# Patient Record
Sex: Female | Born: 1975 | Race: Black or African American | Hispanic: No | Marital: Single | Smoking: Current every day smoker
Health system: Southern US, Community
[De-identification: ages and names within clinical notes are randomized; demographics above are authoritative.]

## PROBLEM LIST (undated history)

## (undated) DIAGNOSIS — E119 Type 2 diabetes mellitus without complications: Secondary | ICD-10-CM

## (undated) DIAGNOSIS — F32A Depression, unspecified: Secondary | ICD-10-CM

## (undated) DIAGNOSIS — R7303 Prediabetes: Secondary | ICD-10-CM

## (undated) DIAGNOSIS — F329 Major depressive disorder, single episode, unspecified: Secondary | ICD-10-CM

## (undated) DIAGNOSIS — N63 Unspecified lump in unspecified breast: Secondary | ICD-10-CM

## (undated) DIAGNOSIS — M51369 Other intervertebral disc degeneration, lumbar region without mention of lumbar back pain or lower extremity pain: Secondary | ICD-10-CM

## (undated) DIAGNOSIS — J42 Unspecified chronic bronchitis: Secondary | ICD-10-CM

## (undated) DIAGNOSIS — M75110 Incomplete rotator cuff tear or rupture of unspecified shoulder, not specified as traumatic: Secondary | ICD-10-CM

## (undated) DIAGNOSIS — M5136 Other intervertebral disc degeneration, lumbar region: Secondary | ICD-10-CM

## (undated) DIAGNOSIS — F419 Anxiety disorder, unspecified: Secondary | ICD-10-CM

## (undated) DIAGNOSIS — M5126 Other intervertebral disc displacement, lumbar region: Secondary | ICD-10-CM

## (undated) DIAGNOSIS — G43909 Migraine, unspecified, not intractable, without status migrainosus: Secondary | ICD-10-CM

## (undated) DIAGNOSIS — I10 Essential (primary) hypertension: Secondary | ICD-10-CM

## (undated) HISTORY — DX: Prediabetes: R73.03

## (undated) HISTORY — DX: Depression, unspecified: F32.A

## (undated) HISTORY — PX: TUBAL LIGATION: SHX77

## (undated) HISTORY — DX: Other intervertebral disc degeneration, lumbar region without mention of lumbar back pain or lower extremity pain: M51.369

## (undated) HISTORY — DX: Major depressive disorder, single episode, unspecified: F32.9

## (undated) HISTORY — DX: Other intervertebral disc degeneration, lumbar region: M51.36

## (undated) HISTORY — PX: SALPINGECTOMY: SHX328

## (undated) HISTORY — DX: Incomplete rotator cuff tear or rupture of unspecified shoulder, not specified as traumatic: M75.110

## (undated) HISTORY — PX: ANKLE SURGERY: SHX546

## (undated) HISTORY — DX: Other intervertebral disc displacement, lumbar region: M51.26

## (undated) HISTORY — PX: BREAST CYST ASPIRATION: SHX578

## (undated) HISTORY — PX: OTHER SURGICAL HISTORY: SHX169

## (undated) HISTORY — DX: Migraine, unspecified, not intractable, without status migrainosus: G43.909

---

## 2004-09-28 ENCOUNTER — Emergency Department: Payer: Self-pay | Admitting: General Practice

## 2004-10-29 ENCOUNTER — Emergency Department: Payer: Self-pay | Admitting: Unknown Physician Specialty

## 2004-11-02 ENCOUNTER — Ambulatory Visit: Payer: Self-pay | Admitting: Obstetrics and Gynecology

## 2004-11-16 ENCOUNTER — Ambulatory Visit: Payer: Self-pay | Admitting: Obstetrics and Gynecology

## 2005-02-25 ENCOUNTER — Emergency Department: Payer: Self-pay | Admitting: Emergency Medicine

## 2005-02-28 ENCOUNTER — Emergency Department: Payer: Self-pay | Admitting: Emergency Medicine

## 2005-03-13 ENCOUNTER — Inpatient Hospital Stay: Payer: Self-pay | Admitting: General Practice

## 2005-04-09 ENCOUNTER — Emergency Department: Payer: Self-pay | Admitting: General Practice

## 2005-07-16 ENCOUNTER — Emergency Department: Payer: Self-pay | Admitting: Emergency Medicine

## 2006-01-06 ENCOUNTER — Emergency Department: Payer: Self-pay | Admitting: Emergency Medicine

## 2006-02-12 ENCOUNTER — Emergency Department: Payer: Self-pay | Admitting: Emergency Medicine

## 2006-03-28 ENCOUNTER — Emergency Department: Payer: Self-pay | Admitting: General Practice

## 2006-06-23 ENCOUNTER — Emergency Department: Payer: Self-pay | Admitting: Emergency Medicine

## 2006-06-27 ENCOUNTER — Emergency Department: Payer: Self-pay | Admitting: Emergency Medicine
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
## 2007-01-31 ENCOUNTER — Emergency Department: Payer: Self-pay | Admitting: Emergency Medicine

## 2007-04-27 ENCOUNTER — Emergency Department: Payer: Self-pay

## 2007-05-01 ENCOUNTER — Emergency Department: Payer: Self-pay

## 2007-05-03 ENCOUNTER — Inpatient Hospital Stay: Payer: Self-pay

## 2007-05-03 ENCOUNTER — Ambulatory Visit: Payer: Self-pay | Admitting: Unknown Physician Specialty

## 2007-07-13 ENCOUNTER — Emergency Department: Payer: Self-pay | Admitting: Emergency Medicine

## 2007-08-28 ENCOUNTER — Emergency Department: Payer: Self-pay | Admitting: Emergency Medicine

## 2007-08-29 ENCOUNTER — Emergency Department: Payer: Self-pay | Admitting: Emergency Medicine

## 2007-11-29 ENCOUNTER — Emergency Department: Payer: Self-pay | Admitting: Emergency Medicine

## 2007-12-18 ENCOUNTER — Emergency Department: Payer: Self-pay | Admitting: Emergency Medicine

## 2007-12-26 ENCOUNTER — Emergency Department: Payer: Self-pay | Admitting: Unknown Physician Specialty

## 2008-02-11 ENCOUNTER — Emergency Department: Payer: Self-pay | Admitting: Emergency Medicine

## 2008-07-07 ENCOUNTER — Emergency Department: Payer: Self-pay | Admitting: Emergency Medicine

## 2008-08-06 ENCOUNTER — Emergency Department: Payer: Self-pay | Admitting: Emergency Medicine

## 2008-09-08 ENCOUNTER — Emergency Department: Payer: Self-pay | Admitting: Emergency Medicine

## 2008-09-22 ENCOUNTER — Emergency Department: Payer: Self-pay | Admitting: Emergency Medicine

## 2008-10-04 ENCOUNTER — Emergency Department: Payer: Self-pay | Admitting: Emergency Medicine

## 2008-10-29 ENCOUNTER — Emergency Department: Payer: Self-pay | Admitting: Emergency Medicine

## 2008-10-30 ENCOUNTER — Emergency Department: Payer: Self-pay | Admitting: Emergency Medicine

## 2008-12-31 ENCOUNTER — Emergency Department: Payer: Self-pay | Admitting: Emergency Medicine

## 2009-02-17 ENCOUNTER — Emergency Department: Payer: Self-pay | Admitting: Emergency Medicine

## 2009-04-28 ENCOUNTER — Emergency Department: Payer: Self-pay | Admitting: Emergency Medicine

## 2009-05-01 ENCOUNTER — Emergency Department: Payer: Self-pay | Admitting: Emergency Medicine

## 2009-06-08 ENCOUNTER — Emergency Department: Payer: Self-pay | Admitting: Emergency Medicine

## 2009-06-24 ENCOUNTER — Emergency Department: Payer: Self-pay | Admitting: Emergency Medicine

## 2009-08-05 ENCOUNTER — Emergency Department: Payer: Self-pay | Admitting: Emergency Medicine

## 2009-09-06 ENCOUNTER — Emergency Department: Payer: Self-pay | Admitting: Emergency Medicine

## 2010-01-22 ENCOUNTER — Emergency Department: Payer: Self-pay | Admitting: Emergency Medicine

## 2010-02-02 ENCOUNTER — Emergency Department: Payer: Self-pay | Admitting: Emergency Medicine

## 2010-02-14 ENCOUNTER — Emergency Department: Payer: Self-pay | Admitting: Internal Medicine

## 2010-02-23 ENCOUNTER — Emergency Department: Payer: Self-pay | Admitting: Emergency Medicine

## 2010-02-26 ENCOUNTER — Emergency Department: Payer: Self-pay | Admitting: Emergency Medicine

## 2010-03-10 ENCOUNTER — Emergency Department: Payer: Self-pay | Admitting: Internal Medicine

## 2010-04-21 ENCOUNTER — Emergency Department: Payer: Self-pay | Admitting: Emergency Medicine

## 2011-01-01 ENCOUNTER — Emergency Department: Payer: Self-pay | Admitting: Emergency Medicine

## 2011-03-25 ENCOUNTER — Emergency Department: Payer: Self-pay | Admitting: Unknown Physician Specialty

## 2011-06-22 ENCOUNTER — Emergency Department: Payer: Self-pay | Admitting: Emergency Medicine
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
## 2012-05-03 ENCOUNTER — Emergency Department: Payer: Self-pay | Admitting: Emergency Medicine

## 2012-07-19 ENCOUNTER — Emergency Department: Payer: Self-pay | Admitting: Emergency Medicine

## 2012-09-25 ENCOUNTER — Emergency Department: Payer: Self-pay | Admitting: Emergency Medicine

## 2012-10-27 ENCOUNTER — Ambulatory Visit: Payer: Self-pay | Admitting: Nurse Practitioner

## 2013-01-18 ENCOUNTER — Emergency Department: Payer: Self-pay | Admitting: Emergency Medicine

## 2013-02-12 ENCOUNTER — Emergency Department: Payer: Self-pay | Admitting: Emergency Medicine

## 2013-07-19 ENCOUNTER — Emergency Department: Payer: Self-pay | Admitting: Emergency Medicine

## 2013-08-07 ENCOUNTER — Emergency Department: Payer: Self-pay | Admitting: Emergency Medicine

## 2013-09-04 ENCOUNTER — Emergency Department: Payer: Self-pay | Admitting: Emergency Medicine

## 2013-09-04 LAB — URINALYSIS, COMPLETE
Bacteria: NONE SEEN
Bilirubin,UR: NEGATIVE
Glucose,UR: NEGATIVE mg/dL (ref 0–75)
Ketone: NEGATIVE
Nitrite: NEGATIVE
Specific Gravity: 1.027 (ref 1.003–1.030)
Squamous Epithelial: 4
WBC UR: 4 /HPF (ref 0–5)

## 2013-09-07 ENCOUNTER — Emergency Department: Payer: Self-pay | Admitting: Emergency Medicine

## 2013-09-13 ENCOUNTER — Emergency Department: Payer: Self-pay | Admitting: Emergency Medicine

## 2013-12-10 ENCOUNTER — Emergency Department: Payer: Self-pay | Admitting: Emergency Medicine

## 2014-04-10 ENCOUNTER — Emergency Department: Payer: Self-pay | Admitting: Internal Medicine

## 2014-04-10 LAB — CBC
HCT: 41.2 % (ref 35.0–47.0)
HGB: 13.3 g/dL (ref 12.0–16.0)
MCH: 29.6 pg (ref 26.0–34.0)
MCHC: 32.3 g/dL (ref 32.0–36.0)
MCV: 92 fL (ref 80–100)
PLATELETS: 443 10*3/uL — AB (ref 150–440)
RBC: 4.51 10*6/uL (ref 3.80–5.20)
RDW: 12.8 % (ref 11.5–14.5)
WBC: 10.6 10*3/uL (ref 3.6–11.0)

## 2014-04-10 LAB — BASIC METABOLIC PANEL
Anion Gap: 3 — ABNORMAL LOW (ref 7–16)
BUN: 8 mg/dL (ref 7–18)
CALCIUM: 8.4 mg/dL — AB (ref 8.5–10.1)
Chloride: 107 mmol/L (ref 98–107)
Co2: 27 mmol/L (ref 21–32)
Creatinine: 0.85 mg/dL (ref 0.60–1.30)
EGFR (African American): >60
EGFR (Non-African Amer.): >60
Glucose: 102 mg/dL — ABNORMAL HIGH (ref 65–99)
Osmolality: 272 (ref 275–301)
POTASSIUM: 4.2 mmol/L (ref 3.5–5.1)
Sodium: 137 mmol/L (ref 136–145)

## 2014-04-10 LAB — TROPONIN I: Troponin-I: <0.02 ng/mL

## 2014-04-25 DIAGNOSIS — M75101 Unspecified rotator cuff tear or rupture of right shoulder, not specified as traumatic: Secondary | ICD-10-CM | POA: Insufficient documentation

## 2014-04-25 DIAGNOSIS — M67919 Unspecified disorder of synovium and tendon, unspecified shoulder: Secondary | ICD-10-CM | POA: Insufficient documentation

## 2014-05-15 ENCOUNTER — Emergency Department: Payer: Self-pay | Admitting: Emergency Medicine

## 2014-05-22 IMAGING — CR SACRUM AND COCCYX - 2+ VIEW
1 series · 5 of 5 positions shown · non-contrast
Comparison: none

REASON FOR EXAM: PAIN POST FALL
COMMENTS:

PROCEDURE:     DXR - DXR SACRUM AND COCCYX  - September 25, 2012  [DATE]
RESULT:     Comparison: None.

[Series 1: ap · 0.17mm/px · 5 of 5 slices shown]
[im 1/5]
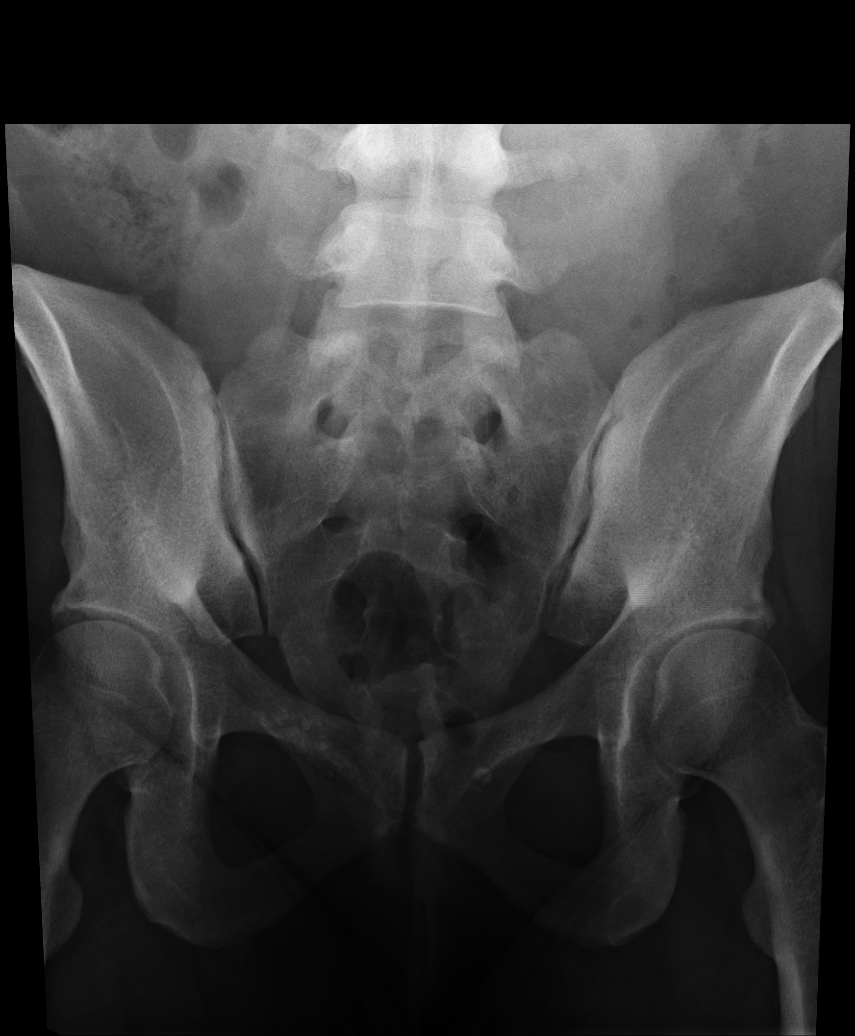
[im 2/5]
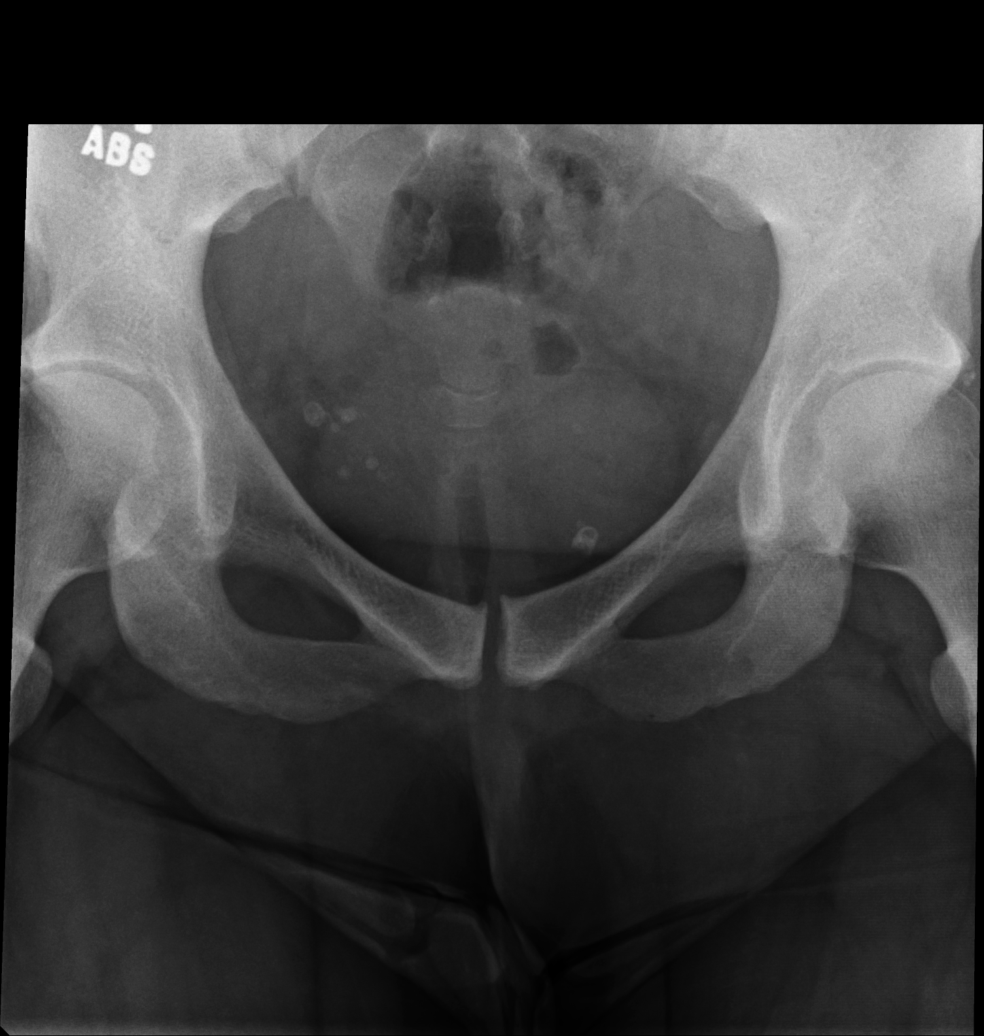
[im 3/5]
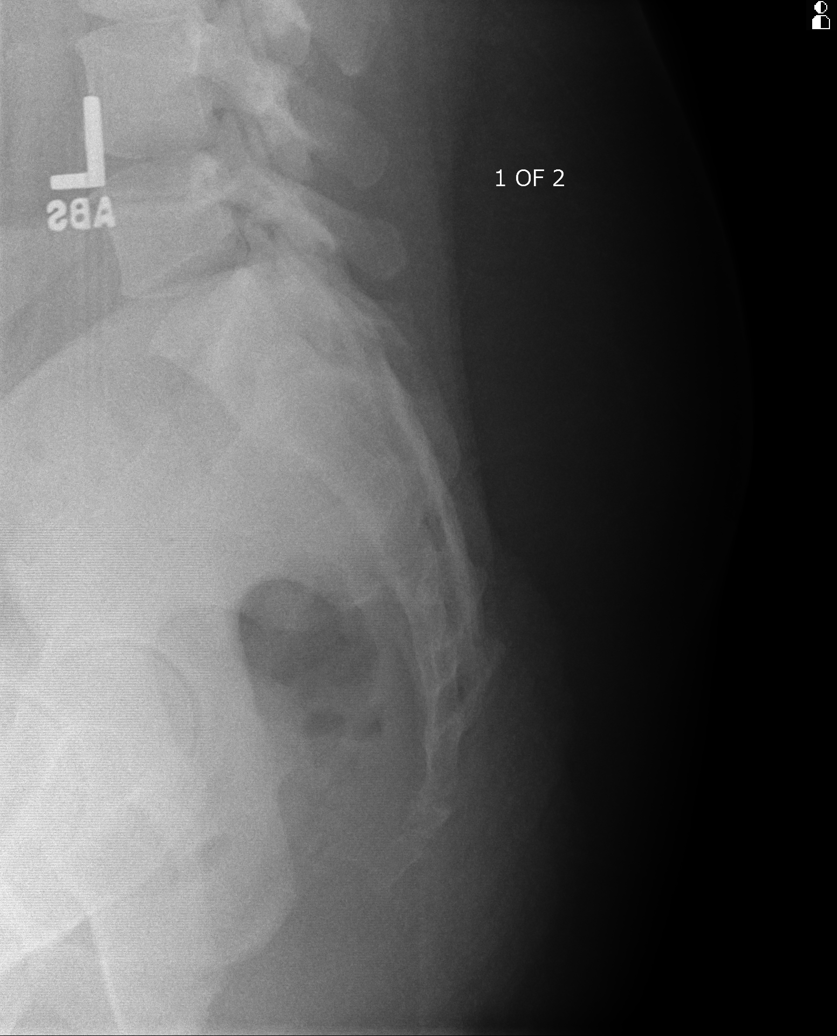
[im 4/5]
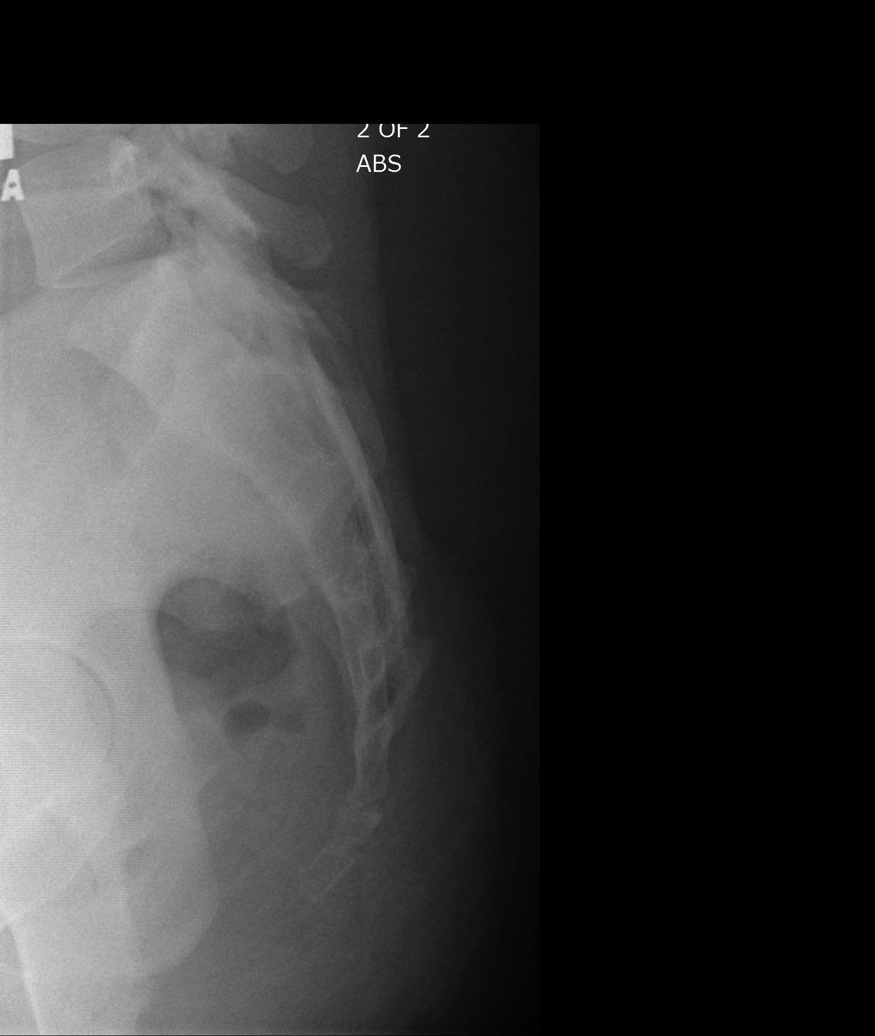
[im 5/5]
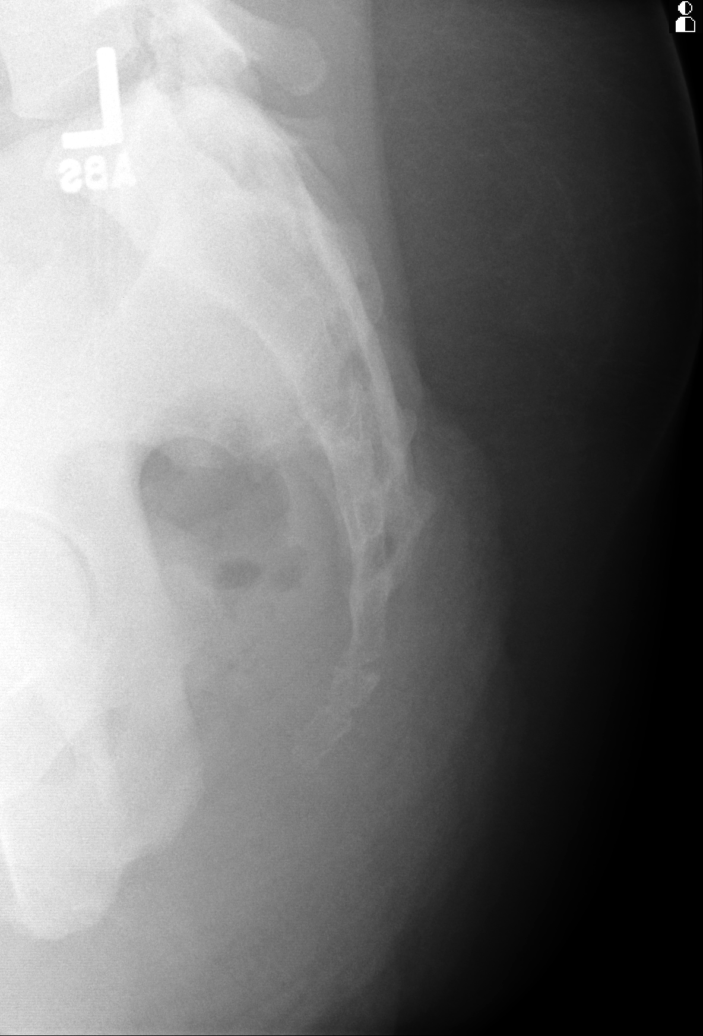

[5 of 5 positions shown; findings below may reference images not displayed]

FINDINGS: The sacroiliac joints are patent. No displaced fracture seen.
IMPRESSION: No displaced fracture seen.

[REDACTED]

## 2014-05-29 ENCOUNTER — Emergency Department: Payer: Self-pay | Admitting: Emergency Medicine

## 2014-07-29 ENCOUNTER — Emergency Department: Payer: Self-pay | Admitting: Emergency Medicine

## 2014-09-03 ENCOUNTER — Emergency Department: Payer: Self-pay | Admitting: Emergency Medicine

## 2014-09-03 LAB — URINALYSIS, COMPLETE
BILIRUBIN, UR: NEGATIVE
BLOOD: NEGATIVE
GLUCOSE, UR: NEGATIVE mg/dL (ref 0–75)
KETONE: NEGATIVE
NITRITE: NEGATIVE
Ph: 6 (ref 4.5–8.0)
Protein: NEGATIVE
Specific Gravity: 1.018 (ref 1.003–1.030)
Squamous Epithelial: 1

## 2014-09-03 LAB — COMPREHENSIVE METABOLIC PANEL
Albumin: 3.2 g/dL — ABNORMAL LOW (ref 3.4–5.0)
Alkaline Phosphatase: 116 U/L
Anion Gap: 6 — ABNORMAL LOW (ref 7–16)
BILIRUBIN TOTAL: 0.2 mg/dL (ref 0.2–1.0)
BUN: 8 mg/dL (ref 7–18)
Calcium, Total: 8.7 mg/dL (ref 8.5–10.1)
Chloride: 107 mmol/L (ref 98–107)
Co2: 27 mmol/L (ref 21–32)
Creatinine: 1.07 mg/dL (ref 0.60–1.30)
EGFR (Non-African Amer.): >60
GLUCOSE: 118 mg/dL — AB (ref 65–99)
Osmolality: 279 (ref 275–301)
POTASSIUM: 4.1 mmol/L (ref 3.5–5.1)
SGOT(AST): 26 U/L (ref 15–37)
SGPT (ALT): 27 U/L
Sodium: 140 mmol/L (ref 136–145)
Total Protein: 6.9 g/dL (ref 6.4–8.2)

## 2014-09-03 LAB — CBC WITH DIFFERENTIAL/PLATELET
Basophil #: 0.2 10*3/uL — ABNORMAL HIGH (ref 0.0–0.1)
Basophil %: 1.2 %
EOS PCT: 2 %
Eosinophil #: 0.2 10*3/uL (ref 0.0–0.7)
HCT: 43.1 % (ref 35.0–47.0)
HGB: 13.7 g/dL (ref 12.0–16.0)
LYMPHS ABS: 2.9 10*3/uL (ref 1.0–3.6)
Lymphocyte %: 23.1 %
MCH: 29.5 pg (ref 26.0–34.0)
MCHC: 31.7 g/dL — ABNORMAL LOW (ref 32.0–36.0)
MCV: 93 fL (ref 80–100)
MONO ABS: 0.6 x10 3/mm (ref 0.2–0.9)
Monocyte %: 5.2 %
Neutrophil #: 8.6 10*3/uL — ABNORMAL HIGH (ref 1.4–6.5)
Neutrophil %: 68.5 %
PLATELETS: 554 10*3/uL — AB (ref 150–440)
RBC: 4.65 10*6/uL (ref 3.80–5.20)
RDW: 13.4 % (ref 11.5–14.5)
WBC: 12.5 10*3/uL — AB (ref 3.6–11.0)

## 2014-09-17 ENCOUNTER — Emergency Department: Payer: Self-pay | Admitting: Emergency Medicine

## 2014-09-17 LAB — COMPREHENSIVE METABOLIC PANEL
ALBUMIN: 3.1 g/dL — AB (ref 3.4–5.0)
ALT: 19 U/L
Alkaline Phosphatase: 100 U/L
Anion Gap: 6 — ABNORMAL LOW (ref 7–16)
BILIRUBIN TOTAL: 0.3 mg/dL (ref 0.2–1.0)
BUN: 6 mg/dL — AB (ref 7–18)
CO2: 24 mmol/L (ref 21–32)
Calcium, Total: 8.3 mg/dL — ABNORMAL LOW (ref 8.5–10.1)
Chloride: 110 mmol/L — ABNORMAL HIGH (ref 98–107)
Creatinine: 0.83 mg/dL (ref 0.60–1.30)
EGFR (African American): >60
EGFR (Non-African Amer.): >60
GLUCOSE: 120 mg/dL — AB (ref 65–99)
OSMOLALITY: 278 (ref 275–301)
Potassium: 4 mmol/L (ref 3.5–5.1)
SGOT(AST): 24 U/L (ref 15–37)
Sodium: 140 mmol/L (ref 136–145)
TOTAL PROTEIN: 6.8 g/dL (ref 6.4–8.2)

## 2014-09-17 LAB — URINALYSIS, COMPLETE
BILIRUBIN, UR: NEGATIVE
Glucose,UR: NEGATIVE mg/dL (ref 0–75)
KETONE: NEGATIVE
Nitrite: NEGATIVE
Ph: 5 (ref 4.5–8.0)
Protein: 30
SPECIFIC GRAVITY: 1.02 (ref 1.003–1.030)
Squamous Epithelial: 1
WBC UR: 6 /HPF (ref 0–5)

## 2014-09-17 LAB — CBC
HCT: 41.4 % (ref 35.0–47.0)
HGB: 13.4 g/dL (ref 12.0–16.0)
MCH: 29.9 pg (ref 26.0–34.0)
MCHC: 32.4 g/dL (ref 32.0–36.0)
MCV: 92 fL (ref 80–100)
Platelet: 437 10*3/uL (ref 150–440)
RBC: 4.49 10*6/uL (ref 3.80–5.20)
RDW: 13.4 % (ref 11.5–14.5)
WBC: 10.4 10*3/uL (ref 3.6–11.0)

## 2014-09-17 LAB — LIPASE, BLOOD: Lipase: 103 U/L (ref 73–393)

## 2014-10-23 ENCOUNTER — Emergency Department: Payer: Self-pay | Admitting: Emergency Medicine

## 2014-11-05 ENCOUNTER — Emergency Department: Payer: Self-pay | Admitting: Student

## 2014-12-17 ENCOUNTER — Emergency Department: Payer: Self-pay | Admitting: Emergency Medicine

## 2015-01-26 ENCOUNTER — Emergency Department
Admission: EM | Admit: 2015-01-26 | Discharge: 2015-01-26 | Disposition: A | Discharge status: Home / Self Care | Payer: Medicaid Other | Attending: Emergency Medicine | Admitting: Emergency Medicine

## 2015-01-26 ENCOUNTER — Encounter: Payer: Self-pay | Admitting: Emergency Medicine

## 2015-01-26 DIAGNOSIS — Z720 Tobacco use: Secondary | ICD-10-CM | POA: Diagnosis not present

## 2015-01-26 DIAGNOSIS — E119 Type 2 diabetes mellitus without complications: Secondary | ICD-10-CM | POA: Diagnosis not present

## 2015-01-26 DIAGNOSIS — K029 Dental caries, unspecified: Secondary | ICD-10-CM | POA: Insufficient documentation

## 2015-01-26 DIAGNOSIS — I10 Essential (primary) hypertension: Secondary | ICD-10-CM | POA: Insufficient documentation

## 2015-01-26 DIAGNOSIS — K088 Other specified disorders of teeth and supporting structures: Secondary | ICD-10-CM | POA: Diagnosis present

## 2015-01-26 HISTORY — DX: Type 2 diabetes mellitus without complications: E11.9

## 2015-01-26 HISTORY — DX: Essential (primary) hypertension: I10

## 2015-01-26 MED ORDER — LIDOCAINE VISCOUS 2 % MT SOLN: 20.0000 mL | OROMUCOSAL | Status: DC | PRN

## 2015-01-26 MED ORDER — IBUPROFEN 800 MG PO TABS: 800.0000 mg | ORAL_TABLET | Freq: Three times a day (TID) | ORAL | Status: DC | PRN

## 2015-01-26 MED ORDER — HYDROCODONE-ACETAMINOPHEN 5-325 MG PO TABS: 1.0000 | ORAL_TABLET | Freq: Four times a day (QID) | ORAL | Status: DC | PRN

## 2015-01-26 MED ORDER — AMOXICILLIN 500 MG PO TABS
500.0000 mg | ORAL_TABLET | Freq: Three times a day (TID) | ORAL | Status: DC
Start: 2015-01-26 — End: 2015-07-14

## 2015-01-26 NOTE — Discharge Instructions (Signed)
Dental Caries °Dental caries (also called tooth decay) is the most common oral disease. It can occur at any age but is more common in children and young adults.  °HOW DENTAL CARIES DEVELOPS  °The process of decay begins when bacteria and foods (particularly sugars and starches) combine in your mouth to produce plaque. Plaque is a substance that sticks to the hard, outer surface of a tooth (enamel). The bacteria in plaque produce acids that attack enamel. These acids may also attack the root surface of a tooth (cementum) if it is exposed. Repeated attacks dissolve these surfaces and create holes in the tooth (cavities). If left untreated, the acids destroy the other layers of the tooth.  °RISK FACTORS °· Frequent sipping of sugary beverages.   °· Frequent snacking on sugary and starchy foods, especially those that easily get stuck in the teeth.   °· Poor oral hygiene.   °· Dry mouth.   °· Substance abuse such as methamphetamine abuse.   °· Broken or poor-fitting dental restorations.   °· Eating disorders.   °· Gastroesophageal reflux disease (GERD).   °· Certain radiation treatments to the head and neck. °SYMPTOMS °In the early stages of dental caries, symptoms are seldom present. Sometimes white, chalky areas may be seen on the enamel or other tooth layers. In later stages, symptoms may include: °· Pits and holes on the enamel. °· Toothache after sweet, hot, or cold foods or drinks are consumed. °· Pain around the tooth. °· Swelling around the tooth. °DIAGNOSIS  °Most of the time, dental caries is detected during a regular dental checkup. A diagnosis is made after a thorough medical and dental history is taken and the surfaces of your teeth are checked for signs of dental caries. Sometimes special instruments, such as lasers, are used to check for dental caries. Dental X-ray exams may be taken so that areas not visible to the eye (such as between the contact areas of the teeth) can be checked for cavities.    °TREATMENT  °If dental caries is in its early stages, it may be reversed with a fluoride treatment or an application of a remineralizing agent at the dental office. Thorough brushing and flossing at home is needed to aid these treatments. If it is in its later stages, treatment depends on the location and extent of tooth destruction:  °· If a small area of the tooth has been destroyed, the destroyed area will be removed and cavities will be filled with a material such as gold, silver amalgam, or composite resin.   °· If a large area of the tooth has been destroyed, the destroyed area will be removed and a cap (crown) will be fitted over the remaining tooth structure.   °· If the center part of the tooth (pulp) is affected, a procedure called a root canal will be needed before a filling or crown can be placed.   °· If most of the tooth has been destroyed, the tooth may need to be pulled (extracted). °HOME CARE INSTRUCTIONS °You can prevent, stop, or reverse dental caries at home by practicing good oral hygiene. Good oral hygiene includes: °· Thoroughly cleaning your teeth at least twice a day with a toothbrush and dental floss.   °· Using a fluoride toothpaste. A fluoride mouth rinse may also be used if recommended by your dentist or health care provider.   °· Restricting the amount of sugary and starchy foods and sugary liquids you consume.   °· Avoiding frequent snacking on these foods and sipping of these liquids.   °· Keeping regular visits with   a dentist for checkups and cleanings. PREVENTION   Practice good oral hygiene.  Consider a dental sealant. A dental sealant is a coating material that is applied by your dentist to the pits and grooves of teeth. The sealant prevents food from being trapped in them. It may protect the teeth for several years.  Ask about fluoride supplements if you live in a community without fluorinated water or with water that has a low fluoride content. Use fluoride supplements  as directed by your dentist or health care provider.  Allow fluoride varnish applications to teeth if directed by your dentist or health care provider. Document Released: 06/05/2002 Document Revised: 01/28/2014 Document Reviewed: 09/15/2012 [facility] Patient Information 2015 [place], [place]. This information is not intended to replace advice given to you by your health care provider. Make sure you discuss any questions you have with your health care provider.  OPTIONS FOR DENTAL FOLLOW UP CARE  [facility] and [facility] [email or web address] [phone])  [facility] [phone])  [facility] [phone])  [facility] [phone])  [facility] [phone]) This clinic caters to the indigent population and is on a lottery system. Location: [facility], [place], [place], [place] [facility] Hours: Wednesdays from 6pm - 9pm, patients seen by a lottery system. For dates, call or go to [email or web address] Services: Cleanings, fillings and simple extractions. Payment Options: DENTAL WORK IS FREE OF CHARGE. Bring proof of income or support. Best way to get seen: Arrive at 5:15 pm - this is a lottery, NOT first come/first serve, so arriving earlier will not increase your chances of being seen.     [facility] [phone] Select option 1 for emergencies   Location: [facility], [place], [address] [place] [facility] Hours: No walk-ins accepted - call the day before to schedule an appointment. Check in times are 9:30 am and 1:30 pm. Services: Simple extractions, temporary fillings, pulpectomy/pulp debridement, uncomplicated abscess drainage. Payment Options: PAYMENT IS DUE AT THE TIME OF SERVICE.  Fee  is usually $100-200, additional surgical procedures (e.g. abscess drainage) may be extra. Cash, checks, Visa/MasterCard accepted.  Can file Medicaid if patient is covered for dental - patient should call case worker to check. No discount for [facility] patients. Best way to get seen: MUST call the day before and get onto the schedule. Can usually be seen the next 1-2 days. No walk-ins accepted.     [facility] [phone]   Location: [place], [place] [facility] Hours: M, W, Th, F 8am or 1:30pm, Tues 9a or 1:30 - first come/first served. Services: Simple extractions, temporary fillings, uncomplicated abscess drainage.  You do not need to be an [facility] resident. Payment Options: PAYMENT IS DUE AT THE TIME OF SERVICE. Dental insurance, otherwise sliding scale - bring proof of income or support. Depending on income and treatment needed, cost is usually $50-200. Best way to get seen: Arrive early as it is first come/first served.     [facility] [phone]   Location: [place] [facility] Hours: Mon-Thu 8a-5p Services: Most basic dental services including extractions and fillings. Payment Options: PAYMENT IS DUE AT THE TIME OF SERVICE. Sliding scale, up to 50% off - bring proof if income or support. Medicaid with dental option accepted. Best way to  get seen: Call to schedule an appointment, can usually be seen within 2 weeks OR they will try to see walk-ins - show up at [place] or 2p (you may have to wait).     [facility] [place] RESIDENTS ONLY   Location: [facility], [place] [address], [place], [place] [address] Clinic Hours: By appointment only. Monday - Thursday 8am-5pm, Friday 8am-12pm Services: Cleanings, fillings, extractions. Payment Options: PAYMENT IS DUE AT THE TIME OF SERVICE. Cash, Visa or MasterCard.  Sliding scale - $30 minimum per service. Best way to get seen: Come in to office, complete packet and make an appointment - need proof of income or support monies for each household member and proof of [facility] residence. Usually takes about a month to get in.     [facility] [phone]   Location: [address] [place] [facility] Hours: Walk-in Urgent Care Dental Services are offered Monday-Friday mornings only. The numbers of emergencies accepted daily is limited to the number of providers available. Maximum 15 - Mondays, Wednesdays & Thursdays Maximum 10 - Tuesdays & Fridays Services: You do not need to be a [facility] resident to be seen for a dental emergency. Emergencies are defined as pain, swelling, abnormal bleeding, or dental trauma. Walkins will receive x-rays if needed. NOTE: Dental cleaning is not an emergency. Payment Options: PAYMENT IS DUE AT THE TIME OF SERVICE. Minimum co-pay is $40.00 for uninsured patients. Minimum co-pay is $3.00 for Medicaid with dental coverage. Dental Insurance is accepted and must be presented at time of visit. Medicare does not cover dental. Forms of payment: Cash, credit card, checks. Best way to get seen: If not previously registered with the clinic, walk-in dental registration begins at 7:15 am and is on a first come/first serve basis. If previously registered with the clinic, call to make an appointment.     [facility] [place] ONLY   Location: [address], [place], [place] Clinic Hours: Mon-Thu 10a-2p Services: Extractions only! Payment Options: FREE (donations accepted) - bring proof of income or support Best way to get seen: Call and schedule an appointment OR come at 8am on the 1st Monday of every month (except for holidays) when it is first come/first served.     [facility] [phone]   Location: [place]  Hours: Friday mornings Services, Payment Options, Best way to get seen: Call for info

## 2015-01-26 NOTE — ED Provider Notes (Signed)
[facility] Emergency Department Provider Note    ____________________________________________  Time seen: 1240   I have reviewed the triage vital signs and the nursing notes.   HISTORY  Chief Complaint Dental Pain    HPI Brenda Simmons is a 39 y.o. female who complains of having pains to her right lower tooth which she fractured about 6 months ago. Complains that her jaw and tooth are hurting more onset about 1 day ago suddenly. Denies fever chills sore throat and runny nose congestion. Positive toothache facial pain. No swollen jaw or face noted. Worsened by heat cold and food. Relieved by nothing. Has not seen a dentist recently.    Past Medical History  Diagnosis Date  . Hypertension   . Diabetes mellitus without complication     There are no active problems to display for this patient.   Past Surgical History  Procedure Laterality Date  . Other surgical history Left ankle surgery    Current Outpatient Rx  Name  Route  Sig  Dispense  Refill  . amoxicillin (AMOXIL) 500 MG tablet   Oral   Take 1 tablet (500 mg total) by mouth 3 (three) times daily.   30 tablet   0   . HYDROcodone-acetaminophen (NORCO/VICODIN) 5-325 MG per tablet   Oral   Take 1 tablet by mouth every 6 (six) hours as needed for moderate pain.   12 tablet   0   . ibuprofen (ADVIL,MOTRIN) 800 MG tablet   Oral   Take 1 tablet (800 mg total) by mouth every 8 (eight) hours as needed.   30 tablet   0   . lidocaine (XYLOCAINE) 2 % solution   Mouth/Throat   Use as directed 20 mLs in the mouth or throat as needed for mouth pain.   100 mL   0     Allergies Review of patient's allergies indicates no known allergies.  No family history on file.  Social History History  Substance Use Topics  . Smoking status: Current Every Day Smoker -- 1.00 packs/day for 15 years    Types: Cigarettes  . Smokeless tobacco: Not on file  . Alcohol Use: Not on file    Review of  Systems    Denies any recent illness I problems denies any headache. No GI issues. denies any respiratory,  cardiovascular issues at this time as well. ____________________________________________   PHYSICAL EXAM:  VITAL SIGNS: ED Triage Vitals  Enc Vitals Group     BP 01/26/15 1157 139/84 mmHg     Pulse Rate 01/26/15 1157 70     Resp --      Temp 01/26/15 1157 99.3 F (37.4 C)     Temp Source 01/26/15 1157 Oral     SpO2 01/26/15 1157 98 %     Weight 01/26/15 1157 217 lb (98.431 kg)     Height 01/26/15 1157 5\' 4"  (1.626 m)     Head Cir --      Peak Flow --      Pain Score 01/26/15 1158 9     Pain Loc --      Pain Edu? --      Excl. in [place]? --      Physical exam stress alert. Head and neck negative pain on percussion over sinuses. No cervical lymphadenopathy. Negative neck mass. Mouth throat positive dental tenderness right lower tooth #819 with widespread dental decay. Voice normal. No drooling. No airway problems. No increased secretion of saliva. Cardiovascular normal  heart sounds normal. Respiratory no respiratory distress normal breath sounds. ____________________________________________   EKG  none  ____________________________________________    RADIOLOGY   none ____________________________________________   PROCEDURES  Procedure(s) performed: None  ____________________________________________   INITIAL IMPRESSION / ASSESSMENT AND PLAN / ED COURSE  Pertinent labs & imaging results that were available during my care of the patient were reviewed by me and considered in my medical decision making (see chart for details).  Dental caries  ____________________________________________   FINAL CLINICAL IMPRESSION(S) / ED DIAGNOSES  Final diagnoses:  Initial dental caries      Arlyss Repress, PA-C 01/29/15 1706  Lavonia Drafts, MD 01/30/15 [phone]

## 2015-02-17 ENCOUNTER — Encounter: Payer: Self-pay | Admitting: Urgent Care

## 2015-02-17 DIAGNOSIS — O99331 Smoking (tobacco) complicating pregnancy, first trimester: Secondary | ICD-10-CM | POA: Insufficient documentation

## 2015-02-17 DIAGNOSIS — O24111 Pre-existing diabetes mellitus, type 2, in pregnancy, first trimester: Secondary | ICD-10-CM | POA: Insufficient documentation

## 2015-02-17 DIAGNOSIS — O209 Hemorrhage in early pregnancy, unspecified: Secondary | ICD-10-CM | POA: Diagnosis not present

## 2015-02-17 DIAGNOSIS — F17210 Nicotine dependence, cigarettes, uncomplicated: Secondary | ICD-10-CM | POA: Insufficient documentation

## 2015-02-17 DIAGNOSIS — O10011 Pre-existing essential hypertension complicating pregnancy, first trimester: Secondary | ICD-10-CM | POA: Diagnosis not present

## 2015-02-17 DIAGNOSIS — Z3A01 Less than 8 weeks gestation of pregnancy: Secondary | ICD-10-CM | POA: Insufficient documentation

## 2015-02-17 LAB — URINALYSIS COMPLETE WITH MICROSCOPIC (ARMC ONLY)
Bacteria, UA: NONE SEEN
Bilirubin Urine: NEGATIVE
Glucose, UA: NEGATIVE mg/dL
Ketones, ur: NEGATIVE mg/dL
NITRITE: NEGATIVE
PH: 5 (ref 5.0–8.0)
Protein, ur: NEGATIVE mg/dL
Specific Gravity, Urine: 1.009 (ref 1.005–1.030)

## 2015-02-17 LAB — BASIC METABOLIC PANEL
Anion gap: 5 (ref 5–15)
BUN: 8 mg/dL (ref 6–20)
CALCIUM: 8.2 mg/dL — AB (ref 8.9–10.3)
CO2: 23 mmol/L (ref 22–32)
CREATININE: 0.87 mg/dL (ref 0.44–1.00)
Chloride: 106 mmol/L (ref 101–111)
GFR calc non Af Amer: >60 mL/min (ref >60)
Glucose, Bld: 100 mg/dL — ABNORMAL HIGH (ref 65–99)
POTASSIUM: 3.7 mmol/L (ref 3.5–5.1)
SODIUM: 134 mmol/L — AB (ref 135–145)

## 2015-02-17 LAB — CBC
HCT: 38.6 % (ref 35.0–47.0)
Hemoglobin: 12.2 g/dL (ref 12.0–16.0)
MCH: 28.3 pg (ref 26.0–34.0)
MCHC: 31.7 g/dL — ABNORMAL LOW (ref 32.0–36.0)
MCV: 89.5 fL (ref 80.0–100.0)
Platelets: 415 10*3/uL (ref 150–440)
RBC: 4.31 MIL/uL (ref 3.80–5.20)
RDW: 13.6 % (ref 11.5–14.5)
WBC: 13 10*3/uL — ABNORMAL HIGH (ref 3.6–11.0)

## 2015-02-17 LAB — HCG, QUANTITATIVE, PREGNANCY: hCG, Beta Chain, Quant, S: 4911 m[IU]/mL — ABNORMAL HIGH (ref <5)

## 2015-02-17 LAB — ABO/RH
ABO/RH(D): B POS
ABO/RH(D): B POS

## 2015-02-17 LAB — POCT PREGNANCY, URINE: Preg Test, Ur: POSITIVE — AB

## 2015-02-17 NOTE — ED Notes (Addendum)
Patient presents reporting that she is spotting. Just found out today that she was "[redacted] weeks pregnant" - no prenatal care as of yet. Patient reporting that she is a [id number]. Denies pain.

## 2015-02-18 ENCOUNTER — Emergency Department
Admission: EM | Admit: 2015-02-18 | Discharge: 2015-02-18 | Discharge status: Against Medical Advice | Payer: Medicaid Other | Attending: Emergency Medicine | Admitting: Emergency Medicine

## 2015-02-18 ENCOUNTER — Telehealth: Payer: Self-pay | Admitting: Emergency Medicine

## 2015-02-18 HISTORY — DX: Anxiety disorder, unspecified: F41.9

## 2015-02-18 NOTE — ED Notes (Signed)
0200 NA when called x3

## 2015-02-18 NOTE — ED Notes (Signed)
Left message with my phone number.

## 2015-02-28 ENCOUNTER — Encounter: Payer: Self-pay | Admitting: Emergency Medicine

## 2015-02-28 ENCOUNTER — Emergency Department
Admission: EM | Admit: 2015-02-28 | Discharge: 2015-02-28 | Disposition: A | Discharge status: Home / Self Care | Payer: Medicaid Other | Attending: Emergency Medicine | Admitting: Emergency Medicine

## 2015-02-28 DIAGNOSIS — I10 Essential (primary) hypertension: Secondary | ICD-10-CM | POA: Insufficient documentation

## 2015-02-28 DIAGNOSIS — Z720 Tobacco use: Secondary | ICD-10-CM | POA: Diagnosis not present

## 2015-02-28 DIAGNOSIS — Z792 Long term (current) use of antibiotics: Secondary | ICD-10-CM | POA: Diagnosis not present

## 2015-02-28 DIAGNOSIS — R1032 Left lower quadrant pain: Secondary | ICD-10-CM | POA: Diagnosis present

## 2015-02-28 DIAGNOSIS — N3090 Cystitis, unspecified without hematuria: Secondary | ICD-10-CM | POA: Diagnosis not present

## 2015-02-28 DIAGNOSIS — R112 Nausea with vomiting, unspecified: Secondary | ICD-10-CM | POA: Diagnosis not present

## 2015-02-28 DIAGNOSIS — E119 Type 2 diabetes mellitus without complications: Secondary | ICD-10-CM | POA: Diagnosis not present

## 2015-02-28 LAB — COMPREHENSIVE METABOLIC PANEL
ALBUMIN: 3.8 g/dL (ref 3.5–5.0)
ALK PHOS: 85 U/L (ref 38–126)
ALT: 14 U/L (ref 14–54)
AST: 18 U/L (ref 15–41)
Anion gap: 4 — ABNORMAL LOW (ref 5–15)
BILIRUBIN TOTAL: 0.3 mg/dL (ref 0.3–1.2)
BUN: 10 mg/dL (ref 6–20)
CO2: 26 mmol/L (ref 22–32)
Calcium: 8.6 mg/dL — ABNORMAL LOW (ref 8.9–10.3)
Chloride: 106 mmol/L (ref 101–111)
Creatinine, Ser: 0.96 mg/dL (ref 0.44–1.00)
GFR calc Af Amer: >60 mL/min (ref >60)
GFR calc non Af Amer: >60 mL/min (ref >60)
Glucose, Bld: 116 mg/dL — ABNORMAL HIGH (ref 65–99)
Potassium: 4.2 mmol/L (ref 3.5–5.1)
Sodium: 136 mmol/L (ref 135–145)
Total Protein: 7.3 g/dL (ref 6.5–8.1)

## 2015-02-28 LAB — URINALYSIS COMPLETE WITH MICROSCOPIC (ARMC ONLY)
BILIRUBIN URINE: NEGATIVE
GLUCOSE, UA: NEGATIVE mg/dL
KETONES UR: NEGATIVE mg/dL
NITRITE: NEGATIVE
Protein, ur: 30 mg/dL — AB
Specific Gravity, Urine: 1.019 (ref 1.005–1.030)
pH: 5 (ref 5.0–8.0)

## 2015-02-28 LAB — CBC WITH DIFFERENTIAL/PLATELET
Basophils Absolute: 0.1 10*3/uL (ref 0–0.1)
Basophils Relative: 1 %
EOS PCT: 2 %
Eosinophils Absolute: 0.2 10*3/uL (ref 0–0.7)
HCT: 41.3 % (ref 35.0–47.0)
Hemoglobin: 13.3 g/dL (ref 12.0–16.0)
LYMPHS PCT: 19 %
Lymphs Abs: 2.2 10*3/uL (ref 1.0–3.6)
MCH: 29.1 pg (ref 26.0–34.0)
MCHC: 32.3 g/dL (ref 32.0–36.0)
MCV: 90.1 fL (ref 80.0–100.0)
MONO ABS: 0.6 10*3/uL (ref 0.2–0.9)
Monocytes Relative: 5 %
Neutro Abs: 8.8 10*3/uL — ABNORMAL HIGH (ref 1.4–6.5)
Neutrophils Relative %: 73 %
Platelets: 494 10*3/uL — ABNORMAL HIGH (ref 150–440)
RBC: 4.58 MIL/uL (ref 3.80–5.20)
RDW: 13.4 % (ref 11.5–14.5)
WBC: 12 10*3/uL — AB (ref 3.6–11.0)

## 2015-02-28 MED ORDER — PHENAZOPYRIDINE HCL 200 MG PO TABS: 200.0000 mg | ORAL_TABLET | Freq: Three times a day (TID) | ORAL | Status: DC | PRN

## 2015-02-28 NOTE — ED Notes (Signed)
Pt discharged home after verbalizing understanding of discharge instructions; nad noted. 

## 2015-02-28 NOTE — ED Notes (Signed)
Pt states she had an etopic pregnancy on 5/24 and had to have surgery, states about 2-3 days ago she started having LLQ abd. Pain and left flank pain, also having some nausea, denies any urinary symptoms

## 2015-02-28 NOTE — Discharge Instructions (Signed)
Urinary Tract Infection A urinary tract infection (UTI) can occur any place along the urinary tract. The tract includes the kidneys, ureters, bladder, and urethra. A type of germ called bacteria often causes a UTI. UTIs are often helped with antibiotic medicine.  HOME CARE   If given, take antibiotics as told by your doctor. Finish them even if you start to feel better.  Drink enough fluids to keep your pee (urine) clear or pale yellow.  Avoid tea, drinks with caffeine, and bubbly (carbonated) drinks.  Pee often. Avoid holding your pee in for a long time.  Pee before and after having sex (intercourse).  Wipe from front to back after you poop (bowel movement) if you are a woman. Use each tissue only once. GET HELP RIGHT AWAY IF:   You have back pain.  You have lower belly (abdominal) pain.  You have chills.  You feel sick to your stomach (nauseous).  You throw up (vomit).  Your burning or discomfort with peeing does not go away.  You have a fever.  Your symptoms are not better in 3 days. MAKE SURE YOU:   Understand these instructions.  Will watch your condition.  Will get help right away if you are not doing well or get worse. Document Released: 03/01/2008 Document Revised: 06/07/2012 Document Reviewed: 04/13/2012 [facility] Patient Information 2015 [place], [place]. This information is not intended to replace advice given to you by your health care provider. Make sure you discuss any questions you have with your health care provider.   INCREASE FLUIDS, CONTINUE TAKING AMOXIL UNTIL FINISHED FOLLOW UP WITH [facility] IF ANY PROBLEMS

## 2015-02-28 NOTE — ED Provider Notes (Signed)
[facility] Emergency Department Provider Note  ____________________________________________  Time seen: 1331  I have reviewed the triage vital signs and the nursing notes.   HISTORY  Chief Complaint Abdominal Pain   HPI Brenda Simmons is a 39 y.o. female complains of left lower quadrant pain, flank pain and some nausea. She denies any fever or chills. She had surgery at [facility] for an ectopic pregnancy on 5/24. She states that she is still quite sore and the ectopic was on the left. This was done laparoscopically. She does state that she has had some urinary frequency but no burning.Patient is currently taking amoxicillin for a sore throat for 10 days.   Past Medical History  Diagnosis Date  . Hypertension   . Diabetes mellitus without complication   . Anxiety     There are no active problems to display for this patient.   Past Surgical History  Procedure Laterality Date  . Other surgical history Left ankle surgery  . Salpingectomy Right   . Ankle surgery Left     Current Outpatient Rx  Name  Route  Sig  Dispense  Refill  . amoxicillin (AMOXIL) 500 MG tablet   Oral   Take 1 tablet (500 mg total) by mouth 3 (three) times daily.   30 tablet   0   . HYDROcodone-acetaminophen (NORCO/VICODIN) 5-325 MG per tablet   Oral   Take 1 tablet by mouth every 6 (six) hours as needed for moderate pain.   12 tablet   0   . ibuprofen (ADVIL,MOTRIN) 800 MG tablet   Oral   Take 1 tablet (800 mg total) by mouth every 8 (eight) hours as needed.   30 tablet   0   . lidocaine (XYLOCAINE) 2 % solution   Mouth/Throat   Use as directed 20 mLs in the mouth or throat as needed for mouth pain.   100 mL   0   . phenazopyridine (PYRIDIUM) 200 MG tablet   Oral   Take 1 tablet (200 mg total) by mouth 3 (three) times daily as needed for pain.   20 tablet   0     Allergies Review of patient's allergies indicates no known allergies.  No family history on  file.  Social History History  Substance Use Topics  . Smoking status: Current Every Day Smoker -- 1.00 packs/day for 15 years    Types: Cigarettes  . Smokeless tobacco: Not on file  . Alcohol Use: Yes    Review of Systems Constitutional: No fever/chills Eyes: No visual changes. ENT: No sore throat. Cardiovascular: Denies chest pain. Respiratory: Denies shortness of breath. Gastrointestinal: Positive abdominal pain.  Positive nausea, no vomiting.  No diarrhea.  . Genitourinary: Negative for dysuria. Musculoskeletal: Negative for back pain. Skin: Negative for rash. Neurological: Negative for headaches, focal weakness or numbness.  10-point ROS otherwise negative.  ____________________________________________   PHYSICAL EXAM:  VITAL SIGNS: ED Triage Vitals  Enc Vitals Group     BP 02/28/15 1101 120/84 mmHg     Pulse Rate 02/28/15 1101 68     Resp 02/28/15 1101 18     Temp 02/28/15 1101 99.1 F (37.3 C)     Temp Source 02/28/15 1101 Oral     SpO2 02/28/15 1101 97 %     Weight 02/28/15 1101 217 lb (98.431 kg)     Height 02/28/15 1101 5\' 4"  (1.626 m)     Head Cir --      Peak Flow --  Pain Score 02/28/15 1102 9     Pain Loc --      Pain Edu? --      Excl. in [place]? --     Constitutional: Alert and oriented. Well appearing and in no acute distress. Eyes: Conjunctivae are normal. PERRL. EOMI. Head: Atraumatic. Nose: No congestion/rhinnorhea. Neck: No stridor.   Cardiovascular: Normal rate, regular rhythm. Grossly normal heart sounds.  Good peripheral circulation. Respiratory: Normal respiratory effort.  No retractions. Lungs CTAB. Gastrointestinal: Soft and minimally tender left lower quadrant. Surgical sites are all healing without any signs of infection.. No distention. No abdominal bruits. No CVA tenderness. Musculoskeletal: No lower extremity tenderness nor edema.  No joint effusions. Neurologic:  Normal speech and language. No gross focal neurologic deficits  are appreciated. Speech is normal. No gait instability. Skin:  Skin is warm, dry and intact. No rash noted. Psychiatric: Mood and affect are normal. Speech and behavior are normal.  ____________________________________________   LABS (all labs ordered are listed, but only abnormal results are displayed)  Labs Reviewed  CBC WITH DIFFERENTIAL/PLATELET - Abnormal; Notable for the following:    WBC 12.0 (*)    Platelets 494 (*)    Neutro Abs 8.8 (*)    All other components within normal limits  COMPREHENSIVE METABOLIC PANEL - Abnormal; Notable for the following:    Glucose, Bld 116 (*)    Calcium 8.6 (*)    Anion gap 4 (*)    All other components within normal limits  URINALYSIS COMPLETEWITH MICROSCOPIC ([facility] ONLY) - Abnormal; Notable for the following:    Color, Urine YELLOW (*)    APPearance HAZY (*)    Hgb urine dipstick 3+ (*)    Protein, ur 30 (*)    Leukocytes, UA 1+ (*)    Bacteria, UA RARE (*)    Squamous Epithelial / LPF 6-30 (*)    All other components within normal limits    PROCEDURES  Procedure(s) performed: None  Critical Care performed: No  ____________________________________________   INITIAL IMPRESSION / ASSESSMENT AND PLAN / ED COURSE  Pertinent labs & imaging results that were available during my care of the patient were reviewed by me and considered in my medical decision making (see chart for details).  Patient is continue taking amoxicillin. She is also given a prescription for Pyridium 200 mg one 3 times a day as needed for urinary frequency. She is to follow-up on her scheduled appointment with her OB/GYN. ____________________________________________   FINAL CLINICAL IMPRESSION(S) / ED DIAGNOSES  Final diagnoses:  Cystitis      Johnn Hai, PA-C 02/28/15 1628  Nance Pear, MD 03/01/15 1544

## 2015-06-10 ENCOUNTER — Encounter: Payer: Self-pay | Admitting: Emergency Medicine

## 2015-06-10 ENCOUNTER — Emergency Department
Admission: EM | Admit: 2015-06-10 | Discharge: 2015-06-10 | Disposition: A | Discharge status: Home / Self Care | Payer: Medicaid Other | Attending: Emergency Medicine | Admitting: Emergency Medicine

## 2015-06-10 DIAGNOSIS — M5432 Sciatica, left side: Secondary | ICD-10-CM | POA: Diagnosis not present

## 2015-06-10 DIAGNOSIS — M25552 Pain in left hip: Secondary | ICD-10-CM | POA: Insufficient documentation

## 2015-06-10 DIAGNOSIS — G8929 Other chronic pain: Secondary | ICD-10-CM | POA: Insufficient documentation

## 2015-06-10 DIAGNOSIS — M545 Low back pain: Secondary | ICD-10-CM | POA: Diagnosis present

## 2015-06-10 DIAGNOSIS — E119 Type 2 diabetes mellitus without complications: Secondary | ICD-10-CM | POA: Diagnosis not present

## 2015-06-10 DIAGNOSIS — Z720 Tobacco use: Secondary | ICD-10-CM | POA: Diagnosis not present

## 2015-06-10 DIAGNOSIS — I10 Essential (primary) hypertension: Secondary | ICD-10-CM | POA: Insufficient documentation

## 2015-06-10 MED ORDER — OXYCODONE-ACETAMINOPHEN 5-325 MG PO TABS: 1.0000 | ORAL_TABLET | Freq: Four times a day (QID) | ORAL | Status: DC | PRN

## 2015-06-10 NOTE — ED Provider Notes (Signed)
[facility] Emergency Department Provider Note     Time seen: ----------------------------------------- 2:28 PM on 06/10/2015 -----------------------------------------    I have reviewed the triage vital signs and the nursing notes.   HISTORY  Chief Complaint Knee Pain and Back Pain    HPI Brenda Simmons is a 39 y.o. female who presents ER with low back pain radiates to her left hip and leg. Patient states her knees feel like he can give away at times. Sometimes she has burning tingling sensation in the left leg. She states his pain is chronic, she's been referred to a pain clinic for same. She typically just takes over-the-counter pain medication for same   Past Medical History  Diagnosis Date  . Hypertension   . Diabetes mellitus without complication   . Anxiety     There are no active problems to display for this patient.   Past Surgical History  Procedure Laterality Date  . Other surgical history Left ankle surgery  . Salpingectomy Right   . Ankle surgery Left     Allergies Review of patient's allergies indicates no known allergies.  Social History Social History  Substance Use Topics  . Smoking status: Current Every Day Smoker -- 1.00 packs/day for 15 years    Types: Cigarettes  . Smokeless tobacco: None  . Alcohol Use: Yes    Review of Systems Constitutional: Negative for fever. Eyes: Negative for visual changes. ENT: Negative for sore throat. Cardiovascular: Negative for chest pain. Respiratory: Negative for shortness of breath. Positive for cough Gastrointestinal: Negative for abdominal pain, vomiting and diarrhea. Genitourinary: Negative for dysuria. Musculoskeletal: Positive for left hip pain that radiates to the left leg Skin: Negative for rash. Neurological: Negative for headaches, focal weakness or numbness.  10-point ROS otherwise negative.  ____________________________________________   PHYSICAL  EXAM:  VITAL SIGNS: ED Triage Vitals  Enc Vitals Group     BP 06/10/15 1253 153/95 mmHg     Pulse Rate 06/10/15 1253 88     Resp 06/10/15 1253 20     Temp 06/10/15 1253 98.6 F (37 C)     Temp Source 06/10/15 1253 Oral     SpO2 06/10/15 1253 97 %     Weight 06/10/15 1253 217 lb (98.431 kg)     Height 06/10/15 1253 5\' 4"  (1.626 m)     Head Cir --      Peak Flow --      Pain Score 06/10/15 1254 9     Pain Loc --      Pain Edu? --      Excl. in [place]? --     Constitutional: Alert and oriented. Well appearing and in no distress. Eyes: Conjunctivae are normal. PERRL. Normal extraocular movements. ENT   Head: Normocephalic and atraumatic.   Nose: No congestion/rhinnorhea.   Mouth/Throat: Mucous membranes are moist.   Neck: No stridor. Cardiovascular: Normal rate, regular rhythm. Normal and symmetric distal pulses are present in all extremities. No murmurs, rubs, or gallops. Respiratory: Normal respiratory effort without tachypnea nor retractions. Breath sounds are clear and equal bilaterally. No wheezes/rales/rhonchi. Gastrointestinal: Soft and nontender. No distention. No abdominal bruits.  Musculoskeletal: Mild pain with range of motion left hip, no joint effusions. No specific tenderness, borderline straight leg raise exam on the left Neurologic:  Normal speech and language. No gross focal neurologic deficits are appreciated. Speech is normal. No gait instability. Skin:  Skin is warm, dry and intact. No rash noted.  ____________________________________________  ED COURSE:  Pertinent labs & imaging results that were available during my care of the patient were reviewed by me and considered in my medical decision making (see chart for details). Patient with acute on chronic low back pain and possibly sciatica. She does not need any further imaging at this time  ____________________________________________  FINAL ASSESSMENT AND PLAN  Acute on chronic back  pain  Plan: Patient with labs and imaging as dictated above. Patient likely with acute on chronic sciatica. She states that steroids are not helping the past. We'll prescribe a short course of Percocet to take as needed for pain.   Earleen Newport, MD   Earleen Newport, MD 06/10/15 [phone]

## 2015-06-10 NOTE — ED Notes (Signed)
Pt to ed with c/o lower back pain and left knee pain x 3 weeks, denies injury.  Pt states ambulation makes pain worse.

## 2015-06-10 NOTE — Discharge Instructions (Signed)
Back Exercises Back exercises help treat and prevent back injuries. The goal of back exercises is to increase the strength of your abdominal and back muscles and the flexibility of your back. These exercises should be started when you no longer have back pain. Back exercises include:  Pelvic Tilt. Lie on your back with your knees bent. Tilt your pelvis until the lower part of your back is against the floor. Hold this position 5 to 10 sec and repeat 5 to 10 times.  Knee to Chest. Pull first 1 knee up against your chest and hold for 20 to 30 seconds, repeat this with the other knee, and then both knees. This may be done with the other leg straight or bent, whichever feels better.  Sit-Ups or Curl-Ups. Bend your knees 90 degrees. Start with tilting your pelvis, and do a partial, slow sit-up, lifting your trunk only 30 to 45 degrees off the floor. Take at least 2 to 3 seconds for each sit-up. Do not do sit-ups with your knees out straight. If partial sit-ups are difficult, simply do the above but with only tightening your abdominal muscles and holding it as directed.  Hip-Lift. Lie on your back with your knees flexed 90 degrees. Push down with your feet and shoulders as you raise your hips a couple inches off the floor; hold for 10 seconds, repeat 5 to 10 times.  Back arches. Lie on your stomach, propping yourself up on bent elbows. Slowly press on your hands, causing an arch in your low back. Repeat 3 to 5 times. Any initial stiffness and discomfort should lessen with repetition over time.  Shoulder-Lifts. Lie face down with arms beside your body. Keep hips and torso pressed to floor as you slowly lift your head and shoulders off the floor. Do not overdo your exercises, especially in the beginning. Exercises may cause you some mild back discomfort which lasts for a few minutes; however, if the pain is more severe, or lasts for more than 15 minutes, do not continue exercises until you see your caregiver.  Improvement with exercise therapy for back problems is slow.  See your caregivers for assistance with developing a proper back exercise program. Document Released: 10/21/2004 Document Revised: 12/06/2011 Document Reviewed: 07/15/2011 ExitCare Patient Information 2015 [employer]. This information is not intended to replace advice given to you by your health care provider. Make sure you discuss any questions you have with your health care provider.   Sciatica Sciatica is pain, weakness, numbness, or tingling along the path of the sciatic nerve. The nerve starts in the lower back and runs down the back of each leg. The nerve controls the muscles in the lower leg and in the back of the knee, while also providing sensation to the back of the thigh, lower leg, and the sole of your foot. Sciatica is a symptom of another medical condition. For instance, nerve damage or certain conditions, such as a herniated disk or bone spur on the spine, pinch or put pressure on the sciatic nerve. This causes the pain, weakness, or other sensations normally associated with sciatica. Generally, sciatica only affects one side of the body. CAUSES   Herniated or slipped disc.  Degenerative disk disease.  A pain disorder involving the narrow muscle in the buttocks (piriformis syndrome).  Pelvic injury or fracture.  Pregnancy.  Tumor (rare). SYMPTOMS  Symptoms can vary from mild to very severe. The symptoms usually travel from the low back to the buttocks and down the back   of the leg. Symptoms can include:  Mild tingling or dull aches in the lower back, leg, or hip.  Numbness in the back of the calf or sole of the foot.  Burning sensations in the lower back, leg, or hip.  Sharp pains in the lower back, leg, or hip.  Leg weakness.  Severe back pain inhibiting movement. These symptoms may get worse with coughing, sneezing, laughing, or prolonged sitting or standing. Also, being overweight may worsen  symptoms. DIAGNOSIS  Your caregiver will perform a physical exam to look for common symptoms of sciatica. He or she may ask you to do certain movements or activities that would trigger sciatic nerve pain. Other tests may be performed to find the cause of the sciatica. These may include:  Blood tests.  X-rays.  Imaging tests, such as an MRI or CT scan. TREATMENT  Treatment is directed at the cause of the sciatic pain. Sometimes, treatment is not necessary and the pain and discomfort goes away on its own. If treatment is needed, your caregiver may suggest:  Over-the-counter medicines to relieve pain.  Prescription medicines, such as anti-inflammatory medicine, muscle relaxants, or narcotics.  Applying heat or ice to the painful area.  Steroid injections to lessen pain, irritation, and inflammation around the nerve.  Reducing activity during periods of pain.  Exercising and stretching to strengthen your abdomen and improve flexibility of your spine. Your caregiver may suggest losing weight if the extra weight makes the back pain worse.  Physical therapy.  Surgery to eliminate what is pressing or pinching the nerve, such as a bone spur or part of a herniated disk. HOME CARE INSTRUCTIONS   Only take over-the-counter or prescription medicines for pain or discomfort as directed by your caregiver.  Apply ice to the affected area for 20 minutes, 3-4 times a day for the first 48-72 hours. Then try heat in the same way.  Exercise, stretch, or perform your usual activities if these do not aggravate your pain.  Attend physical therapy sessions as directed by your caregiver.  Keep all follow-up appointments as directed by your caregiver.  Do not wear high heels or shoes that do not provide proper support.  Check your mattress to see if it is too soft. A firm mattress may lessen your pain and discomfort. SEEK IMMEDIATE MEDICAL CARE IF:   You lose control of your bowel or bladder  (incontinence).  You have increasing weakness in the lower back, pelvis, buttocks, or legs.  You have redness or swelling of your back.  You have a burning sensation when you urinate.  You have pain that gets worse when you lie down or awakens you at night.  Your pain is worse than you have experienced in the past.  Your pain is lasting longer than 4 weeks.  You are suddenly losing weight without reason. MAKE SURE YOU:  Understand these instructions.  Will watch your condition.  Will get help right away if you are not doing well or get worse. Document Released: 09/07/2001 Document Revised: 03/14/2012 Document Reviewed: 01/23/2012 ExitCare Patient Information 2015 [employer]. This information is not intended to replace advice given to you by your health care provider. Make sure you discuss any questions you have with your health care provider.  

## 2015-06-10 NOTE — ED Notes (Signed)
Pt presents with low back pain that radiates down left hip and leg. Pt reports she feels like her knees are going to "give way" and reports that her feet have a  burning and tingling sensation in them.

## 2015-07-14 ENCOUNTER — Ambulatory Visit
Admission: RE | Admit: 2015-07-14 | Discharge: 2015-07-14 | Disposition: A | Discharge status: Home / Self Care | Payer: Medicaid Other | Source: Ambulatory Visit | Attending: Family Medicine | Admitting: Family Medicine

## 2015-07-14 ENCOUNTER — Ambulatory Visit (INDEPENDENT_AMBULATORY_CARE_PROVIDER_SITE_OTHER): Payer: Medicaid Other | Admitting: Family Medicine

## 2015-07-14 ENCOUNTER — Encounter: Payer: Self-pay | Admitting: Family Medicine

## 2015-07-14 VITALS — BP 144/75 | HR 69 | Temp 98.5°F | Resp 19 | Ht 64.0 in | Wt 218.4 lb

## 2015-07-14 DIAGNOSIS — R7989 Other specified abnormal findings of blood chemistry: Secondary | ICD-10-CM

## 2015-07-14 DIAGNOSIS — R7303 Prediabetes: Secondary | ICD-10-CM | POA: Insufficient documentation

## 2015-07-14 DIAGNOSIS — R079 Chest pain, unspecified: Secondary | ICD-10-CM | POA: Diagnosis not present

## 2015-07-14 DIAGNOSIS — R05 Cough: Secondary | ICD-10-CM | POA: Insufficient documentation

## 2015-07-14 DIAGNOSIS — R918 Other nonspecific abnormal finding of lung field: Secondary | ICD-10-CM | POA: Diagnosis not present

## 2015-07-14 DIAGNOSIS — D473 Essential (hemorrhagic) thrombocythemia: Secondary | ICD-10-CM

## 2015-07-14 DIAGNOSIS — R053 Chronic cough: Secondary | ICD-10-CM

## 2015-07-14 LAB — POCT GLYCOSYLATED HEMOGLOBIN (HGB A1C): Hemoglobin A1C: 5.7

## 2015-07-14 LAB — GLUCOSE, POCT (MANUAL RESULT ENTRY): POC Glucose: 115 mg/dL — AB (ref 70–99)

## 2015-07-14 MED ORDER — GUAIFENESIN-CODEINE 100-10 MG/5ML PO SYRP
10.0000 mL | ORAL_SOLUTION | Freq: Four times a day (QID) | ORAL | Status: DC | PRN
Start: 2015-07-14 — End: 2015-09-11

## 2015-07-14 NOTE — Progress Notes (Signed)
Name: Brenda Simmons   MRN: [id number]    DOB: [birth year]   Date:07/14/2015       Progress Note  Subjective  Chief Complaint  Chief Complaint  Patient presents with  . Establish Care    NP  . Cough    x3-4 months, patient states worsens at night, coughing spells when she first wakes up in the morning  . Chest Pain    x2 weeks  . Diabetes   Cough This is a new problem. Episode onset: 2 months. The problem has been gradually worsening. The cough is non-productive. Associated symptoms include chest pain, headaches, a sore throat and shortness of breath. Pertinent negatives include no chills, ear congestion, fever, hemoptysis, nasal congestion, postnasal drip or weight loss. She has tried OTC cough suppressant (Tussin and Theraflu) for the symptoms. The treatment provided no relief. Her past medical history is significant for bronchitis. There is no history of asthma or COPD.    Past Medical History  Diagnosis Date  . Hypertension   . Pre-diabetes     On Metformin.  . Bulging lumbar disc     Seen [facility] Clinic at [facility]  . Partial tear of rotator cuff     Injections did not help  . Anxiety   . Depression     Seeing Mental Health Professional  . Migraines     Past Surgical History  Procedure Laterality Date  . Other surgical history Left ankle surgery  . Salpingectomy Bilateral   . Ankle surgery Left     Family History  Problem Relation Age of Onset  . Depression Mother   . Hypertension Mother   . Diabetes Mother   . Hypertension Father   . Hypertension Sister   . Depression Brother     Social History   Social History  . Marital Status: Single    Spouse Name: N/A  . Number of Children: N/A  . Years of Education: N/A   Occupational History  . Not on file.   Social History Main Topics  . Smoking status: Current Every Day Smoker -- 1.00 packs/day for 15 years    Types: Cigarettes  . Smokeless tobacco: Not on file  . Alcohol Use: Yes     Comment:  Occasionally during Holidays  . Drug Use: No  . Sexual Activity: Yes    Birth Control/ Protection: None   Other Topics Concern  . Not on file   Social History Narrative    Current outpatient prescriptions:  .  ibuprofen (ADVIL,MOTRIN) 200 MG tablet, Take 200 mg by mouth every 6 (six) hours as needed., Disp: , Rfl:  .  metFORMIN (GLUCOPHAGE) 500 MG tablet, Take 500 mg by mouth 2 (two) times daily., Disp: , Rfl:  .  propranolol (INDERAL) 40 MG tablet, Take 40 mg by mouth 2 (two) times daily., Disp: , Rfl:   No Known Allergies  Review of Systems  Constitutional: Positive for malaise/fatigue. Negative for fever, chills and weight loss.  HENT: Positive for sore throat. Negative for congestion and postnasal drip.   Respiratory: Positive for cough and shortness of breath. Negative for hemoptysis and sputum production.   Cardiovascular: Positive for chest pain. Negative for leg swelling.  Neurological: Positive for headaches.    Objective  Filed Vitals:   07/14/15 1346  BP: 144/75  Pulse: 69  Temp: 98.5 F (36.9 C)  TempSrc: Oral  Resp: 19  Height: 5\' 4"  (1.626 m)  Weight: 218 lb 6.4 oz (99.066 kg)  SpO2:  96%    Physical Exam  Constitutional: She is well-developed, well-nourished, and in no distress.  HENT:  Mouth/Throat: No posterior oropharyngeal erythema.  Cardiovascular: Normal rate and regular rhythm.   No murmur heard. Pulmonary/Chest: Effort normal and breath sounds normal. No respiratory distress. She has no wheezes. She has no rales.  Abdominal: Soft. Bowel sounds are normal. There is no tenderness.  Nursing note and vitals reviewed.  Assessment & Plan  1. Persistent dry cough Obtain chest x-ray for evaluation of cardiopulmonary abnormalities. Started patient on codeine-guaifenesin for relief. We'll check baseline labs. - DG Chest 2 View; Future - guaiFENesin-codeine (CHERATUSSIN AC) 100-10 MG/5ML syrup; Take 10 mLs by mouth 4 (four) times daily as needed for  cough.  Dispense: 200 mL; Refill: 0 - Comprehensive Metabolic Panel (CMET) - CBC with Differential  2. Chest pain, unspecified chest pain type EKG is normal sinus rhythm with some inferior changes. Suspect chest pain due to cough versus reflux. We will refer to cardiology for evaluation and reassess in one month. - EKG 12-Lead - Ambulatory referral to Cardiology  3. Pre-diabetes Patient has been informed that she has "prediabetes" by previous PCP. A1c obtained today 5.7%. Patient reassured - POCT HgB A1C - POCT Glucose (CBG)    Asad A. [facility] 07/14/2015 2:14 PM

## 2015-07-15 LAB — CBC WITH DIFFERENTIAL/PLATELET
BASOS: 0 %
Basophils Absolute: 0 10*3/uL (ref 0.0–0.2)
EOS (ABSOLUTE): 0.3 10*3/uL (ref 0.0–0.4)
Eos: 3 %
HEMOGLOBIN: 13.6 g/dL (ref 11.1–15.9)
Hematocrit: 41.1 % (ref 34.0–46.6)
IMMATURE GRANS (ABS): 0 10*3/uL (ref 0.0–0.1)
Immature Granulocytes: 0 %
Lymphocytes Absolute: 2.1 10*3/uL (ref 0.7–3.1)
Lymphs: 20 %
MCH: 29 pg (ref 26.6–33.0)
MCHC: 33.1 g/dL (ref 31.5–35.7)
MCV: 88 fL (ref 79–97)
MONOCYTES: 6 %
Monocytes Absolute: 0.7 10*3/uL (ref 0.1–0.9)
NEUTROS ABS: 7.4 10*3/uL — AB (ref 1.4–7.0)
Neutrophils: 71 %
Platelets: 457 10*3/uL — ABNORMAL HIGH (ref 150–379)
RBC: 4.69 x10E6/uL (ref 3.77–5.28)
RDW: 13.5 % (ref 12.3–15.4)
WBC: 10.5 10*3/uL (ref 3.4–10.8)

## 2015-07-15 LAB — COMPREHENSIVE METABOLIC PANEL
A/G RATIO: 1.4 (ref 1.1–2.5)
ALBUMIN: 4 g/dL (ref 3.5–5.5)
ALT: 17 IU/L (ref 0–32)
AST: 17 IU/L (ref 0–40)
Alkaline Phosphatase: 120 IU/L — ABNORMAL HIGH (ref 39–117)
BILIRUBIN TOTAL: 0.4 mg/dL (ref 0.0–1.2)
BUN / CREAT RATIO: 10 (ref 8–20)
BUN: 8 mg/dL (ref 6–20)
CHLORIDE: 99 mmol/L (ref 97–106)
CO2: 22 mmol/L (ref 18–29)
Calcium: 9.4 mg/dL (ref 8.7–10.2)
Creatinine, Ser: 0.82 mg/dL (ref 0.57–1.00)
GFR calc non Af Amer: 90 mL/min/{1.73_m2} (ref >59)
GFR, EST AFRICAN AMERICAN: 104 mL/min/{1.73_m2} (ref >59)
Globulin, Total: 2.9 g/dL (ref 1.5–4.5)
Glucose: 105 mg/dL — ABNORMAL HIGH (ref 65–99)
POTASSIUM: 4.7 mmol/L (ref 3.5–5.2)
Sodium: 136 mmol/L (ref 136–144)
Total Protein: 6.9 g/dL (ref 6.0–8.5)

## 2015-07-16 DIAGNOSIS — R7989 Other specified abnormal findings of blood chemistry: Secondary | ICD-10-CM | POA: Insufficient documentation

## 2015-07-16 NOTE — Addendum Note (Signed)
Addended byManuella Ghazi,  A A on: 07/16/2015 10:02 AM   Modules accepted: Orders

## 2015-07-23 ENCOUNTER — Encounter: Payer: Self-pay | Admitting: Oncology

## 2015-07-23 ENCOUNTER — Inpatient Hospital Stay: Payer: Medicaid Other | Attending: Oncology | Admitting: Oncology

## 2015-07-23 ENCOUNTER — Inpatient Hospital Stay: Payer: Medicaid Other

## 2015-07-23 VITALS — BP 144/93 | HR 69 | Temp 97.6°F | Ht 64.0 in | Wt 217.6 lb

## 2015-07-23 DIAGNOSIS — D473 Essential (hemorrhagic) thrombocythemia: Secondary | ICD-10-CM | POA: Diagnosis not present

## 2015-07-23 DIAGNOSIS — I10 Essential (primary) hypertension: Secondary | ICD-10-CM | POA: Insufficient documentation

## 2015-07-23 DIAGNOSIS — R05 Cough: Secondary | ICD-10-CM | POA: Diagnosis not present

## 2015-07-23 DIAGNOSIS — Z79899 Other long term (current) drug therapy: Secondary | ICD-10-CM

## 2015-07-23 DIAGNOSIS — E119 Type 2 diabetes mellitus without complications: Secondary | ICD-10-CM | POA: Insufficient documentation

## 2015-07-23 DIAGNOSIS — Z7984 Long term (current) use of oral hypoglycemic drugs: Secondary | ICD-10-CM | POA: Insufficient documentation

## 2015-07-23 DIAGNOSIS — F419 Anxiety disorder, unspecified: Secondary | ICD-10-CM | POA: Diagnosis not present

## 2015-07-23 DIAGNOSIS — R7989 Other specified abnormal findings of blood chemistry: Secondary | ICD-10-CM

## 2015-07-23 DIAGNOSIS — F329 Major depressive disorder, single episode, unspecified: Secondary | ICD-10-CM | POA: Insufficient documentation

## 2015-07-23 LAB — CBC WITH DIFFERENTIAL/PLATELET
Basophils Absolute: 0.1 10*3/uL (ref 0–0.1)
Basophils Relative: 1 %
Eosinophils Absolute: 0.3 10*3/uL (ref 0–0.7)
Eosinophils Relative: 3 %
HEMATOCRIT: 41.3 % (ref 35.0–47.0)
HEMOGLOBIN: 13.7 g/dL (ref 12.0–16.0)
LYMPHS ABS: 2.2 10*3/uL (ref 1.0–3.6)
LYMPHS PCT: 22 %
MCH: 28.9 pg (ref 26.0–34.0)
MCHC: 33.2 g/dL (ref 32.0–36.0)
MCV: 86.9 fL (ref 80.0–100.0)
MONOS PCT: 6 %
Monocytes Absolute: 0.6 10*3/uL (ref 0.2–0.9)
NEUTROS PCT: 70 %
Neutro Abs: 7 10*3/uL — ABNORMAL HIGH (ref 1.4–6.5)
Platelets: 476 10*3/uL — ABNORMAL HIGH (ref 150–440)
RBC: 4.75 MIL/uL (ref 3.80–5.20)
RDW: 14 % (ref 11.5–14.5)
WBC: 10.1 10*3/uL (ref 3.6–11.0)

## 2015-07-23 LAB — IRON AND TIBC
Iron: 57 ug/dL (ref 28–170)
Saturation Ratios: 15 % (ref 10.4–31.8)
TIBC: 389 ug/dL (ref 250–450)
UIBC: 332 ug/dL

## 2015-07-23 LAB — FERRITIN: Ferritin: 20 ng/mL (ref 11–307)

## 2015-07-23 NOTE — Progress Notes (Signed)
Pt Alert x3, able to amb w/o assistance, pt express that she is nervous abt her appt.  Mother is in waiting room.

## 2015-07-24 DIAGNOSIS — M79604 Pain in right leg: Secondary | ICD-10-CM | POA: Insufficient documentation

## 2015-07-30 ENCOUNTER — Encounter: Payer: Self-pay | Admitting: Family Medicine

## 2015-07-30 ENCOUNTER — Ambulatory Visit (INDEPENDENT_AMBULATORY_CARE_PROVIDER_SITE_OTHER): Payer: Medicaid Other | Admitting: Family Medicine

## 2015-07-30 ENCOUNTER — Other Ambulatory Visit: Payer: Self-pay | Admitting: Family Medicine

## 2015-07-30 VITALS — BP 140/80 | HR 79 | Temp 98.7°F | Resp 19 | Ht 64.0 in | Wt 219.2 lb

## 2015-07-30 DIAGNOSIS — G8929 Other chronic pain: Secondary | ICD-10-CM

## 2015-07-30 DIAGNOSIS — M542 Cervicalgia: Secondary | ICD-10-CM | POA: Diagnosis not present

## 2015-07-30 DIAGNOSIS — Z0000 Encounter for general adult medical examination without abnormal findings: Secondary | ICD-10-CM | POA: Insufficient documentation

## 2015-07-30 DIAGNOSIS — M25511 Pain in right shoulder: Secondary | ICD-10-CM | POA: Diagnosis not present

## 2015-07-30 DIAGNOSIS — M549 Dorsalgia, unspecified: Secondary | ICD-10-CM

## 2015-07-30 MED ORDER — ACETAMINOPHEN-CODEINE #3 300-30 MG PO TABS: 1.0000 | ORAL_TABLET | Freq: Three times a day (TID) | ORAL | Status: DC | PRN

## 2015-07-30 NOTE — Progress Notes (Signed)
Name: Brenda Simmons   MRN: [id number]    DOB: 03-15-1976   Date:07/30/2015       Progress Note  Subjective  Chief Complaint  Chief Complaint  Patient presents with  . Annual Exam    CPE w/pap    HPI  Pt. Is here for a Complete Physical Exam.   Past Medical History  Diagnosis Date  . Hypertension   . Pre-diabetes     On Metformin.  . Bulging lumbar disc     Seen Pain Clinic at [facility]  . Partial tear of rotator cuff     Injections did not help  . Anxiety   . Depression     Seeing Mental Health Professional  . Migraines   . Blood transfusion without reported diagnosis   . Diabetes mellitus without complication [facility])     Past Surgical History  Procedure Laterality Date  . Other surgical history Left ankle surgery  . Salpingectomy Bilateral   . Ankle surgery Left     Family History  Problem Relation Age of Onset  . Depression Mother   . Hypertension Mother   . Diabetes Mother   . Hypertension Father   . Hypertension Sister   . Depression Brother     Social History   Social History  . Marital Status: Single    Spouse Name: N/A  . Number of Children: N/A  . Years of Education: N/A   Occupational History  . Not on file.   Social History Main Topics  . Smoking status: Current Every Day Smoker -- 1.00 packs/day for 15 years    Types: Cigarettes  . Smokeless tobacco: Not on file  . Alcohol Use: Yes     Comment: Occasionally during Holidays  . Drug Use: No  . Sexual Activity: Yes    Birth Control/ Protection: None   Other Topics Concern  . Not on file   Social History Narrative     Current outpatient prescriptions:  .  guaiFENesin-codeine (CHERATUSSIN AC) 100-10 MG/5ML syrup, Take 10 mLs by mouth 4 (four) times daily as needed for cough., Disp: 200 mL, Rfl: 0 .  ibuprofen (ADVIL,MOTRIN) 200 MG tablet, Take 200 mg by mouth every 6 (six) hours as needed., Disp: , Rfl:  .  metFORMIN (GLUCOPHAGE) 500 MG tablet, Take 500 mg by mouth 2 (two) times  daily., Disp: , Rfl:  .  propranolol (INDERAL) 40 MG tablet, Take 40 mg by mouth 2 (two) times daily., Disp: , Rfl:   No Known Allergies   Review of Systems  Constitutional: Positive for malaise/fatigue. Negative for fever, chills and weight loss.  HENT: Negative for congestion, ear pain and sore throat.   Eyes: Negative for blurred vision, double vision and pain.  Respiratory: Positive for cough (cough is improved but not resolved.). Negative for shortness of breath and wheezing.   Cardiovascular: Negative for chest pain, palpitations and leg swelling.  Gastrointestinal: Positive for heartburn and nausea. Negative for vomiting, abdominal pain, diarrhea and constipation.  Genitourinary: Negative for dysuria, frequency and hematuria.  Musculoskeletal: Positive for back pain, joint pain (right shoulder pain, has a tear in rotator cuff) and neck pain (has history of 'slipped discs' and ' pinched nerve'). Negative for myalgias.  Skin: Negative for itching and rash.  Neurological: Positive for headaches. Negative for dizziness.  Psychiatric/Behavioral: Positive for depression. The patient is nervous/anxious and has insomnia.     Objective  Filed Vitals:   07/30/15 1122  BP: 140/80  Pulse: 79  Temp: 98.7 F (37.1 C)  TempSrc: Oral  Resp: 19  Height: 5\' 4"  (1.626 m)  Weight: 219 lb 3.2 oz (99.428 kg)  SpO2: 97%    Physical Exam  Constitutional: She is oriented to person, place, and time and well-developed, well-nourished, and in no distress.  Eyes: Pupils are equal, round, and reactive to light.  Neck: Neck supple.  Cardiovascular: Normal rate, regular rhythm and normal heart sounds.   No murmur heard. Pulmonary/Chest: Effort normal and breath sounds normal. She has no wheezes. Right breast exhibits mass. Right breast exhibits no inverted nipple, no nipple discharge, no skin change and no tenderness. Left breast exhibits no inverted nipple, no mass, no nipple discharge, no skin  change and no tenderness. Breasts are symmetrical.    Mobile, well-defined, oval shaped mass palpated on the 3 o' clock position in the right breast.  Abdominal: Soft. Bowel sounds are normal. There is no tenderness.  Genitourinary: Vagina normal, uterus normal, cervix normal, right adnexa normal and left adnexa normal. Cervix exhibits no lesion. Vagina exhibits normal mucosa and no exudate.  Musculoskeletal:       Cervical back: She exhibits tenderness, pain and spasm.       Back:  Neurological: She is alert and oriented to person, place, and time.  Psychiatric: Memory, affect and judgment normal.  Nursing note and vitals reviewed.    Assessment & Plan  1. Annual physical exam Obtain diagnostic mammogram for valuation of a mass in the right breast. Standard labs ordered. - Lipid Profile - Vitamin D (25 hydroxy) - TSH - Cytology - PAP - MM Digital Diagnostic Unilat R; Future  2. Chronic neck pain Patient will be referred to orthopedics for chronic neck and shoulder pain. We'll start on Tylenol 3 for pain relief - Ambulatory referral to Orthopedic Surgery - acetaminophen-codeine (TYLENOL #3) 300-30 MG tablet; Take 1 tablet by mouth every 8 (eight) hours as needed for moderate pain.  Dispense: 30 tablet; Refill: 0  3. Chronic right shoulder pain  - Ambulatory referral to Orthopedic Surgery    [facility] 07/30/2015 11:35 AM

## 2015-07-31 LAB — TSH: TSH: 0.827 u[IU]/mL (ref 0.450–4.500)

## 2015-07-31 LAB — VITAMIN D 25 HYDROXY (VIT D DEFICIENCY, FRACTURES): Vit D, 25-Hydroxy: 16 ng/mL — ABNORMAL LOW (ref 30.0–100.0)

## 2015-07-31 LAB — LIPID PANEL
CHOLESTEROL TOTAL: 166 mg/dL (ref 100–199)
Chol/HDL Ratio: 3.9 ratio units (ref 0.0–4.4)
HDL: 43 mg/dL (ref >39)
LDL Calculated: 102 mg/dL — ABNORMAL HIGH (ref 0–99)
TRIGLYCERIDES: 103 mg/dL (ref 0–149)
VLDL Cholesterol Cal: 21 mg/dL (ref 5–40)

## 2015-08-01 NOTE — Progress Notes (Signed)
[place]  [phone] [phone]) [phone]  ID: Mamie Nick OB: 11-12-1975  MR#: [id number]  YIA#:[id number]  Patient Care Team: Roselee Nova, MD as PCP - General (Family Medicine)  CHIEF COMPLAINT:  Chief Complaint  Patient presents with  . New Patient (Initial Visit)    Hematology     INTERVAL HISTORY: Patient is a 39 year old female who was found to have a mildly elevated platelet count on routine blood work. Repeat testing confirmed the results. She currently is anxious, but otherwise feels well. She has no neurologic complaints. She denies any fevers, night sweats, or weight loss. She has no chest pain or shortness of breath. She denies any nausea, vomiting, constipation, or diarrhea. She has no urinary complaints. Patient otherwise feels well and offers no further specific complaints.  REVIEW OF SYSTEMS:   Review of Systems  Constitutional: Negative.  Negative for fever, weight loss and malaise/fatigue.  Respiratory: Negative.   Cardiovascular: Negative.   Gastrointestinal: Negative.   Neurological: Negative.  Negative for weakness.  Endo/Heme/Allergies: Does not bruise/bleed easily.  Psychiatric/Behavioral: The patient is nervous/anxious.     As per HPI. Otherwise, a complete review of systems is negatve.  PAST MEDICAL HISTORY: Past Medical History  Diagnosis Date  . Hypertension   . Pre-diabetes     On Metformin.  . Bulging lumbar disc     Seen Pain Clinic at [facility]  . Partial tear of rotator cuff     Injections did not help  . Anxiety   . Depression     Seeing Mental Health Professional  . Migraines   . Blood transfusion without reported diagnosis   . Diabetes mellitus without complication ([place])     PAST SURGICAL HISTORY: Past Surgical History  Procedure Laterality Date  . Other surgical history Left ankle surgery  . Salpingectomy Bilateral   . Ankle surgery Left     FAMILY HISTORY Family History  Problem  Relation Age of Onset  . Depression Mother   . Hypertension Mother   . Diabetes Mother   . Hypertension Father   . Hypertension Sister   . Depression Brother        ADVANCED DIRECTIVES:    HEALTH MAINTENANCE: Social History  Substance Use Topics  . Smoking status: Current Every Day Smoker -- 1.00 packs/day for 15 years    Types: Cigarettes  . Smokeless tobacco: Not on file  . Alcohol Use: Yes     Comment: Occasionally during Holidays     Colonoscopy:  PAP:  Bone density:  Lipid panel:  No Known Allergies  Current Outpatient Prescriptions  Medication Sig Dispense Refill  . acetaminophen-codeine (TYLENOL #3) 300-30 MG tablet Take 1 tablet by mouth every 8 (eight) hours as needed for moderate pain. 30 tablet 0  . guaiFENesin-codeine (CHERATUSSIN AC) 100-10 MG/5ML syrup Take 10 mLs by mouth 4 (four) times daily as needed for cough. 200 mL 0  . ibuprofen (ADVIL,MOTRIN) 200 MG tablet Take 200 mg by mouth every 6 (six) hours as needed.    . metFORMIN (GLUCOPHAGE) 500 MG tablet Take 500 mg by mouth 2 (two) times daily.    . propranolol (INDERAL) 40 MG tablet Take 40 mg by mouth 2 (two) times daily.     No current facility-administered medications for this visit.    OBJECTIVE: Filed Vitals:   07/23/15 0920  BP: 144/93  Pulse: 69  Temp: 97.6 F (36.4 C)     Body mass index is 37.33 kg/(m^2).  ECOG FS:0 - Asymptomatic  General: Well-developed, well-nourished, no acute distress. Eyes: Pink conjunctiva, anicteric sclera. HEENT: Normocephalic, moist mucous membranes, clear oropharnyx. Lungs: Clear to auscultation bilaterally. Heart: Regular rate and rhythm. No rubs, murmurs, or gallops. Abdomen: Soft, nontender, nondistended. No organomegaly noted, normoactive bowel sounds. Musculoskeletal: No edema, cyanosis, or clubbing. Neuro: Alert, answering all questions appropriately. Cranial nerves grossly intact. Skin: No rashes or petechiae noted. Psych: Normal  affect. Lymphatics: No cervical, calvicular, axillary or inguinal LAD.   LAB RESULTS:  Lab Results  Component Value Date   NA 136 07/14/2015   K 4.7 07/14/2015   CL 99 07/14/2015   CO2 22 07/14/2015   GLUCOSE 105* 07/14/2015   BUN 8 07/14/2015   CREATININE 0.82 07/14/2015   CALCIUM 9.4 07/14/2015   PROT 6.9 07/14/2015   ALBUMIN 4.0 07/14/2015   AST 17 07/14/2015   ALT 17 07/14/2015   ALKPHOS 120* 07/14/2015   BILITOT 0.4 07/14/2015   GFRNONAA 90 07/14/2015   GFRAA 104 07/14/2015    Lab Results  Component Value Date   WBC 10.1 07/23/2015   NEUTROABS 7.0* 07/23/2015   HGB 13.7 07/23/2015   HCT 41.3 07/23/2015   MCV 86.9 07/23/2015   PLT 476* 07/23/2015     STUDIES: Dg Chest 2 View  07/14/2015  CLINICAL DATA:  Intermittent dry cough for 6 weeks. EXAM: CHEST - 2 VIEW COMPARISON:  Two-view chest x-ray 11/05/2014. FINDINGS: The heart size is exaggerated by low lung volumes. Is no edema or effusion. No focal airspace disease is evident. The visualized soft tissues and bony thorax are unremarkable. IMPRESSION: 1. Low lung volumes. 2. No acute cardiopulmonary disease. Electronically Signed   By: San Morelle M.D.   On: 07/14/2015 16:12    ASSESSMENT: Mild thrombocytosis.  PLAN:    1. Thrombocytosis: Patient's platelet count is only mildly elevated. The remainder of her blood work is either negative or within normal limits. JAK-2 mutation is pending to assess for underlying essential thrombocytosis, but this is only positive in approximately 50% of the patients. No intervention is needed at this time. Patient does not require bone marrow biopsy. Return to clinic in 3 months with repeat laboratory work and further evaluation.  Patient expressed understanding and was in agreement with this plan. She also understands that She can call clinic at any time with any questions, concerns, or complaints.   Lloyd Huger, MD   08/01/2015 2:48 PM

## 2015-08-04 LAB — JAK2 EXONS 12-15

## 2015-08-06 LAB — PAP LB, RFX HPV ASCU: PAP SMEAR COMMENT: 0

## 2015-08-12 ENCOUNTER — Ambulatory Visit: Payer: Medicaid Other | Admitting: Family Medicine

## 2015-08-18 ENCOUNTER — Ambulatory Visit (INDEPENDENT_AMBULATORY_CARE_PROVIDER_SITE_OTHER): Payer: Medicaid Other | Admitting: Family Medicine

## 2015-08-18 ENCOUNTER — Other Ambulatory Visit: Payer: Self-pay | Admitting: Family Medicine

## 2015-08-18 ENCOUNTER — Encounter: Payer: Self-pay | Admitting: Family Medicine

## 2015-08-18 VITALS — BP 138/78 | HR 73 | Temp 98.9°F | Resp 17 | Ht 64.0 in | Wt 218.3 lb

## 2015-08-18 DIAGNOSIS — G8929 Other chronic pain: Secondary | ICD-10-CM

## 2015-08-18 DIAGNOSIS — R87615 Unsatisfactory cytologic smear of cervix: Secondary | ICD-10-CM | POA: Insufficient documentation

## 2015-08-18 DIAGNOSIS — E559 Vitamin D deficiency, unspecified: Secondary | ICD-10-CM | POA: Diagnosis not present

## 2015-08-18 DIAGNOSIS — M542 Cervicalgia: Secondary | ICD-10-CM | POA: Diagnosis not present

## 2015-08-18 MED ORDER — IBUPROFEN 600 MG PO TABS: 600.0000 mg | ORAL_TABLET | Freq: Three times a day (TID) | ORAL | Status: DC | PRN

## 2015-08-18 MED ORDER — VITAMIN D3 1.25 MG (50000 UT) PO CAPS: 1.0000 | ORAL_CAPSULE | ORAL | Status: DC

## 2015-08-18 NOTE — Progress Notes (Signed)
Name: Brenda Simmons   MRN: [id number]    DOB: 03-05-1976   Date:08/18/2015       Progress Note  Subjective  Chief Complaint  Chief Complaint  Patient presents with  . Follow-up    Repeat Pap    HPI  Pt. Is here to repeat pap smear. Initial Pap smear performed on 11-16, which was returned due to insufficient cellularity.   She has Vitamin D deficiency as evidenced by a recent Vitamin D level of 16.0. She reports chronic pain in her neck, shoulder and hips.  Chronic pain Pt. Reports chronic pain in her neck, shoulders, lower back, left ankle, both hips, and knees. In the past, she reportedly had MRI of neck and X rays of her back burt results are not available. She has been followed by pain Management in [facility] and has been tried on [place] and Lyrica which did not work. She was also advised 'epidural' in her lower back and received steroid shots before, which also did not help. She could see them anymore before there was a 'problem with the medicaid.' She was started on Tylenol # 3, which did not work. Recently, she has been started on a new medication by her psychiatrist, but patient could not remember its name.  Past Medical History  Diagnosis Date  . Hypertension   . Pre-diabetes     On Metformin.  . Bulging lumbar disc     Seen Pain Clinic at [facility]  . Partial tear of rotator cuff     Injections did not help  . Anxiety   . Depression     Seeing Mental Health Professional  . Migraines   . Blood transfusion without reported diagnosis   . Diabetes mellitus without complication [facility])     Past Surgical History  Procedure Laterality Date  . Other surgical history Left ankle surgery  . Salpingectomy Bilateral   . Ankle surgery Left     Family History  Problem Relation Age of Onset  . Depression Mother   . Hypertension Mother   . Diabetes Mother   . Hypertension Father   . Hypertension Sister   . Depression Brother     Social History   Social History  .  Marital Status: Single    Spouse Name: N/A  . Number of Children: N/A  . Years of Education: N/A   Occupational History  . Not on file.   Social History Main Topics  . Smoking status: Current Every Day Smoker -- 1.00 packs/day for 15 years    Types: Cigarettes  . Smokeless tobacco: Not on file  . Alcohol Use: Yes     Comment: Occasionally during Holidays  . Drug Use: No  . Sexual Activity: Yes    Birth Control/ Protection: None   Other Topics Concern  . Not on file   Social History Narrative     Current outpatient prescriptions:  .  acetaminophen-codeine (TYLENOL #3) 300-30 MG tablet, Take 1 tablet by mouth every 8 (eight) hours as needed for moderate pain., Disp: 30 tablet, Rfl: 0 .  guaiFENesin-codeine (CHERATUSSIN AC) 100-10 MG/5ML syrup, Take 10 mLs by mouth 4 (four) times daily as needed for cough., Disp: 200 mL, Rfl: 0 .  ibuprofen (ADVIL,MOTRIN) 200 MG tablet, Take 200 mg by mouth every 6 (six) hours as needed., Disp: , Rfl:  .  metFORMIN (GLUCOPHAGE) 500 MG tablet, Take 500 mg by mouth 2 (two) times daily., Disp: , Rfl:  .  propranolol (INDERAL) 40  MG tablet, Take 40 mg by mouth 2 (two) times daily., Disp: , Rfl:   No Known Allergies   Review of Systems  Musculoskeletal: Positive for myalgias, back pain, joint pain and neck pain.  Psychiatric/Behavioral: Positive for depression. The patient is nervous/anxious.     Objective  Filed Vitals:   08/18/15 0944  BP: 138/78  Pulse: 73  Temp: 98.9 F (37.2 C)  TempSrc: Oral  Resp: 17  Height: 5\' 4"  (1.626 m)  Weight: 218 lb 4.8 oz (99.02 kg)  SpO2: 97%    Physical Exam  Constitutional: She is oriented to person, place, and time and well-developed, well-nourished, and in no distress.  Genitourinary: Cervix normal. Cervix exhibits no lesion. Vagina exhibits normal mucosa and no exudate.  Neurological: She is alert and oriented to person, place, and time.  Nursing note and vitals reviewed.    Assessment &  Plan  1. Vitamin D deficiency Start on vitamin D 50,000 units once weekly for 12 weeks. Repeat levels in 12 weeks per - Cholecalciferol (VITAMIN D3) 50000 UNITS CAPS; Take 1 capsule by mouth once a week.  Dispense: 12 capsule; Refill: 0  2. Pap smear of cervix unsatisfactory Repeat Pap smear performed today. - Cytology - PAP  3. Chronic neck pain DC Advil and will start on ibuprofen 600 mg 3 times a day as needed for 7 days only. Advised to take with meals and drink plenty of water. Follow-up with orthopedist. - ibuprofen (ADVIL,MOTRIN) 600 MG tablet; Take 1 tablet (600 mg total) by mouth every 8 (eight) hours as needed.  Dispense: 21 tablet; Refill: 0 .   [facility] 08/18/2015 10:00 AM

## 2015-08-25 ENCOUNTER — Ambulatory Visit: Payer: Medicaid Other | Admitting: Family Medicine

## 2015-08-25 LAB — PAP LB, RFX HPV ASCU: PAP SMEAR COMMENT: 0

## 2015-09-08 ENCOUNTER — Encounter: Payer: Self-pay | Admitting: Cardiovascular Disease

## 2015-09-08 ENCOUNTER — Ambulatory Visit (INDEPENDENT_AMBULATORY_CARE_PROVIDER_SITE_OTHER): Payer: Medicaid Other | Admitting: Cardiovascular Disease

## 2015-09-08 VITALS — BP 130/80 | HR 80 | Ht 64.0 in | Wt 223.0 lb

## 2015-09-08 DIAGNOSIS — R000 Tachycardia, unspecified: Secondary | ICD-10-CM

## 2015-09-08 DIAGNOSIS — R079 Chest pain, unspecified: Secondary | ICD-10-CM

## 2015-09-08 DIAGNOSIS — R9431 Abnormal electrocardiogram [ECG] [EKG]: Secondary | ICD-10-CM | POA: Diagnosis not present

## 2015-09-08 DIAGNOSIS — R0602 Shortness of breath: Secondary | ICD-10-CM

## 2015-09-08 NOTE — Patient Instructions (Signed)
Medication Instructions: No changes.  Labwork: None.   Procedures/Testing: None.   Follow-Up: As needed .   Any Additional Special Instructions Will Be Listed Below (If Applicable).   

## 2015-09-08 NOTE — Assessment & Plan Note (Signed)
The EKG showed isolated T-wave inversion in lead 3 which is a normal variant and does not represent any significant abnormality. She has no symptoms suggestive of angina, heart failure or arrhythmia and thus no further cardiac workup is recommended at the present time. She does have multiple risk factors for atherosclerosis and I discussed with her the importance of controlling her risk factors and especially smoking cessation. She is to follow-up with me as needed.

## 2015-09-08 NOTE — Progress Notes (Signed)
Primary care physician: Dr. Keith Rake  HPI  This is a 39 year old African-American female who was referred for an abnormal EKG. She has no previous cardiac history. She has known history of mild hypertension, tobacco use and type 2 diabetes. She has no family history of coronary artery disease. She had a routine EKG done recently which showed nonspecific inferior T wave changes and thus she was referred for evaluation. She has extremely rare episodes of chest pain and some shortness of breath not with physical activities. There has been no change in her physical capacity. She has no orthopnea, PND or leg edema. No dizziness, syncope or presyncope.  No Known Allergies   Current Outpatient Prescriptions on File Prior to Visit  Medication Sig Dispense Refill  . acetaminophen-codeine (TYLENOL #3) 300-30 MG tablet Take 1 tablet by mouth every 8 (eight) hours as needed for moderate pain. 30 tablet 0  . Cholecalciferol (VITAMIN D3) 50000 UNITS CAPS Take 1 capsule by mouth once a week. 12 capsule 0  . guaiFENesin-codeine (CHERATUSSIN AC) 100-10 MG/5ML syrup Take 10 mLs by mouth 4 (four) times daily as needed for cough. 200 mL 0  . ibuprofen (ADVIL,MOTRIN) 600 MG tablet Take 1 tablet (600 mg total) by mouth every 8 (eight) hours as needed. 21 tablet 0  . metFORMIN (GLUCOPHAGE) 500 MG tablet Take 500 mg by mouth 2 (two) times daily.    . propranolol (INDERAL) 40 MG tablet Take 40 mg by mouth 2 (two) times daily.     No current facility-administered medications on file prior to visit.     Past Medical History  Diagnosis Date  . Hypertension   . Pre-diabetes     On Metformin.  . Bulging lumbar disc     Seen Pain Clinic at [facility]  . Partial tear of rotator cuff     Injections did not help  . Anxiety   . Depression     Seeing Mental Health Professional  . Migraines   . Blood transfusion without reported diagnosis   . Diabetes mellitus without complication [facility])      Past Surgical History    Procedure Laterality Date  . Other surgical history Left ankle surgery  . Salpingectomy Bilateral   . Ankle surgery Left      Family History  Problem Relation Age of Onset  . Depression Mother   . Hypertension Mother   . Diabetes Mother   . Hypertension Father   . Hypertension Sister   . Depression Brother      Social History   Social History  . Marital Status: Single    Spouse Name: N/A  . Number of Children: N/A  . Years of Education: N/A   Occupational History  . Not on file.   Social History Main Topics  . Smoking status: Current Every Day Smoker -- 1.00 packs/day for 15 years    Types: Cigarettes  . Smokeless tobacco: Not on file  . Alcohol Use: Yes     Comment: Occasionally during Holidays  . Drug Use: No  . Sexual Activity: Yes    Birth Control/ Protection: None   Other Topics Concern  . Not on file   Social History Narrative     ROS A 10 point review of system was performed. It is negative other than that mentioned in the history of present illness.   PHYSICAL EXAM   BP 130/80 mmHg  Pulse 80  Ht 5\' 4"  (1.626 m)  Wt 223 lb (101.152 kg)  BMI  38.26 kg/m2  LMP 08/12/2015  Constitutional: She is oriented to person, place, and time. She appears well-developed and well-nourished. No distress.  HENT: No nasal discharge.  Head: Normocephalic and atraumatic.  Eyes: Pupils are equal and round. No discharge.  Neck: Normal range of motion. Neck supple. No JVD present. No thyromegaly present.  Cardiovascular: Normal rate, regular rhythm, normal heart sounds. Exam reveals no gallop and no friction rub. No murmur heard.  Pulmonary/Chest: Effort normal and breath sounds normal. No stridor. No respiratory distress. She has no wheezes. She has no rales. She exhibits no tenderness.  Abdominal: Soft. Bowel sounds are normal. She exhibits no distension. There is no tenderness. There is no rebound and no guarding.  Musculoskeletal: Normal range of motion. She  exhibits no edema and no tenderness.  Neurological: She is alert and oriented to person, place, and time. Coordination normal.  Skin: Skin is warm and dry. No rash noted. She is not diaphoretic. No erythema. No pallor.  Psychiatric: She has a normal mood and affect. Her behavior is normal. Judgment and thought content normal.    EKG: Normal sinus rhythm with isolated T-wave inversion in lead 3 which is a normal variant.   ASSESSMENT AND PLAN

## 2015-09-11 ENCOUNTER — Other Ambulatory Visit: Payer: Self-pay

## 2015-09-11 ENCOUNTER — Encounter: Payer: Self-pay | Admitting: Family Medicine

## 2015-09-11 ENCOUNTER — Ambulatory Visit (INDEPENDENT_AMBULATORY_CARE_PROVIDER_SITE_OTHER): Payer: Medicaid Other | Admitting: Family Medicine

## 2015-09-11 ENCOUNTER — Emergency Department
Admission: EM | Admit: 2015-09-11 | Discharge: 2015-09-11 | Disposition: A | Discharge status: Home / Self Care | Payer: Medicaid Other | Attending: Emergency Medicine | Admitting: Emergency Medicine

## 2015-09-11 ENCOUNTER — Encounter: Payer: Self-pay | Admitting: Emergency Medicine

## 2015-09-11 VITALS — BP 128/72 | HR 78 | Temp 99.1°F | Resp 15 | Ht 64.0 in | Wt 221.5 lb

## 2015-09-11 DIAGNOSIS — I10 Essential (primary) hypertension: Secondary | ICD-10-CM | POA: Diagnosis not present

## 2015-09-11 DIAGNOSIS — E119 Type 2 diabetes mellitus without complications: Secondary | ICD-10-CM | POA: Insufficient documentation

## 2015-09-11 DIAGNOSIS — J069 Acute upper respiratory infection, unspecified: Secondary | ICD-10-CM | POA: Insufficient documentation

## 2015-09-11 DIAGNOSIS — G8929 Other chronic pain: Secondary | ICD-10-CM | POA: Insufficient documentation

## 2015-09-11 DIAGNOSIS — N63 Unspecified lump in breast: Secondary | ICD-10-CM | POA: Diagnosis not present

## 2015-09-11 DIAGNOSIS — M25511 Pain in right shoulder: Secondary | ICD-10-CM | POA: Diagnosis not present

## 2015-09-11 DIAGNOSIS — Z7984 Long term (current) use of oral hypoglycemic drugs: Secondary | ICD-10-CM | POA: Diagnosis not present

## 2015-09-11 DIAGNOSIS — Z79899 Other long term (current) drug therapy: Secondary | ICD-10-CM | POA: Insufficient documentation

## 2015-09-11 DIAGNOSIS — F17210 Nicotine dependence, cigarettes, uncomplicated: Secondary | ICD-10-CM | POA: Diagnosis not present

## 2015-09-11 DIAGNOSIS — N6310 Unspecified lump in the right breast, unspecified quadrant: Secondary | ICD-10-CM

## 2015-09-11 MED ORDER — ETODOLAC 400 MG PO TABS: 400.0000 mg | ORAL_TABLET | Freq: Two times a day (BID) | ORAL | Status: DC

## 2015-09-11 MED ORDER — BENZONATATE 200 MG PO CAPS: 200.0000 mg | ORAL_CAPSULE | Freq: Three times a day (TID) | ORAL | Status: DC | PRN

## 2015-09-11 NOTE — ED Notes (Signed)
Pt presents with right shoulder pain for one year. Pt was dx with rotator cuff tear but ortho will not see her without a referral.

## 2015-09-11 NOTE — ED Notes (Signed)
assessmenmt per PA

## 2015-09-11 NOTE — Progress Notes (Signed)
Name: Brenda Simmons   MRN: [id number]    DOB: [birth year]   Date:09/11/2015       Progress Note  Subjective  Chief Complaint  Chief Complaint  Patient presents with  . Follow-up    1 mo    Sore Throat  This is a new problem. Episode onset: 3 days ago. There has been no fever. Associated symptoms include congestion and coughing. Pertinent negatives include no ear pain, shortness of breath or trouble swallowing. She has had no exposure to strep.   Past Medical History  Diagnosis Date  . Hypertension   . Pre-diabetes     On Metformin.  . Bulging lumbar disc     Seen Pain Clinic at [facility]  . Partial tear of rotator cuff     Injections did not help  . Anxiety   . Depression     Seeing Mental Health Professional  . Migraines   . Blood transfusion without reported diagnosis   . Diabetes mellitus without complication [facility])     Past Surgical History  Procedure Laterality Date  . Other surgical history Left ankle surgery  . Salpingectomy Bilateral   . Ankle surgery Left     Family History  Problem Relation Age of Onset  . Depression Mother   . Hypertension Mother   . Diabetes Mother   . Hypertension Father   . Hypertension Sister   . Depression Brother     Social History   Social History  . Marital Status: Single    Spouse Name: N/A  . Number of Children: N/A  . Years of Education: N/A   Occupational History  . Not on file.   Social History Main Topics  . Smoking status: Current Every Day Smoker -- 1.00 packs/day for 15 years    Types: Cigarettes  . Smokeless tobacco: Not on file  . Alcohol Use: Yes     Comment: Occasionally during Holidays  . Drug Use: No  . Sexual Activity: Yes    Birth Control/ Protection: None   Other Topics Concern  . Not on file   Social History Narrative     Current outpatient prescriptions:  .  Cholecalciferol (VITAMIN D3) 50000 UNITS CAPS, Take 1 capsule by mouth once a week., Disp: 12 capsule, Rfl: 0 .  ibuprofen  (ADVIL,MOTRIN) 600 MG tablet, Take 1 tablet (600 mg total) by mouth every 8 (eight) hours as needed., Disp: 21 tablet, Rfl: 0 .  metFORMIN (GLUCOPHAGE) 500 MG tablet, Take 500 mg by mouth 2 (two) times daily., Disp: , Rfl:  .  mirtazapine (REMERON) 15 MG tablet, Take 15 mg by mouth at bedtime., Disp: , Rfl:  .  propranolol (INDERAL) 40 MG tablet, Take 40 mg by mouth 2 (two) times daily., Disp: , Rfl:  .  acetaminophen-codeine (TYLENOL #3) 300-30 MG tablet, Take 1 tablet by mouth every 8 (eight) hours as needed for moderate pain. (Patient not taking: Reported on 09/11/2015), Disp: 30 tablet, Rfl: 0 .  guaiFENesin-codeine (CHERATUSSIN AC) 100-10 MG/5ML syrup, Take 10 mLs by mouth 4 (four) times daily as needed for cough. (Patient not taking: Reported on 09/11/2015), Disp: 200 mL, Rfl: 0  No Known Allergies   Review of Systems  HENT: Positive for congestion. Negative for ear pain and trouble swallowing.   Respiratory: Positive for cough. Negative for shortness of breath.       Objective  Filed Vitals:   09/11/15 0917  BP: 128/72  Pulse: 78  Temp: 99.1 F (37.3 C)  TempSrc: Oral  Resp: 15  Height: 5\' 4"  (1.626 m)  Weight: 221 lb 8 oz (100.472 kg)  SpO2: 96%    Physical Exam  Constitutional: She is well-developed, well-nourished, and in no distress.  HENT:  Head: Normocephalic and atraumatic.  Right Ear: Tympanic membrane and ear canal normal.  Left Ear: Tympanic membrane and ear canal normal.  Nose: Right sinus exhibits no maxillary sinus tenderness and no frontal sinus tenderness. Left sinus exhibits no maxillary sinus tenderness and no frontal sinus tenderness.  Mouth/Throat: Mucous membranes are normal. No posterior oropharyngeal erythema.  Neck:  mild tenderness to palpation  Cardiovascular: Normal rate and regular rhythm.   Pulmonary/Chest: Effort normal and breath sounds normal.  Nursing note and vitals reviewed.    Assessment & Plan  1. Viral URI  - benzonatate  (TESSALON) 200 MG capsule; Take 1 capsule (200 mg total) by mouth 3 (three) times daily as needed for cough.  Dispense: 20 capsule; Refill: 0  2. Breast mass, right Patient has not received any appointment for mammogram from the referral originally ordered on 07/30/15. We will reauthorize screening mammogram and diagnostic ultrasound of right breast. - MM Digital Screening; Future - MM Digital Diagnostic Unilat R; Future    [facility] 09/11/2015 9:26 AM

## 2015-09-11 NOTE — Discharge Instructions (Signed)
Call Dr. Roland Rack for an appointment. Begin taking etodolac twice a day with food.

## 2015-09-11 NOTE — ED Provider Notes (Signed)
[facility] Emergency Department Provider Note  ____________________________________________  Time seen: Approximately 10:09 AM  I have reviewed the triage vital signs and the nursing notes.   HISTORY  Chief Complaint Shoulder Pain  HPI Brenda Simmons is a 39 y.o. female is here complaining of right shoulder pain for 1 year. Patient states that she was seen at [facility] by an orthopedist was told that she had a rotator cuff tear after having an MRI. She states that she has not seen anybody since then and has continued to have problems with her arm.She states she has  been ibuprofen over-the-counter for her arm. She states that she cannot see a orthopedist in this area without referral.   Past Medical History  Diagnosis Date  . Hypertension   . Pre-diabetes     On Metformin.  . Bulging lumbar disc     Seen Pain Clinic at [facility]  . Partial tear of rotator cuff     Injections did not help  . Anxiety   . Depression     Seeing Mental Health Professional  . Migraines   . Blood transfusion without reported diagnosis   . Diabetes mellitus without complication [facility])     Patient Active Problem List   Diagnosis Date Noted  . Viral URI 09/11/2015  . Breast mass, right 09/11/2015  . Nonspecific abnormal electrocardiogram (ECG) (EKG) 09/08/2015  . Vitamin D deficiency 08/18/2015  . Pap smear of cervix unsatisfactory 08/18/2015  . Annual physical exam 07/30/2015  . Chronic neck pain 07/30/2015  . Chronic right shoulder pain 07/30/2015  . Elevated platelet count ([place]) 07/16/2015  . Persistent dry cough 07/14/2015  . Pre-diabetes 07/14/2015  . Disorder of rotator cuff 04/25/2014    Past Surgical History  Procedure Laterality Date  . Other surgical history Left ankle surgery  . Salpingectomy Bilateral   . Ankle surgery Left     Current Outpatient Rx  Name  Route  Sig  Dispense  Refill  . benzonatate (TESSALON) 200 MG capsule   Oral   Take 1 capsule (200  mg total) by mouth 3 (three) times daily as needed for cough.   20 capsule   0   . Cholecalciferol (VITAMIN D3) 50000 UNITS CAPS   Oral   Take 1 capsule by mouth once a week.   12 capsule   0   . etodolac (LODINE) 400 MG tablet   Oral   Take 1 tablet (400 mg total) by mouth 2 (two) times daily.   20 tablet   0   . metFORMIN (GLUCOPHAGE) 500 MG tablet   Oral   Take 500 mg by mouth 2 (two) times daily.         . mirtazapine (REMERON) 15 MG tablet   Oral   Take 15 mg by mouth at bedtime.         . propranolol (INDERAL) 40 MG tablet   Oral   Take 40 mg by mouth 2 (two) times daily.           Allergies Review of patient's allergies indicates no known allergies.  Family History  Problem Relation Age of Onset  . Depression Mother   . Hypertension Mother   . Diabetes Mother   . Hypertension Father   . Hypertension Sister   . Depression Brother     Social History Social History  Substance Use Topics  . Smoking status: Current Every Day Smoker -- 1.00 packs/day for 15 years    Types:  Cigarettes  . Smokeless tobacco: None  . Alcohol Use: Yes     Comment: Occasionally during Holidays    Review of Systems Constitutional: No fever/chills Eyes: No visual changes. ENT: No sore throat. Cardiovascular: Denies chest pain. Respiratory: Denies shortness of breath. Gastrointestinal:   No nausea, no vomiting.  Genitourinary: Negative for dysuria. Musculoskeletal: Negative for back pain. Right shoulder pain. Skin: Negative for rash. Neurological: Negative for headaches, focal weakness or numbness.  10-point ROS otherwise negative.  ____________________________________________   PHYSICAL EXAM:  VITAL SIGNS: ED Triage Vitals  Enc Vitals Group     BP 09/11/15 0958 135/82 mmHg     Pulse Rate 09/11/15 0958 70     Resp 09/11/15 0958 18     Temp 09/11/15 0958 98.4 F (36.9 C)     Temp Source 09/11/15 0958 Oral     SpO2 09/11/15 0958 94 %     Weight 09/11/15  0958 220 lb (99.791 kg)     Height 09/11/15 0958 5\' 4"  (1.626 m)     Head Cir --      Peak Flow --      Pain Score 09/11/15 1003 9     Pain Loc --      Pain Edu? --      Excl. in [place]? --     Constitutional: Alert and oriented. Well appearing and in no acute distress. Eyes: Conjunctivae are normal. PERRL. EOMI. Head: Atraumatic. Nose: No congestion/rhinnorhea. Neck: No stridor.  Supple Cardiovascular: Normal rate, regular rhythm. Grossly normal heart sounds.  Good peripheral circulation. Respiratory: Normal respiratory effort.  No retractions. Lungs CTAB. Gastrointestinal: Soft and nontender. No distention. No abdominal bruits. No CVA tenderness. Musculoskeletal: Right shoulder noted gross deformity was noted. Range of motion is restricted secondary to patient's pain. Patient's pain increases with abduction. Muscle strength is equal bilaterally. Grip strength is equal bilaterally. Neurologic:  Normal speech and language. No gross focal neurologic deficits are appreciated. No gait instability. Skin:  Skin is warm, dry and intact. No rash noted. No erythema or ecchymosis is noted on the upper arm. Psychiatric: Mood and affect are normal. Speech and behavior are normal.  ____________________________________________   LABS (all labs ordered are listed, but only abnormal results are displayed)  Labs Reviewed - No data to display  PROCEDURES  Procedure(s) performed: None  Critical Care performed: No  ____________________________________________   INITIAL IMPRESSION / ASSESSMENT AND PLAN / ED COURSE  Pertinent labs & imaging results that were available during my care of the patient were reviewed by me and considered in my medical decision making (see chart for details).  Patient was started on etodolac one twice a day. She is referred to Dr. Roland Rack. She is aware that she is to call the office and make an appointment. ____________________________________________   FINAL CLINICAL  IMPRESSION(S) / ED DIAGNOSES  Final diagnoses:  Chronic right shoulder pain      Johnn Hai, PA-C 09/11/15 [place], MD 09/12/15 [phone]

## 2015-10-08 ENCOUNTER — Telehealth: Payer: Self-pay | Admitting: Family Medicine

## 2015-10-08 NOTE — Telephone Encounter (Signed)
Patient is checking status on her referral appointment for [place] Mammogram. Stated that the doctor ordered this the beginning of December. Please return patient call [phone] or [phone]

## 2015-10-23 ENCOUNTER — Inpatient Hospital Stay: Payer: Medicaid Other | Attending: Oncology

## 2015-10-23 ENCOUNTER — Inpatient Hospital Stay (HOSPITAL_BASED_OUTPATIENT_CLINIC_OR_DEPARTMENT_OTHER): Payer: Medicaid Other | Admitting: Oncology

## 2015-10-23 ENCOUNTER — Other Ambulatory Visit: Payer: Self-pay | Admitting: *Deleted

## 2015-10-23 VITALS — BP 131/87 | HR 64 | Temp 97.7°F | Resp 16 | Wt 221.3 lb

## 2015-10-23 DIAGNOSIS — F17210 Nicotine dependence, cigarettes, uncomplicated: Secondary | ICD-10-CM | POA: Diagnosis not present

## 2015-10-23 DIAGNOSIS — Z79899 Other long term (current) drug therapy: Secondary | ICD-10-CM | POA: Insufficient documentation

## 2015-10-23 DIAGNOSIS — R7989 Other specified abnormal findings of blood chemistry: Secondary | ICD-10-CM

## 2015-10-23 DIAGNOSIS — E119 Type 2 diabetes mellitus without complications: Secondary | ICD-10-CM | POA: Diagnosis not present

## 2015-10-23 DIAGNOSIS — Z7984 Long term (current) use of oral hypoglycemic drugs: Secondary | ICD-10-CM | POA: Diagnosis not present

## 2015-10-23 DIAGNOSIS — D473 Essential (hemorrhagic) thrombocythemia: Secondary | ICD-10-CM

## 2015-10-23 DIAGNOSIS — D72829 Elevated white blood cell count, unspecified: Secondary | ICD-10-CM

## 2015-10-23 DIAGNOSIS — F329 Major depressive disorder, single episode, unspecified: Secondary | ICD-10-CM | POA: Insufficient documentation

## 2015-10-23 DIAGNOSIS — I10 Essential (primary) hypertension: Secondary | ICD-10-CM | POA: Insufficient documentation

## 2015-10-23 LAB — CBC
HCT: 40.9 % (ref 35.0–47.0)
Hemoglobin: 13.8 g/dL (ref 12.0–16.0)
MCH: 30 pg (ref 26.0–34.0)
MCHC: 33.6 g/dL (ref 32.0–36.0)
MCV: 89.1 fL (ref 80.0–100.0)
Platelets: 436 10*3/uL (ref 150–440)
RBC: 4.59 MIL/uL (ref 3.80–5.20)
RDW: 12.9 % (ref 11.5–14.5)
WBC: 11.9 10*3/uL — AB (ref 3.6–11.0)

## 2015-10-24 ENCOUNTER — Ambulatory Visit: Payer: Medicaid Other

## 2015-10-24 ENCOUNTER — Other Ambulatory Visit: Payer: Medicaid Other

## 2015-10-26 NOTE — Progress Notes (Signed)
[facility]  [phone] [phone]  ID: Brenda Simmons OB: Sep 12, 1976  MR#: [id number]  BWL#:[id number]  Patient Care Team: Roselee Nova, MD as PCP - General (Family Medicine)  CHIEF COMPLAINT:  Chief Complaint  Patient presents with  . elevated platelet count    INTERVAL HISTORY: Patient returns to clinic today for repeat laboratory work and further evaluation. She currently feels well and is asymptomatic. She has no neurologic complaints. She denies any fevers, night sweats, or weight loss. She has no chest pain or shortness of breath. She denies any nausea, vomiting, constipation, or diarrhea. She has no urinary complaints. Patient offers no specific complaints today.  REVIEW OF SYSTEMS:   Review of Systems  Constitutional: Negative.  Negative for fever, weight loss and malaise/fatigue.  Respiratory: Negative.   Cardiovascular: Negative.   Gastrointestinal: Negative.   Musculoskeletal: Negative.   Neurological: Negative.  Negative for weakness.  Endo/Heme/Allergies: Does not bruise/bleed easily.  Psychiatric/Behavioral: The patient is not nervous/anxious.     As per HPI. Otherwise, a complete review of systems is negatve.  PAST MEDICAL HISTORY: Past Medical History  Diagnosis Date  . Hypertension   . Pre-diabetes     On Metformin.  . Bulging lumbar disc     Seen Pain Clinic at [facility]  . Partial tear of rotator cuff     Injections did not help  . Anxiety   . Depression     Seeing Mental Health Professional  . Migraines   . Blood transfusion without reported diagnosis   . Diabetes mellitus without complication ([place])     PAST SURGICAL HISTORY: Past Surgical History  Procedure Laterality Date  . Other surgical history Left ankle surgery  . Salpingectomy Bilateral   . Ankle surgery Left     FAMILY HISTORY Family History  Problem Relation Age of Onset  . Depression Mother   . Hypertension Mother   . Diabetes  Mother   . Hypertension Father   . Hypertension Sister   . Depression Brother        ADVANCED DIRECTIVES:    HEALTH MAINTENANCE: Social History  Substance Use Topics  . Smoking status: Current Every Day Smoker -- 1.00 packs/day for 15 years    Types: Cigarettes  . Smokeless tobacco: Not on file  . Alcohol Use: Yes     Comment: Occasionally during Holidays     Colonoscopy:  PAP:  Bone density:  Lipid panel:  No Known Allergies  Current Outpatient Prescriptions  Medication Sig Dispense Refill  . Cholecalciferol (VITAMIN D3) 50000 UNITS CAPS Take 1 capsule by mouth once a week. 12 capsule 0  . etodolac (LODINE) 400 MG tablet Take 1 tablet (400 mg total) by mouth 2 (two) times daily. 20 tablet 0  . metFORMIN (GLUCOPHAGE) 500 MG tablet Take 500 mg by mouth 2 (two) times daily.    . mirtazapine (REMERON) 15 MG tablet Take 15 mg by mouth at bedtime.    . propranolol (INDERAL) 40 MG tablet Take 40 mg by mouth 2 (two) times daily.    . benzonatate (TESSALON) 200 MG capsule Take 1 capsule (200 mg total) by mouth 3 (three) times daily as needed for cough. (Patient not taking: Reported on 10/23/2015) 20 capsule 0   No current facility-administered medications for this visit.    OBJECTIVE: Filed Vitals:   10/23/15 1115  BP: 131/87  Pulse: 64  Temp: 97.7 F (36.5 C)  Resp: 16     Body mass  index is 37.97 kg/(m^2).    ECOG FS:0 - Asymptomatic  General: Well-developed, well-nourished, no acute distress. Eyes: Pink conjunctiva, anicteric sclera. Lungs: Clear to auscultation bilaterally. Heart: Regular rate and rhythm. No rubs, murmurs, or gallops. Abdomen: Soft, nontender, nondistended. No organomegaly noted, normoactive bowel sounds. Musculoskeletal: No edema, cyanosis, or clubbing. Neuro: Alert, answering all questions appropriately. Cranial nerves grossly intact. Skin: No rashes or petechiae noted. Psych: Normal affect.  LAB RESULTS:  Lab Results  Component Value Date     NA 136 07/14/2015   K 4.7 07/14/2015   CL 99 07/14/2015   CO2 22 07/14/2015   GLUCOSE 105* 07/14/2015   BUN 8 07/14/2015   CREATININE 0.82 07/14/2015   CALCIUM 9.4 07/14/2015   PROT 6.9 07/14/2015   ALBUMIN 4.0 07/14/2015   AST 17 07/14/2015   ALT 17 07/14/2015   ALKPHOS 120* 07/14/2015   BILITOT 0.4 07/14/2015   GFRNONAA 90 07/14/2015   GFRAA 104 07/14/2015    Lab Results  Component Value Date   WBC 11.9* 10/23/2015   NEUTROABS 7.0* 07/23/2015   HGB 13.8 10/23/2015   HCT 40.9 10/23/2015   MCV 89.1 10/23/2015   PLT 436 10/23/2015     STUDIES: No results found.  ASSESSMENT: Thrombocytosis, resolved  PLAN:    1. Thrombocytosis: Patient's platelet count is now within normal limits. The remainder of her blood work is either negative or within normal limits, including JAK-2 mutation.  No intervention is needed at this time. Patient does not require bone marrow biopsy. No further follow-up is necessary. 2. Leukocytosis: Mild, likely reactive. Monitor.  Patient expressed understanding and was in agreement with this plan. She also understands that She can call clinic at any time with any questions, concerns, or complaints.   Lloyd Huger, MD   10/26/2015 6:54 AM

## 2015-10-28 ENCOUNTER — Ambulatory Visit: Payer: Medicaid Other | Admitting: Family Medicine

## 2015-11-06 ENCOUNTER — Other Ambulatory Visit: Payer: Self-pay | Admitting: Family Medicine

## 2015-11-06 ENCOUNTER — Ambulatory Visit
Admission: RE | Admit: 2015-11-06 | Discharge: 2015-11-06 | Disposition: A | Discharge status: Home / Self Care | Payer: Medicaid Other | Source: Ambulatory Visit | Attending: Family Medicine | Admitting: Family Medicine

## 2015-11-06 DIAGNOSIS — N6310 Unspecified lump in the right breast, unspecified quadrant: Secondary | ICD-10-CM

## 2015-11-06 DIAGNOSIS — N63 Unspecified lump in breast: Secondary | ICD-10-CM | POA: Diagnosis not present

## 2015-11-06 HISTORY — DX: Unspecified lump in unspecified breast: N63.0

## 2015-11-12 ENCOUNTER — Ambulatory Visit: Payer: Medicaid Other | Admitting: Family Medicine

## 2015-12-04 ENCOUNTER — Ambulatory Visit (INDEPENDENT_AMBULATORY_CARE_PROVIDER_SITE_OTHER): Payer: Medicaid Other | Admitting: Family Medicine

## 2015-12-04 ENCOUNTER — Encounter: Payer: Self-pay | Admitting: Family Medicine

## 2015-12-04 VITALS — BP 124/80 | HR 76 | Temp 98.6°F | Resp 18 | Ht 64.0 in | Wt 217.8 lb

## 2015-12-04 DIAGNOSIS — E559 Vitamin D deficiency, unspecified: Secondary | ICD-10-CM

## 2015-12-04 DIAGNOSIS — M549 Dorsalgia, unspecified: Secondary | ICD-10-CM

## 2015-12-04 DIAGNOSIS — R42 Dizziness and giddiness: Secondary | ICD-10-CM | POA: Diagnosis not present

## 2015-12-04 DIAGNOSIS — M542 Cervicalgia: Secondary | ICD-10-CM | POA: Diagnosis not present

## 2015-12-04 DIAGNOSIS — G43109 Migraine with aura, not intractable, without status migrainosus: Secondary | ICD-10-CM

## 2015-12-04 DIAGNOSIS — G43909 Migraine, unspecified, not intractable, without status migrainosus: Secondary | ICD-10-CM | POA: Insufficient documentation

## 2015-12-04 DIAGNOSIS — G8929 Other chronic pain: Secondary | ICD-10-CM

## 2015-12-04 DIAGNOSIS — G43009 Migraine without aura, not intractable, without status migrainosus: Secondary | ICD-10-CM | POA: Insufficient documentation

## 2015-12-04 MED ORDER — RIZATRIPTAN BENZOATE 10 MG PO TABS: 10.0000 mg | ORAL_TABLET | ORAL | Status: DC | PRN

## 2015-12-04 MED ORDER — MECLIZINE HCL 25 MG PO TABS: 25.0000 mg | ORAL_TABLET | Freq: Three times a day (TID) | ORAL | Status: DC | PRN

## 2015-12-04 NOTE — Progress Notes (Signed)
Name: Brenda Simmons   MRN: [id number]    DOB: 07/13/1976   Date:12/04/2015       Progress Note  Subjective  Chief Complaint  Chief Complaint  Patient presents with  . Migraine    for 2 weeks  . Dizziness    Dizziness This is a new problem. Episode onset: 2 weeks ago. Associated symptoms include headaches, nausea and vertigo. She has tried NSAIDs for the symptoms.  Headache  This is a new problem. The current episode started 1 to 4 weeks ago (2 weeks ago). The problem has been unchanged. The pain is located in the frontal region. The quality of the pain is described as dull. The pain is at a severity of 10/10. Associated symptoms include dizziness, eye pain, insomnia, nausea, phonophobia and photophobia. Pertinent negatives include no sinus pressure. The symptoms are aggravated by bright light. She has tried NSAIDs and beta blockers (BC powder) for the symptoms. The treatment provided no relief. Her past medical history is significant for migraine headaches. There is no history of sinus disease.     Past Medical History  Diagnosis Date  . Hypertension   . Pre-diabetes     On Metformin.  . Bulging lumbar disc     Seen Pain Clinic at [facility]  . Partial tear of rotator cuff     Injections did not help  . Anxiety   . Depression     Seeing Mental Health Professional  . Migraines   . Blood transfusion without reported diagnosis   . Diabetes mellitus without complication ([place])   . Breast mass x 3 mos    felt by MD, pt does not feel it    Past Surgical History  Procedure Laterality Date  . Other surgical history Left ankle surgery  . Salpingectomy Bilateral   . Ankle surgery Left   . Breast cyst aspiration Right age 25    Family History  Problem Relation Age of Onset  . Depression Mother   . Hypertension Mother   . Diabetes Mother   . Hypertension Father   . Hypertension Sister   . Depression Brother   . Breast cancer Neg Hx     Social History   Social History  .  Marital Status: Single    Spouse Name: N/A  . Number of Children: N/A  . Years of Education: N/A   Occupational History  . Not on file.   Social History Main Topics  . Smoking status: Current Every Day Smoker -- 1.00 packs/day for 15 years    Types: Cigarettes  . Smokeless tobacco: Not on file  . Alcohol Use: Yes     Comment: Occasionally during Holidays  . Drug Use: No  . Sexual Activity: Yes    Birth Control/ Protection: None   Other Topics Concern  . Not on file   Social History Narrative     Current outpatient prescriptions:  .  clonazePAM (KLONOPIN) 0.5 MG tablet, Take 0.5 mg by mouth daily., Disp: , Rfl:  .  metFORMIN (GLUCOPHAGE) 500 MG tablet, Take 500 mg by mouth 2 (two) times daily., Disp: , Rfl:  .  propranolol (INDERAL) 40 MG tablet, Take 40 mg by mouth 2 (two) times daily., Disp: , Rfl:   No Known Allergies   Review of Systems  HENT: Negative for sinus pressure.   Eyes: Positive for photophobia and pain.  Gastrointestinal: Positive for nausea.  Neurological: Positive for dizziness, vertigo and headaches.  Psychiatric/Behavioral: The patient has insomnia.  Objective  Filed Vitals:   12/04/15 1038  BP: 124/80  Pulse: 76  Temp: 98.6 F (37 C)  TempSrc: Oral  Resp: 18  Height: 5\' 4"  (1.626 m)  Weight: 217 lb 12.8 oz (98.793 kg)  SpO2: 98%    Physical Exam  Constitutional: She is oriented to person, place, and time and well-developed, well-nourished, and in no distress.  Cardiovascular: Normal rate and regular rhythm.   Pulmonary/Chest: Effort normal and breath sounds normal.  Neurological: She is alert and oriented to person, place, and time. She has normal strength and intact cranial nerves. Gait normal. GCS score is 15.  Nursing note and vitals reviewed.      Assessment & Plan  1. Vitamin D deficiency  - Ambulatory referral to Pain Clinic - Vitamin D (25 hydroxy)  2. Migraine with aura and without status migrainosus, not  intractable Start on Rizatriptan to be taken for migraine headaches. - CBC with Differential - Basic Metabolic Panel (BMET) - Hepatic function panel - rizatriptan (MAXALT) 10 MG tablet; Take 1 tablet (10 mg total) by mouth as needed for migraine. May repeat in 2 hours if needed. Max: 30 mg /24 hrs.  Dispense: 10 tablet; Refill: 0  3. Migrainous dizziness  - meclizine (ANTIVERT) 25 MG tablet; Take 1 tablet (25 mg total) by mouth 3 (three) times daily as needed for dizziness.  Dispense: 30 tablet; Refill: 0  4. Chronic neck and back pain  - Ambulatory referral to Pain Clinic   [facility] 12/04/2015 11:00 AM

## 2015-12-05 LAB — BASIC METABOLIC PANEL
BUN/Creatinine Ratio: 10 (ref 9–23)
BUN: 9 mg/dL (ref 6–24)
CO2: 24 mmol/L (ref 18–29)
Calcium: 9.3 mg/dL (ref 8.7–10.2)
Chloride: 101 mmol/L (ref 96–106)
Creatinine, Ser: 0.9 mg/dL (ref 0.57–1.00)
GFR calc Af Amer: 92 mL/min/{1.73_m2} (ref >59)
GFR calc non Af Amer: 80 mL/min/{1.73_m2} (ref >59)
GLUCOSE: 111 mg/dL — AB (ref 65–99)
POTASSIUM: 5.4 mmol/L — AB (ref 3.5–5.2)
SODIUM: 139 mmol/L (ref 134–144)

## 2015-12-05 LAB — CBC WITH DIFFERENTIAL/PLATELET
BASOS ABS: 0 10*3/uL (ref 0.0–0.2)
Basos: 0 %
EOS (ABSOLUTE): 0.2 10*3/uL (ref 0.0–0.4)
Eos: 2 %
Hematocrit: 43.1 % (ref 34.0–46.6)
Hemoglobin: 14.1 g/dL (ref 11.1–15.9)
Immature Grans (Abs): 0 10*3/uL (ref 0.0–0.1)
Immature Granulocytes: 0 %
Lymphocytes Absolute: 2.5 10*3/uL (ref 0.7–3.1)
Lymphs: 22 %
MCH: 29.6 pg (ref 26.6–33.0)
MCHC: 32.7 g/dL (ref 31.5–35.7)
MCV: 90 fL (ref 79–97)
MONOCYTES: 5 %
MONOS ABS: 0.6 10*3/uL (ref 0.1–0.9)
Neutrophils Absolute: 7.8 10*3/uL — ABNORMAL HIGH (ref 1.4–7.0)
Neutrophils: 71 %
Platelets: 493 10*3/uL — ABNORMAL HIGH (ref 150–379)
RBC: 4.77 x10E6/uL (ref 3.77–5.28)
RDW: 12.9 % (ref 12.3–15.4)
WBC: 11.1 10*3/uL — AB (ref 3.4–10.8)

## 2015-12-05 LAB — HEPATIC FUNCTION PANEL
ALBUMIN: 3.9 g/dL (ref 3.5–5.5)
ALT: 15 IU/L (ref 0–32)
AST: 16 IU/L (ref 0–40)
Alkaline Phosphatase: 116 IU/L (ref 39–117)
Bilirubin Total: 0.3 mg/dL (ref 0.0–1.2)
Bilirubin, Direct: 0.08 mg/dL (ref 0.00–0.40)
TOTAL PROTEIN: 7.1 g/dL (ref 6.0–8.5)

## 2015-12-05 LAB — VITAMIN D 25 HYDROXY (VIT D DEFICIENCY, FRACTURES): Vit D, 25-Hydroxy: 23 ng/mL — ABNORMAL LOW (ref 30.0–100.0)

## 2015-12-08 DIAGNOSIS — M778 Other enthesopathies, not elsewhere classified: Secondary | ICD-10-CM | POA: Insufficient documentation

## 2015-12-08 DIAGNOSIS — M5020 Other cervical disc displacement, unspecified cervical region: Secondary | ICD-10-CM | POA: Insufficient documentation

## 2015-12-08 DIAGNOSIS — M7581 Other shoulder lesions, right shoulder: Secondary | ICD-10-CM | POA: Insufficient documentation

## 2015-12-08 DIAGNOSIS — M4722 Other spondylosis with radiculopathy, cervical region: Secondary | ICD-10-CM | POA: Insufficient documentation

## 2015-12-15 ENCOUNTER — Telehealth: Payer: Self-pay

## 2015-12-15 MED ORDER — VITAMIN D (ERGOCALCIFEROL) 1.25 MG (50000 UNIT) PO CAPS: 50000.0000 [IU] | ORAL_CAPSULE | ORAL | Status: DC

## 2015-12-15 NOTE — Telephone Encounter (Signed)
Lab results have been reported to patient and a prescription for Vitamin D3 50,000 units has been sent to [facility] per Dr. Manuella Ghazi and she is to take 1 capsule once a week for 12 weeks, patient verbalized understanding.

## 2015-12-16 ENCOUNTER — Emergency Department
Admission: EM | Admit: 2015-12-16 | Discharge: 2015-12-16 | Disposition: A | Discharge status: Home / Self Care | Payer: Medicaid Other | Attending: Student | Admitting: Student

## 2015-12-16 DIAGNOSIS — F17210 Nicotine dependence, cigarettes, uncomplicated: Secondary | ICD-10-CM | POA: Insufficient documentation

## 2015-12-16 DIAGNOSIS — M5431 Sciatica, right side: Secondary | ICD-10-CM | POA: Diagnosis not present

## 2015-12-16 DIAGNOSIS — M545 Low back pain: Secondary | ICD-10-CM | POA: Diagnosis present

## 2015-12-16 DIAGNOSIS — Z79899 Other long term (current) drug therapy: Secondary | ICD-10-CM | POA: Insufficient documentation

## 2015-12-16 DIAGNOSIS — E119 Type 2 diabetes mellitus without complications: Secondary | ICD-10-CM | POA: Insufficient documentation

## 2015-12-16 DIAGNOSIS — Z7984 Long term (current) use of oral hypoglycemic drugs: Secondary | ICD-10-CM | POA: Insufficient documentation

## 2015-12-16 DIAGNOSIS — I10 Essential (primary) hypertension: Secondary | ICD-10-CM | POA: Diagnosis not present

## 2015-12-16 MED ORDER — PREDNISONE 10 MG (21) PO TBPK
ORAL_TABLET | ORAL | Status: DC
Start: 2015-12-16 — End: 2016-01-05

## 2015-12-16 MED ORDER — PREDNISONE 20 MG PO TABS
60.0000 mg | ORAL_TABLET | Freq: Once | ORAL | Status: AC
  Administered 2015-12-16: 60 mg via ORAL
  Filled 2015-12-16: qty 3

## 2015-12-16 MED ORDER — HYDROCODONE-ACETAMINOPHEN 5-325 MG PO TABS: 1.0000 | ORAL_TABLET | ORAL | Status: DC | PRN

## 2015-12-16 MED ORDER — MELOXICAM 15 MG PO TABS: 15.0000 mg | ORAL_TABLET | Freq: Every day | ORAL | Status: DC

## 2015-12-16 NOTE — ED Notes (Signed)
Pt states she has back pain to R side that radiates down her leg.  States she believes that she has sciatica and lifted laundry basket and it flared up.

## 2015-12-16 NOTE — ED Notes (Signed)
Discharge instructions reviewed with patient. Patient verbalized understanding. Patient ambulated to lobby without difficulty.   

## 2015-12-16 NOTE — ED Provider Notes (Signed)
[facility] Emergency Department Provider Note ____________________________________________  Time seen: Approximately 10:33 PM  I have reviewed the triage vital signs and the nursing notes.   HISTORY  Chief Complaint Back Pain    HPI Brenda Simmons is a 40 y.o. female who presents to the emergency department for evaluationof right lower back pain that radiates posteriorly into her right foot. She states that she has had this to some degree in the past, however this time is the worst. She states that it started after she lifted a laundry basket on Saturday. She has been taking ibuprofen with temporary relief and is taking hot showers. Pain prevents sleep. Pain is eased somewhat by position change, however she is unable to sit on the right side. No loss of bowel or bladder control.  Past Medical History  Diagnosis Date  . Hypertension   . Pre-diabetes     On Metformin.  . Bulging lumbar disc     Seen Pain Clinic at [facility]  . Partial tear of rotator cuff     Injections did not help  . Anxiety   . Depression     Seeing Mental Health Professional  . Migraines   . Blood transfusion without reported diagnosis   . Diabetes mellitus without complication ([place])   . Breast mass x 3 mos    felt by MD, pt does not feel it    Patient Active Problem List   Diagnosis Date Noted  . Migraine headache 12/04/2015  . Migrainous dizziness 12/04/2015  . Viral URI 09/11/2015  . Breast mass, right 09/11/2015  . Nonspecific abnormal electrocardiogram (ECG) (EKG) 09/08/2015  . Vitamin D deficiency 08/18/2015  . Pap smear of cervix unsatisfactory 08/18/2015  . Annual physical exam 07/30/2015  . Chronic neck and back pain 07/30/2015  . Chronic right shoulder pain 07/30/2015  . Elevated platelet count ([place]) 07/16/2015  . Persistent dry cough 07/14/2015  . Pre-diabetes 07/14/2015  . Disorder of rotator cuff 04/25/2014    Past Surgical History  Procedure Laterality  Date  . Other surgical history Left ankle surgery  . Salpingectomy Bilateral   . Ankle surgery Left   . Breast cyst aspiration Right age 6    Current Outpatient Rx  Name  Route  Sig  Dispense  Refill  . clonazePAM (KLONOPIN) 0.5 MG tablet   Oral   Take 0.5 mg by mouth daily.         Marland Kitchen HYDROcodone-acetaminophen (NORCO/VICODIN) 5-325 MG tablet   Oral   Take 1 tablet by mouth every 4 (four) hours as needed for moderate pain.   20 tablet   0   . meclizine (ANTIVERT) 25 MG tablet   Oral   Take 1 tablet (25 mg total) by mouth 3 (three) times daily as needed for dizziness.   30 tablet   0   . meloxicam (MOBIC) 15 MG tablet   Oral   Take 1 tablet (15 mg total) by mouth daily.   30 tablet   0   . metFORMIN (GLUCOPHAGE) 500 MG tablet   Oral   Take 500 mg by mouth 2 (two) times daily.         . predniSONE (STERAPRED UNI-PAK 21 TAB) 10 MG (21) TBPK tablet      Take 6 tablets on day 1 Take 5 tablets on day 2 Take 4 tablets on day 3 Take 3 tablets on day 4 Take 2 tablets on day 5 Take 1 tablet on day 6  21 tablet   0   . propranolol (INDERAL) 40 MG tablet   Oral   Take 40 mg by mouth 2 (two) times daily.         . rizatriptan (MAXALT) 10 MG tablet   Oral   Take 1 tablet (10 mg total) by mouth as needed for migraine. May repeat in 2 hours if needed. Max: 30 mg /24 hrs.   10 tablet   0   . Vitamin D, Ergocalciferol, (DRISDOL) 50000 units CAPS capsule   Oral   Take 1 capsule (50,000 Units total) by mouth once a week. For 12 weeks   12 capsule   0     Allergies Review of patient's allergies indicates no known allergies.  Family History  Problem Relation Age of Onset  . Depression Mother   . Hypertension Mother   . Diabetes Mother   . Hypertension Father   . Hypertension Sister   . Depression Brother   . Breast cancer Neg Hx     Social History Social History  Substance Use Topics  . Smoking status: Current Every Day Smoker -- 1.00 packs/day for  15 years    Types: Cigarettes  . Smokeless tobacco: None  . Alcohol Use: Yes     Comment: Occasionally during Holidays    Review of Systems Constitutional: No recent illness. Eyes: No visual changes. ENT: No sore throat. Cardiovascular: Denies chest pain or palpitations. Respiratory: Denies shortness of breath. Gastrointestinal: No abdominal pain.  Genitourinary: Negative for dysuria. Musculoskeletal: Pain in right lower back with radiation into the right foot. Skin: Negative for rash. Neurological: Negative for headaches, focal weakness or numbness. 10-point ROS otherwise unremarkable.  ____________________________________________   PHYSICAL EXAM:  VITAL SIGNS: ED Triage Vitals  Enc Vitals Group     BP 12/16/15 2148 118/66 mmHg     Pulse Rate 12/16/15 2148 83     Resp 12/16/15 2148 18     Temp 12/16/15 2148 98.6 F (37 C)     Temp Source 12/16/15 2148 Oral     SpO2 12/16/15 2148 97 %     Weight --      Height --      Head Cir --      Peak Flow --      Pain Score 12/16/15 2149 9     Pain Loc --      Pain Edu? --      Excl. in [place]? --     Constitutional: Alert and oriented. Well appearing and in no acute distress. Eyes: Conjunctivae are normal. EOMI. Head: Atraumatic. Nose: No congestion/rhinnorhea. Neck: No stridor.  Respiratory: Normal respiratory effort.   Musculoskeletal: Straight leg raise positive on the right at approximately 40. Neurologic:  Normal speech and language. No gross focal neurologic deficits are appreciated. Speech is normal. No gait instability. Skin:  Skin is warm, dry and intact. Atraumatic. Psychiatric: Mood and affect are normal. Speech and behavior are normal.  ____________________________________________   LABS (all labs ordered are listed, but only abnormal results are displayed)  Labs Reviewed - No data to display ____________________________________________  RADIOLOGY  Not  indicated ____________________________________________   PROCEDURES  Procedure(s) performed: None   ____________________________________________   INITIAL IMPRESSION / ASSESSMENT AND PLAN / ED COURSE  Pertinent labs & imaging results that were available during my care of the patient were reviewed by me and considered in my medical decision making (see chart for details).  Patient was advised to follow-up with either her orthopedist  or her primary care provider for symptoms that are not improving over the next week. She will take a prednisone taper as prescribed. She was advised to adhere to her diabetic diet and was advised that the prednisone may cause an increase in glucose levels. She was advised to return to the emergency department for symptoms that change or worsen if she is unable schedule an appointment. ____________________________________________   FINAL CLINICAL IMPRESSION(S) / ED DIAGNOSES  Final diagnoses:  Sciatica of right side       Victorino Dike, FNP 12/16/15 2240  Joanne Gavel, MD 12/17/15 [phone]

## 2015-12-18 ENCOUNTER — Encounter: Payer: Self-pay | Admitting: Family Medicine

## 2015-12-18 ENCOUNTER — Ambulatory Visit (INDEPENDENT_AMBULATORY_CARE_PROVIDER_SITE_OTHER): Payer: Medicaid Other | Admitting: Family Medicine

## 2015-12-18 VITALS — BP 110/70 | HR 74 | Temp 98.2°F | Resp 16 | Ht 64.0 in | Wt 220.8 lb

## 2015-12-18 DIAGNOSIS — M5441 Lumbago with sciatica, right side: Secondary | ICD-10-CM

## 2015-12-18 DIAGNOSIS — G43109 Migraine with aura, not intractable, without status migrainosus: Secondary | ICD-10-CM

## 2015-12-18 MED ORDER — BUTALBITAL-ASPIRIN-CAFFEINE 50-325-40 MG PO CAPS: 1.0000 | ORAL_CAPSULE | Freq: Three times a day (TID) | ORAL | Status: DC | PRN

## 2015-12-18 NOTE — Progress Notes (Signed)
Name: Brenda Simmons   MRN: [id number]    DOB: 1976/02/15   Date:12/18/2015       Progress Note  Subjective  Chief Complaint  Chief Complaint  Patient presents with  . Back Pain  . Hip Pain    right hip  . Headache    follow up    HPI  Right sided Lower back and Hip Pain: Pt. Presents for evaluation and follow up of right sided low back pain and right sided hip pain, present intermittently. Worse since 5 days ago. Was seen in the ER on 3/21 and was diagnosed with Sciatica. Started on Prednisone taper, Hydrocodone 5-325 mg, and which helped to some extent. She wants to be seen by Orthopedics, who is requesting a referral from primary care.  Headaches: Present in the temples area and forehead, back of the head, and top of the head. Rated at 9/10. She was started on Maxalt at her last appointment 2 weeks ago but it does not help.  Past Medical History  Diagnosis Date  . Hypertension   . Pre-diabetes     On Metformin.  . Bulging lumbar disc     Seen Pain Clinic at [facility]  . Partial tear of rotator cuff     Injections did not help  . Anxiety   . Depression     Seeing Mental Health Professional  . Migraines   . Blood transfusion without reported diagnosis   . Diabetes mellitus without complication ([place])   . Breast mass x 3 mos    felt by MD, pt does not feel it    Past Surgical History  Procedure Laterality Date  . Other surgical history Left ankle surgery  . Salpingectomy Bilateral   . Ankle surgery Left   . Breast cyst aspiration Right age 40    Family History  Problem Relation Age of Onset  . Depression Mother   . Hypertension Mother   . Diabetes Mother   . Hypertension Father   . Hypertension Sister   . Depression Brother   . Breast cancer Neg Hx     Social History   Social History  . Marital Status: Single    Spouse Name: N/A  . Number of Children: N/A  . Years of Education: N/A   Occupational History  . Not on file.   Social History Main Topics    . Smoking status: Current Every Day Smoker -- 1.00 packs/day for 15 years    Types: Cigarettes  . Smokeless tobacco: Not on file  . Alcohol Use: Yes     Comment: Occasionally during Holidays  . Drug Use: No  . Sexual Activity: Yes    Birth Control/ Protection: None   Other Topics Concern  . Not on file   Social History Narrative     Current outpatient prescriptions:  .  clonazePAM (KLONOPIN) 0.5 MG tablet, Take 0.5 mg by mouth daily., Disp: , Rfl:  .  HYDROcodone-acetaminophen (NORCO/VICODIN) 5-325 MG tablet, Take 1 tablet by mouth every 4 (four) hours as needed for moderate pain., Disp: 20 tablet, Rfl: 0 .  meclizine (ANTIVERT) 25 MG tablet, Take 1 tablet (25 mg total) by mouth 3 (three) times daily as needed for dizziness., Disp: 30 tablet, Rfl: 0 .  meloxicam (MOBIC) 15 MG tablet, Take 1 tablet (15 mg total) by mouth daily., Disp: 30 tablet, Rfl: 0 .  metFORMIN (GLUCOPHAGE) 500 MG tablet, Take 500 mg by mouth 2 (two) times daily., Disp: , Rfl:  .  predniSONE (STERAPRED UNI-PAK 21 TAB) 10 MG (21) TBPK tablet, Take 6 tablets on day 1 Take 5 tablets on day 2 Take 4 tablets on day 3 Take 3 tablets on day 4 Take 2 tablets on day 5 Take 1 tablet on day 6, Disp: 21 tablet, Rfl: 0 .  propranolol (INDERAL) 40 MG tablet, Take 40 mg by mouth 2 (two) times daily., Disp: , Rfl:  .  rizatriptan (MAXALT) 10 MG tablet, Take 1 tablet (10 mg total) by mouth as needed for migraine. May repeat in 2 hours if needed. Max: 30 mg /24 hrs., Disp: 10 tablet, Rfl: 0 .  Vitamin D, Ergocalciferol, (DRISDOL) 50000 units CAPS capsule, Take 1 capsule (50,000 Units total) by mouth once a week. For 12 weeks, Disp: 12 capsule, Rfl: 0  No Known Allergies   Review of Systems  Constitutional: Negative for fever and chills.  Eyes: Positive for blurred vision. Negative for double vision.  Gastrointestinal: Negative for nausea and vomiting.  Musculoskeletal: Positive for back pain and joint pain.  Neurological:  Positive for headaches.     Objective  Filed Vitals:   12/18/15 1346  BP: 110/70  Pulse: 74  Temp: 98.2 F (36.8 C)  TempSrc: Oral  Resp: 16  Height: 5\' 4"  (1.626 m)  Weight: 220 lb 12.8 oz (100.154 kg)  SpO2: 99%    Physical Exam  Constitutional: She is well-developed, well-nourished, and in no distress.  HENT:  Head: Normocephalic and atraumatic.  Pressure-like sensation over the temples bilaterally.  Cardiovascular: Normal rate and regular rhythm.   Pulmonary/Chest: Effort normal and breath sounds normal.  Abdominal: Soft. Bowel sounds are normal.  Musculoskeletal:       Right hip: She exhibits tenderness.       Legs: Tenderness to palpation over the right gluteal area, pain traveling down her posterior right leg.  Nursing note and vitals reviewed.      Assessment & Plan  1. Migraine with aura and without status migrainosus, not intractable  Versus tension headaches. Will start on Fiorinal. Asked to DC prednisone, meloxicam, and hydrocodone while she is on Fiorinal. Follow-up if no clinical improvement.  - butalbital-aspirin-caffeine (FIORINAL) 50-325-40 MG capsule; Take 1 capsule by mouth 3 (three) times daily as needed for headache.  Dispense: 21 capsule; Refill: 0  2. Right-sided low back pain with right-sided sciatica  - Ambulatory referral to Orthopedic Surgery    [facility] 12/18/2015 2:20 PM

## 2016-01-05 ENCOUNTER — Ambulatory Visit (INDEPENDENT_AMBULATORY_CARE_PROVIDER_SITE_OTHER): Payer: Medicaid Other | Admitting: Family Medicine

## 2016-01-05 ENCOUNTER — Encounter: Payer: Self-pay | Admitting: Family Medicine

## 2016-01-05 VITALS — BP 108/75 | HR 73 | Temp 98.2°F | Resp 16 | Ht 64.0 in | Wt 221.4 lb

## 2016-01-05 DIAGNOSIS — G43009 Migraine without aura, not intractable, without status migrainosus: Secondary | ICD-10-CM

## 2016-01-05 NOTE — Progress Notes (Signed)
Name: Brenda Simmons   MRN: [id number]    DOB: Jan 09, 1976   Date:01/05/2016       Progress Note  Subjective  Chief Complaint  Chief Complaint  Patient presents with  . Follow-up    1 mo    HPI  Pt. Presents for follow up of Migraine headache. She was started on Fiorinal 5-325-40 mg three times daily as needed for headache. She reports only minima relief of migraine headaches with Fiorinal.   Past Medical History  Diagnosis Date  . Hypertension   . Pre-diabetes     On Metformin.  . Bulging lumbar disc     Seen Pain Clinic at [facility]  . Partial tear of rotator cuff     Injections did not help  . Anxiety   . Depression     Seeing Mental Health Professional  . Migraines   . Blood transfusion without reported diagnosis   . Diabetes mellitus without complication ([place])   . Breast mass x 3 mos    felt by MD, pt does not feel it    Past Surgical History  Procedure Laterality Date  . Other surgical history Left ankle surgery  . Salpingectomy Bilateral   . Ankle surgery Left   . Breast cyst aspiration Right age 52    Family History  Problem Relation Age of Onset  . Depression Mother   . Hypertension Mother   . Diabetes Mother   . Hypertension Father   . Hypertension Sister   . Depression Brother   . Breast cancer Neg Hx     Social History   Social History  . Marital Status: Single    Spouse Name: N/A  . Number of Children: N/A  . Years of Education: N/A   Occupational History  . Not on file.   Social History Main Topics  . Smoking status: Current Every Day Smoker -- 1.00 packs/day for 15 years    Types: Cigarettes  . Smokeless tobacco: Not on file  . Alcohol Use: Yes     Comment: Occasionally during Holidays  . Drug Use: No  . Sexual Activity: Yes    Birth Control/ Protection: None   Other Topics Concern  . Not on file   Social History Narrative     Current outpatient prescriptions:  .  butalbital-aspirin-caffeine (FIORINAL) 50-325-40 MG  capsule, Take 1 capsule by mouth 3 (three) times daily as needed for headache., Disp: 21 capsule, Rfl: 0 .  clonazePAM (KLONOPIN) 0.5 MG tablet, Take 0.5 mg by mouth daily., Disp: , Rfl:  .  meclizine (ANTIVERT) 25 MG tablet, Take 1 tablet (25 mg total) by mouth 3 (three) times daily as needed for dizziness., Disp: 30 tablet, Rfl: 0 .  metFORMIN (GLUCOPHAGE) 500 MG tablet, Take 500 mg by mouth 2 (two) times daily., Disp: , Rfl:  .  propranolol (INDERAL) 40 MG tablet, Take 40 mg by mouth 2 (two) times daily., Disp: , Rfl:  .  Vitamin D, Ergocalciferol, (DRISDOL) 50000 units CAPS capsule, Take 1 capsule (50,000 Units total) by mouth once a week. For 12 weeks, Disp: 12 capsule, Rfl: 0  No Known Allergies   Review of Systems  Eyes: Positive for blurred vision.  Gastrointestinal: Positive for nausea. Negative for vomiting.  Neurological: Positive for dizziness and headaches.    Objective  Filed Vitals:   01/05/16 0950  BP: 108/75  Pulse: 73  Temp: 98.2 F (36.8 C)  TempSrc: Oral  Resp: 16  Height: 5\' 4"  (1.626 m)  Weight: 221 lb 6.4 oz (100.426 kg)  SpO2: 96%    Physical Exam  Constitutional: She is oriented to person, place, and time and well-developed, well-nourished, and in no distress.  Cardiovascular: Normal rate and regular rhythm.   Pulmonary/Chest: Effort normal and breath sounds normal.  Neurological: She is alert and oriented to person, place, and time.  Nursing note and vitals reviewed.    Assessment & Plan  1. Migraine without aura and without status migrainosus, not intractable Patient will be referred to neurology for management of chronic migraine not responsive to Maxalt and Fiorinal. - Ambulatory referral to Neurology   [facility] 01/05/2016 10:00 AM

## 2016-01-12 ENCOUNTER — Telehealth: Payer: Self-pay | Admitting: Family Medicine

## 2016-01-12 NOTE — Telephone Encounter (Signed)
Pt would like a update on her referral to the Orthopaedic. Pt states she already going for her shoulder and needs a new referral for her back and hip. Please advise pt.

## 2016-01-14 NOTE — Telephone Encounter (Signed)
Referral to orthopedics was placed during her appointment on 12/18/2015.

## 2016-01-14 NOTE — Telephone Encounter (Signed)
Routed to Dr. Manuella Ghazi for new referral

## 2016-01-20 DIAGNOSIS — G44229 Chronic tension-type headache, not intractable: Secondary | ICD-10-CM | POA: Insufficient documentation

## 2016-01-20 DIAGNOSIS — G479 Sleep disorder, unspecified: Secondary | ICD-10-CM | POA: Insufficient documentation

## 2016-02-12 ENCOUNTER — Ambulatory Visit: Payer: Medicaid Other | Admitting: Pain Medicine

## 2016-03-22 ENCOUNTER — Ambulatory Visit: Payer: Medicaid Other

## 2016-04-01 ENCOUNTER — Encounter: Payer: Self-pay | Admitting: Emergency Medicine

## 2016-04-01 ENCOUNTER — Emergency Department
Admission: EM | Admit: 2016-04-01 | Discharge: 2016-04-01 | Disposition: A | Discharge status: Home / Self Care | Payer: Medicaid Other | Attending: Emergency Medicine | Admitting: Emergency Medicine

## 2016-04-01 DIAGNOSIS — F17210 Nicotine dependence, cigarettes, uncomplicated: Secondary | ICD-10-CM | POA: Insufficient documentation

## 2016-04-01 DIAGNOSIS — I10 Essential (primary) hypertension: Secondary | ICD-10-CM | POA: Insufficient documentation

## 2016-04-01 DIAGNOSIS — F329 Major depressive disorder, single episode, unspecified: Secondary | ICD-10-CM | POA: Insufficient documentation

## 2016-04-01 DIAGNOSIS — Z7984 Long term (current) use of oral hypoglycemic drugs: Secondary | ICD-10-CM | POA: Insufficient documentation

## 2016-04-01 DIAGNOSIS — M5412 Radiculopathy, cervical region: Secondary | ICD-10-CM

## 2016-04-01 DIAGNOSIS — E119 Type 2 diabetes mellitus without complications: Secondary | ICD-10-CM | POA: Insufficient documentation

## 2016-04-01 MED ORDER — TRAMADOL HCL 50 MG PO TABS
50.0000 mg | ORAL_TABLET | Freq: Once | ORAL | Status: AC
  Administered 2016-04-01: 50 mg via ORAL
  Filled 2016-04-01: qty 1

## 2016-04-01 MED ORDER — CYCLOBENZAPRINE HCL 10 MG PO TABS
10.0000 mg | ORAL_TABLET | Freq: Once | ORAL | Status: AC
  Administered 2016-04-01: 10 mg via ORAL
  Filled 2016-04-01: qty 1

## 2016-04-01 MED ORDER — PREDNISONE 10 MG (21) PO TBPK: ORAL_TABLET | ORAL | Status: DC

## 2016-04-01 MED ORDER — TRAMADOL HCL 50 MG PO TABS: 50.0000 mg | ORAL_TABLET | Freq: Four times a day (QID) | ORAL | Status: DC | PRN

## 2016-04-01 MED ORDER — PREDNISONE 20 MG PO TABS
60.0000 mg | ORAL_TABLET | Freq: Once | ORAL | Status: AC
  Administered 2016-04-01: 60 mg via ORAL
  Filled 2016-04-01: qty 3

## 2016-04-01 MED ORDER — CYCLOBENZAPRINE HCL 10 MG PO TABS: 10.0000 mg | ORAL_TABLET | Freq: Three times a day (TID) | ORAL | Status: DC | PRN

## 2016-04-01 NOTE — ED Provider Notes (Signed)
[facility] Emergency Department Provider Note ____________________________________________  Time seen: Approximately 10:00 PM  I have reviewed the triage vital signs and the nursing notes.   HISTORY  Chief Complaint Neck Pain and Shoulder Pain    HPI Brenda Simmons is a 40 y.o. female who presents to the emergency department for evaluation of neck pain that radiates into her right shoulder down to her hand. She denies recent injury, but states she has had this pain in the past and diagnosed with a pinched nerve and bulging disc. She has been unable to see Dr. Roland Rack due to an insurance mix up. Symptoms have been present for the past week after "cleaning". No relief with tylenol.  Past Medical History  Diagnosis Date  . Hypertension   . Pre-diabetes     On Metformin.  . Bulging lumbar disc     Seen Pain Clinic at [facility]  . Partial tear of rotator cuff     Injections did not help  . Anxiety   . Depression     Seeing Mental Health Professional  . Migraines   . Blood transfusion without reported diagnosis   . Diabetes mellitus without complication ([place])   . Breast mass x 3 mos    felt by MD, pt does not feel it    Patient Active Problem List   Diagnosis Date Noted  . Right-sided low back pain with right-sided sciatica 12/18/2015  . Migraine headache 12/04/2015  . Migrainous dizziness 12/04/2015  . Viral URI 09/11/2015  . Breast mass, right 09/11/2015  . Nonspecific abnormal electrocardiogram (ECG) (EKG) 09/08/2015  . Vitamin D deficiency 08/18/2015  . Pap smear of cervix unsatisfactory 08/18/2015  . Annual physical exam 07/30/2015  . Chronic neck and back pain 07/30/2015  . Chronic right shoulder pain 07/30/2015  . 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 Persistent dry cough 07/14/2015  . Pre-diabetes 07/14/2015  . Disorder of rotator cuff 04/25/2014    Past Surgical History  Procedure Laterality Date  . Other surgical history Left  ankle surgery  . Salpingectomy Bilateral   . Ankle surgery Left   . Breast cyst aspiration Right age 41    Current Outpatient Rx  Name  Route  Sig  Dispense  Refill  . butalbital-aspirin-caffeine (FIORINAL) 50-325-40 MG capsule   Oral   Take 1 capsule by mouth 3 (three) times daily as needed for headache.   21 capsule   0   . clonazePAM (KLONOPIN) 0.5 MG tablet   Oral   Take 0.5 mg by mouth daily.         . meclizine (ANTIVERT) 25 MG tablet   Oral   Take 1 tablet (25 mg total) by mouth 3 (three) times daily as needed for dizziness.   30 tablet   0   . metFORMIN (GLUCOPHAGE) 500 MG tablet   Oral   Take 500 mg by mouth 2 (two) times daily.         . propranolol (INDERAL) 40 MG tablet   Oral   Take 40 mg by mouth 2 (two) times daily.         . Vitamin D, Ergocalciferol, (DRISDOL) 50000 units CAPS capsule   Oral   Take 1 capsule (50,000 Units total) by mouth once a week. For 12 weeks   12 capsule   0     Allergies Review of patient's allergies indicates no known allergies.  Family History  Problem Relation Age of Onset  . Depression Mother   .  Hypertension Mother   . Diabetes Mother   . Hypertension Father   . Hypertension Sister   . Depression Brother   . Breast cancer Neg Hx     Social History Social History  Substance Use Topics  . Smoking status: Current Every Day Smoker -- 0.50 packs/day for 15 years    Types: Cigarettes  . Smokeless tobacco: None  . Alcohol Use: Yes     Comment: Occasionally during Holidays    Review of Systems Constitutional: No recent illness. Cardiovascular: Denies chest pain or palpitations. Respiratory: Denies shortness of breath. Musculoskeletal: Pain in right neck with radiation into right arm and hand. Skin: Negative for rash, wound, lesion. Neurological: Negative for focal weakness or numbness.  ____________________________________________   PHYSICAL EXAM:  VITAL SIGNS: ED Triage Vitals  Enc Vitals Group      BP 04/01/16 2013 146/88 mmHg     Pulse Rate 04/01/16 2013 91     Resp 04/01/16 2013 18     Temp 04/01/16 2013 98.9 F (37.2 C)     Temp Source 04/01/16 2013 Oral     SpO2 04/01/16 2013 98 %     Weight 04/01/16 2013 217 lb (98.431 kg)     Height 04/01/16 2013 5\' 4"  (1.626 m)     Head Cir --      Peak Flow --      Pain Score 04/01/16 2027 9     Pain Loc --      Pain Edu? --      Excl. in [place]? --     Constitutional: Alert and oriented. Well appearing and in no acute distress. Eyes: Conjunctivae are normal. EOMI. Head: Atraumatic. Neck: No stridor.  Respiratory: Normal respiratory effort.   Musculoskeletal: Nexus criteria negative. Pain over right posterior neck that radiates behind the right shoulder and down the right arm into the hand.  Neurologic:  Normal speech and language. No gross focal neurologic deficits are appreciated. Speech is normal. No gait instability. Skin:  Skin is warm, dry and intact. Atraumatic. Psychiatric: Mood and affect are normal. Speech and behavior are normal.  ____________________________________________   LABS (all labs ordered are listed, but only abnormal results are displayed)  Labs Reviewed - No data to display ____________________________________________  RADIOLOGY  Not indicated ____________________________________________   PROCEDURES  Procedure(s) performed: None   ____________________________________________   INITIAL IMPRESSION / ASSESSMENT AND PLAN / ED COURSE  Pertinent labs & imaging results that were available during my care of the patient were reviewed by me and considered in my medical decision making (see chart for details).  Prednisone, flexeril, and tramadol prescribed. She is to schedule an appointment with orthopedics as soon as possible. She is to return to the ER for symptoms that change or worsen if unable to schedule an appointment. ____________________________________________   FINAL CLINICAL  IMPRESSION(S) / ED DIAGNOSES  Final diagnoses:  Cervical radiculopathy       Victorino Dike, FNP 04/01/16 2246  Nena Polio, MD 04/01/16 2358

## 2016-04-01 NOTE — ED Notes (Signed)
Patient presents to ED with c/o neck pain and RIGHT shoulder pain with tingling and numbness for about a week. Pt reports was diagnosed with pinch nerve and bulging disc. States it feels the same. Pt denies chest pain, dizziness or shortness of breath. Bilateral hand grips, (+) movement, sensation and radial pulse. Pt alert and oriented x 4, skin warm and dry.

## 2016-04-01 NOTE — Discharge Instructions (Signed)

## 2016-06-09 ENCOUNTER — Emergency Department
Admission: EM | Admit: 2016-06-09 | Discharge: 2016-06-09 | Disposition: A | Discharge status: Home / Self Care | Payer: Medicaid Other | Attending: Emergency Medicine | Admitting: Emergency Medicine

## 2016-06-09 DIAGNOSIS — Z7984 Long term (current) use of oral hypoglycemic drugs: Secondary | ICD-10-CM | POA: Insufficient documentation

## 2016-06-09 DIAGNOSIS — F17210 Nicotine dependence, cigarettes, uncomplicated: Secondary | ICD-10-CM | POA: Insufficient documentation

## 2016-06-09 DIAGNOSIS — Z79899 Other long term (current) drug therapy: Secondary | ICD-10-CM | POA: Insufficient documentation

## 2016-06-09 DIAGNOSIS — E119 Type 2 diabetes mellitus without complications: Secondary | ICD-10-CM | POA: Insufficient documentation

## 2016-06-09 DIAGNOSIS — I10 Essential (primary) hypertension: Secondary | ICD-10-CM | POA: Insufficient documentation

## 2016-06-09 DIAGNOSIS — J209 Acute bronchitis, unspecified: Secondary | ICD-10-CM | POA: Insufficient documentation

## 2016-06-09 MED ORDER — IBUPROFEN 600 MG PO TABS: 600.0000 mg | ORAL_TABLET | Freq: Three times a day (TID) | ORAL | 0 refills | Status: DC | PRN

## 2016-06-09 MED ORDER — HYDROCOD POLST-CPM POLST ER 10-8 MG/5ML PO SUER
5.0000 mL | Freq: Once | ORAL | Status: AC
  Administered 2016-06-09: 5 mL via ORAL
  Filled 2016-06-09: qty 5

## 2016-06-09 MED ORDER — HYDROCOD POLST-CPM POLST ER 10-8 MG/5ML PO SUER: 5.0000 mL | Freq: Two times a day (BID) | ORAL | 0 refills | Status: DC

## 2016-06-09 MED ORDER — AZITHROMYCIN 250 MG PO TABS: ORAL_TABLET | ORAL | 0 refills | Status: AC

## 2016-06-09 NOTE — ED Provider Notes (Signed)
[facility] Emergency Department Provider Note   ____________________________________________   First MD Initiated Contact with Patient 06/09/16 1824     (approximate)  I have reviewed the triage vital signs and the nursing notes.   HISTORY  Chief Complaint Bronchitis and Cough    HPI MELDA SIEBELS is a 40 y.o. female patient complaining of cough and chest congestion for one week. Patient states she coughs causes chest and back pain. Patient states cough is productive and is a thick brown sputum. Patient denies nasal congestion runny nose. Patient denies nausea vomiting diarrhea. Patient denies fever.  Past Medical History:  Diagnosis Date  . Anxiety   . Blood transfusion without reported diagnosis   . Breast mass x 3 mos   felt by MD, pt does not feel it  . Bulging lumbar disc    Seen Pain Clinic at [facility]  . Depression    Seeing Mental Health Professional  . Diabetes mellitus without complication ([place])   . Hypertension   . Migraines   . Partial tear of rotator cuff    Injections did not help  . Pre-diabetes    On Metformin.    Patient Active Problem List   Diagnosis Date Noted  . Right-sided low back pain with right-sided sciatica 12/18/2015  . Migraine headache 12/04/2015  . Migrainous dizziness 12/04/2015  . Viral URI 09/11/2015  . Breast mass, right 09/11/2015  . Nonspecific abnormal electrocardiogram (ECG) (EKG) 09/08/2015  . Vitamin D deficiency 08/18/2015  . Pap smear of cervix unsatisfactory 08/18/2015  . Annual physical exam 07/30/2015  . Chronic neck and back pain 07/30/2015  . Chronic right shoulder pain 07/30/2015  . 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 Persistent dry cough 07/14/2015  . Pre-diabetes 07/14/2015  . Disorder of rotator cuff 04/25/2014    Past Surgical History:  Procedure Laterality Date  . ANKLE SURGERY Left   . BREAST CYST ASPIRATION Right age 77  . OTHER SURGICAL HISTORY Left ankle surgery    . SALPINGECTOMY Bilateral     Prior to Admission medications   Medication Sig Start Date End Date Taking? Authorizing Provider  azithromycin (ZITHROMAX Z-PAK) 250 MG tablet Take 2 tablets (500 mg) on  Day 1,  followed by 1 tablet (250 mg) once daily on Days 2 through 5. 06/09/16 06/14/16  Sable Feil, PA-C  butalbital-aspirin-caffeine [facility]) [phone] MG capsule Take 1 capsule by mouth 3 (three) times daily as needed for headache. 12/18/15   Roselee Nova, MD  chlorpheniramine-HYDROcodone (TUSSIONEX PENNKINETIC ER) 10-8 MG/5ML SUER Take 5 mLs by mouth 2 (two) times daily. 06/09/16   Sable Feil, PA-C  clonazePAM (KLONOPIN) 0.5 MG tablet Take 0.5 mg by mouth daily.    Historical Provider, MD  cyclobenzaprine (FLEXERIL) 10 MG tablet Take 1 tablet (10 mg total) by mouth 3 (three) times daily as needed for muscle spasms. 04/01/16   Victorino Dike, FNP  ibuprofen (ADVIL,MOTRIN) 600 MG tablet Take 1 tablet (600 mg total) by mouth every 8 (eight) hours as needed. 06/09/16   Sable Feil, PA-C  meclizine (ANTIVERT) 25 MG tablet Take 1 tablet (25 mg total) by mouth 3 (three) times daily as needed for dizziness. 12/04/15   Roselee Nova, MD  metFORMIN (GLUCOPHAGE) 500 MG tablet Take 500 mg by mouth 2 (two) times daily. 12/05/12   Historical Provider, MD  predniSONE (STERAPRED UNI-PAK 21 TAB) 10 MG (21) TBPK tablet Take 6 tablets on day 1 Take  5 tablets on day 2 Take 4 tablets on day 3 Take 3 tablets on day 4 Take 2 tablets on day 5 Take 1 tablet on day 6 04/01/16   Cari B Triplett, FNP  propranolol (INDERAL) 40 MG tablet Take 40 mg by mouth 2 (two) times daily. 12/05/12   Historical Provider, MD  traMADol (ULTRAM) 50 MG tablet Take 1 tablet (50 mg total) by mouth every 6 (six) hours as needed. 04/01/16   Victorino Dike, FNP  Vitamin D, Ergocalciferol, (DRISDOL) 50000 units CAPS capsule Take 1 capsule (50,000 Units total) by mouth once a week. For 12 weeks 12/15/15   Roselee Nova, MD     Allergies Review of patient's allergies indicates no known allergies.  Family History  Problem Relation Age of Onset  . Depression Mother   . Hypertension Mother   . Diabetes Mother   . Hypertension Father   . Hypertension Sister   . Depression Brother   . Breast cancer Neg Hx     Social History Social History  Substance Use Topics  . Smoking status: Current Every Day Smoker    Packs/day: 0.50    Years: 15.00    Types: Cigarettes  . Smokeless tobacco: Not on file  . Alcohol use Yes     Comment: Occasionally during Holidays    Review of Systems Constitutional: No fever/chills Eyes: No visual changes. ENT: No sore throat. Cardiovascular: Denies chest pain. Respiratory: Denies shortness of breath. Gastrointestinal: No abdominal pain.  No nausea, no vomiting.  No diarrhea.  No constipation. Genitourinary: Negative for dysuria. Musculoskeletal: Negative for back pain. Skin: Negative for rash. Neurological: Negative for headaches, focal weakness or numbness. Psychiatric:Anxiety and depression. Endocrine:Hypertension and prediabetic condition ____________________________________________   PHYSICAL EXAM:  VITAL SIGNS: ED Triage Vitals  Enc Vitals Group     BP 06/09/16 1745 125/90     Pulse Rate 06/09/16 1745 73     Resp 06/09/16 1745 17     Temp 06/09/16 1745 98.4 F (36.9 C)     Temp Source 06/09/16 1745 Oral     SpO2 06/09/16 1745 98 %     Weight 06/09/16 1745 217 lb (98.4 kg)     Height 06/09/16 1745 5\' 4"  (1.626 m)     Head Circumference --      Peak Flow --      Pain Score 06/09/16 1746 8     Pain Loc --      Pain Edu? --      Excl. in [place]? --     Constitutional: Alert and oriented. Well appearing and in no acute distress. Eyes: Conjunctivae are normal. PERRL. EOMI. Head: Atraumatic. Nose: No congestion/rhinnorhea. Mouth/Throat: Mucous membranes are moist.  Oropharynx non-erythematous. Neck: No stridor.  No cervical spine tenderness to  palpation. Hematological/Lymphatic/Immunilogical: No cervical lymphadenopathy. Cardiovascular: Normal rate, regular rhythm. Grossly normal heart sounds.  Good peripheral circulation. Respiratory: Normal respiratory effort.  No retractions. Lungs bilateral Rales Gastrointestinal: Soft and nontender. No distention. No abdominal bruits. No CVA tenderness. Genitourinary:  Musculoskeletal: No lower extremity tenderness nor edema.  No joint effusions. Neurologic:  Normal speech and language. No gross focal neurologic deficits are appreciated. No gait instability. Skin:  Skin is warm, dry and intact. No rash noted. Psychiatric: Mood and affect are normal. Speech and behavior are normal.  ____________________________________________   LABS (all labs ordered are listed, but only abnormal results are displayed)  Labs Reviewed - No data to display ____________________________________________  EKG  ____________________________________________  RADIOLOGY   ____________________________________________   PROCEDURES  Procedure(s) performed: None  Procedures  Critical Care performed: No  ____________________________________________   INITIAL IMPRESSION / ASSESSMENT AND PLAN / ED COURSE  Pertinent labs & imaging results that were available during my care of the patient were reviewed by me and considered in my medical decision making (see chart for details).  Bronchitis. Patient given discharge care instructions. Patient given a prescription for Tussionex and Zithromax. Advised follow-up family doctor in 2-3 days if no improvement.  Clinical Course     ____________________________________________   FINAL CLINICAL IMPRESSION(S) / ED DIAGNOSES  Final diagnoses:  Acute bronchitis, unspecified organism      NEW MEDICATIONS STARTED DURING THIS VISIT:  New Prescriptions   AZITHROMYCIN (ZITHROMAX Z-PAK) 250 MG TABLET    Take 2 tablets (500 mg) on  Day 1,  followed by 1 tablet  (250 mg) once daily on Days 2 through 5.   CHLORPHENIRAMINE-HYDROCODONE (TUSSIONEX PENNKINETIC ER) 10-8 MG/5ML SUER    Take 5 mLs by mouth 2 (two) times daily.   IBUPROFEN (ADVIL,MOTRIN) 600 MG TABLET    Take 1 tablet (600 mg total) by mouth every 8 (eight) hours as needed.     Note:  This document was prepared using Dragon voice recognition software and may include unintentional dictation errors.    Sable Feil, PA-C 06/09/16 1831    Hinda Kehr, MD 06/09/16 [phone]

## 2016-06-09 NOTE — ED Triage Notes (Signed)
Pt with cough and hx of bronchitis. Pt with cough x1 week. No respiratory distress noted.

## 2016-06-09 NOTE — ED Triage Notes (Signed)
Pt here with c/o congestion and cough for over one week  Pt states  "I have coughed so much that I hurt in my chest and in my back."  Productive cough reported with thick dark brown sputum

## 2016-06-09 NOTE — ED Notes (Signed)
States she developed prod and some chest congestion for about 1 week  Afebrile on arrival

## 2016-07-28 ENCOUNTER — Emergency Department
Admission: EM | Admit: 2016-07-28 | Discharge: 2016-07-28 | Disposition: A | Discharge status: Home / Self Care | Payer: Self-pay | Attending: Emergency Medicine | Admitting: Emergency Medicine

## 2016-07-28 ENCOUNTER — Emergency Department: Payer: Self-pay

## 2016-07-28 DIAGNOSIS — R079 Chest pain, unspecified: Secondary | ICD-10-CM | POA: Insufficient documentation

## 2016-07-28 DIAGNOSIS — Z7984 Long term (current) use of oral hypoglycemic drugs: Secondary | ICD-10-CM | POA: Insufficient documentation

## 2016-07-28 DIAGNOSIS — R519 Headache, unspecified: Secondary | ICD-10-CM

## 2016-07-28 DIAGNOSIS — E119 Type 2 diabetes mellitus without complications: Secondary | ICD-10-CM | POA: Insufficient documentation

## 2016-07-28 DIAGNOSIS — F17210 Nicotine dependence, cigarettes, uncomplicated: Secondary | ICD-10-CM | POA: Insufficient documentation

## 2016-07-28 DIAGNOSIS — R51 Headache: Secondary | ICD-10-CM | POA: Insufficient documentation

## 2016-07-28 DIAGNOSIS — I10 Essential (primary) hypertension: Secondary | ICD-10-CM | POA: Insufficient documentation

## 2016-07-28 LAB — COMPREHENSIVE METABOLIC PANEL
ALBUMIN: 3.4 g/dL — AB (ref 3.5–5.0)
ALT: 11 U/L — AB (ref 14–54)
AST: 16 U/L (ref 15–41)
Alkaline Phosphatase: 75 U/L (ref 38–126)
Anion gap: 5 (ref 5–15)
BUN: 9 mg/dL (ref 6–20)
CHLORIDE: 107 mmol/L (ref 101–111)
CO2: 24 mmol/L (ref 22–32)
CREATININE: 0.72 mg/dL (ref 0.44–1.00)
Calcium: 8.5 mg/dL — ABNORMAL LOW (ref 8.9–10.3)
GFR calc non Af Amer: >60 mL/min (ref >60)
Glucose, Bld: 117 mg/dL — ABNORMAL HIGH (ref 65–99)
Potassium: 4 mmol/L (ref 3.5–5.1)
SODIUM: 136 mmol/L (ref 135–145)
Total Bilirubin: 0.6 mg/dL (ref 0.3–1.2)
Total Protein: 6.4 g/dL — ABNORMAL LOW (ref 6.5–8.1)

## 2016-07-28 LAB — CBC
HCT: 43.8 % (ref 35.0–47.0)
Hemoglobin: 14.5 g/dL (ref 12.0–16.0)
MCH: 30.4 pg (ref 26.0–34.0)
MCHC: 33.2 g/dL (ref 32.0–36.0)
MCV: 91.5 fL (ref 80.0–100.0)
PLATELETS: 360 10*3/uL (ref 150–440)
RBC: 4.78 MIL/uL (ref 3.80–5.20)
RDW: 13.1 % (ref 11.5–14.5)
WBC: 10.3 10*3/uL (ref 3.6–11.0)

## 2016-07-28 LAB — TROPONIN I: Troponin I: <0.03 ng/mL (ref <0.03)

## 2016-07-28 MED ORDER — KETOROLAC TROMETHAMINE 30 MG/ML IJ SOLN
30.0000 mg | Freq: Once | INTRAMUSCULAR | Status: AC
  Administered 2016-07-28: 30 mg via INTRAVENOUS
  Filled 2016-07-28: qty 1

## 2016-07-28 MED ORDER — METOCLOPRAMIDE HCL 5 MG/ML IJ SOLN
10.0000 mg | Freq: Once | INTRAMUSCULAR | Status: AC
  Administered 2016-07-28: 10 mg via INTRAVENOUS
  Filled 2016-07-28: qty 2

## 2016-07-28 MED ORDER — NORTRIPTYLINE HCL 10 MG PO CAPS: ORAL_CAPSULE | ORAL | 1 refills | Status: DC

## 2016-07-28 MED ORDER — METHYLPREDNISOLONE 4 MG PO TBPK: ORAL_TABLET | ORAL | 0 refills | Status: DC

## 2016-07-28 MED ORDER — SODIUM CHLORIDE 0.9 % IV SOLN
Freq: Once | INTRAVENOUS | Status: AC
  Administered 2016-07-28: 15:00:00 via INTRAVENOUS

## 2016-07-28 MED ORDER — DIPHENHYDRAMINE HCL 50 MG/ML IJ SOLN
25.0000 mg | Freq: Once | INTRAMUSCULAR | Status: AC
Start: 2016-07-28 — End: 2016-07-28
  Administered 2016-07-28: 25 mg via INTRAVENOUS
  Filled 2016-07-28: qty 1

## 2016-07-28 NOTE — ED Triage Notes (Signed)
Pt arrives ambulatory to triage with reports of migraine headache pain off and on for the last two months and now today she began having mid sternal chest pain  10/10 headache pain reported  Pt points to her right temple when asked to show me the pain

## 2016-07-28 NOTE — ED Notes (Signed)
Patient states, "I need to go. My mom fell and I am her caregiver and I need to leave". MD notified. Verbal order to stop IV fluids and DC patient.

## 2016-07-28 NOTE — ED Notes (Signed)
Patient c/o HA on and off x2 months. Denies any visual changes.

## 2016-07-28 NOTE — ED Provider Notes (Signed)
[facility] Emergency Department Provider Note        Time seen: ----------------------------------------- 2:39 PM on 07/28/2016 -----------------------------------------    I have reviewed the triage vital signs and the nursing notes.   HISTORY  Chief Complaint Headache and Chest Pain    HPI Brenda Simmons is a 40 y.o. female who presents to the ER for migraine headache off and on for the past 2 months. Patient states today she began having some midsternal chest pain. Headache is 10 out of 10 in both temples. She states the chest pain is abnormal for her. She denies recent illness, had been seen by neurology this year but states her Medicaid ran out and she can no longer afford to see the neurologist. She is not currently taking any headache medications. She does continue to take Inderal and leukocytes.   Past Medical History:  Diagnosis Date  . Anxiety   . Blood transfusion without reported diagnosis   . Breast mass x 3 mos   felt by MD, pt does not feel it  . Bulging lumbar disc    Seen Pain Clinic at [facility]  . Depression    Seeing Mental Health Professional  . Diabetes mellitus without complication ([place])   . Hypertension   . Migraines   . Partial tear of rotator cuff    Injections did not help  . Pre-diabetes    On Metformin.    Patient Active Problem List   Diagnosis Date Noted  . Right-sided low back pain with right-sided sciatica 12/18/2015  . Migraine headache 12/04/2015  . Migrainous dizziness 12/04/2015  . Viral URI 09/11/2015  . Breast mass, right 09/11/2015  . Nonspecific abnormal electrocardiogram (ECG) (EKG) 09/08/2015  . Vitamin D deficiency 08/18/2015  . Pap smear of cervix unsatisfactory 08/18/2015  . Annual physical exam 07/30/2015  . Chronic neck and back pain 07/30/2015  . Chronic right shoulder pain 07/30/2015  . Elevated platelet count ([place]) 07/16/2015  . Persistent dry cough 07/14/2015  . Pre-diabetes  07/14/2015  . Disorder of rotator cuff 04/25/2014    Past Surgical History:  Procedure Laterality Date  . ANKLE SURGERY Left   . BREAST CYST ASPIRATION Right age 55  . OTHER SURGICAL HISTORY Left ankle surgery  . SALPINGECTOMY Bilateral     Allergies Review of patient's allergies indicates no known allergies.  Social History Social History  Substance Use Topics  . Smoking status: Current Every Day Smoker    Packs/day: 0.50    Years: 15.00    Types: Cigarettes  . Smokeless tobacco: Never Used  . Alcohol use Yes     Comment: Occasionally during Holidays    Review of Systems Constitutional: Negative for fever. Cardiovascular: Positive for chest pain  Respiratory: Negative for shortness of breath. Gastrointestinal: Negative for abdominal pain, vomiting and diarrhea. Genitourinary: Negative for dysuria. Musculoskeletal: Negative for back pain. Skin: Negative for rash. Neurological: Positive for headache  10-point ROS otherwise negative.  ____________________________________________   PHYSICAL EXAM:  VITAL SIGNS: ED Triage Vitals  Enc Vitals Group     BP 07/28/16 1234 119/80     Pulse Rate 07/28/16 1234 78     Resp 07/28/16 1234 19     Temp 07/28/16 1234 98.6 F (37 C)     Temp src --      SpO2 07/28/16 1234 98 %     Weight 07/28/16 1234 217 lb (98.4 kg)     Height 07/28/16 1234 5\' 4"  (1.626 m)  Head Circumference --      Peak Flow --      Pain Score 07/28/16 1235 10     Pain Loc --      Pain Edu? --      Excl. in [place]? --     Constitutional: Alert and oriented. Well appearing and in no distress. Eyes: Conjunctivae are normal. PERRL. Normal extraocular movements. ENT   Head: Normocephalic and atraumatic.   Nose: No congestion/rhinnorhea.   Mouth/Throat: Mucous membranes are moist.   Neck: No stridor. Cardiovascular: Normal rate, regular rhythm. No murmurs, rubs, or gallops. Respiratory: Normal respiratory effort without tachypnea nor  retractions. Breath sounds are clear and equal bilaterally. No wheezes/rales/rhonchi. Gastrointestinal: Soft and nontender. Normal bowel sounds Musculoskeletal: Nontender with normal range of motion in all extremities. No lower extremity tenderness nor edema. Neurologic:  Normal speech and language. No gross focal neurologic deficits are appreciated.  Skin:  Skin is warm, dry and intact. No rash noted. Psychiatric: Mood and affect are normal. Speech and behavior are normal.  ____________________________________________  EKG: Interpreted by me.Sinus rhythm with rate of 66 bpm, normal PR interval, normal QRS, normal QT, normal axis.  ____________________________________________  ED COURSE:  Pertinent labs & imaging results that were available during my care of the patient were reviewed by me and considered in my medical decision making (see chart for details). Clinical Course  Patient presents to the ER in no distress. We will assess with labs and imaging if needed.  Procedures ____________________________________________   LABS (pertinent positives/negatives)  Labs Reviewed  COMPREHENSIVE METABOLIC PANEL - Abnormal; Notable for the following:       Result Value   Glucose, Bld 117 (*)    Calcium 8.5 (*)    Total Protein 6.4 (*)    Albumin 3.4 (*)    ALT 11 (*)    All other components within normal limits  CBC  TROPONIN I   ____________________________________________  FINAL ASSESSMENT AND PLAN  Headache, chest pain  Plan: Patient with labs as dictated above. Patient is in no distress, currently feeling better after IV headache cocktail. I will prescribe headache preventative medication for her to take as an outpatient.   Earleen Newport, MD   Note: This dictation was prepared with Dragon dictation. Any transcriptional errors that result from this process are unintentional    Earleen Newport, MD 07/28/16 1441

## 2016-08-02 ENCOUNTER — Encounter: Payer: Medicaid Other | Admitting: Family Medicine

## 2016-08-10 ENCOUNTER — Telehealth: Payer: Self-pay | Admitting: Family Medicine

## 2016-08-10 DIAGNOSIS — G43009 Migraine without aura, not intractable, without status migrainosus: Secondary | ICD-10-CM

## 2016-08-10 NOTE — Telephone Encounter (Signed)
Referral to Neurology has been placed.

## 2016-08-10 NOTE — Telephone Encounter (Signed)
Pt would like a referral to [facility] for her headaches. Pt does not have insurance at this time and has been approved for [facility] with [facility]. Please advise if this can be done.

## 2016-12-06 ENCOUNTER — Ambulatory Visit: Payer: Medicaid Other | Admitting: Pharmacy Technician

## 2016-12-06 NOTE — Progress Notes (Signed)
Patient scheduled for eligibility appointment at [facility].  Patient did not show for the appointment on 12/06/16  At 10:30a.m.  Patient did not reschedule eligibility appointment.  [facility] will be unable to providde additional medication assistance until eligibility is determined.  [place]

## 2017-03-09 IMAGING — CR DG CHEST 2V
1 series · 2 of 2 positions shown · non-contrast
Comparison: Two-view chest x-ray 11/05/2014.

CLINICAL DATA: Intermittent dry cough for 6 weeks.

EXAM:
CHEST - 2 VIEW

[Series 1: dg chest 2 view · 0.14mm/px · 2 of 2 slices shown]
[im 1/2]
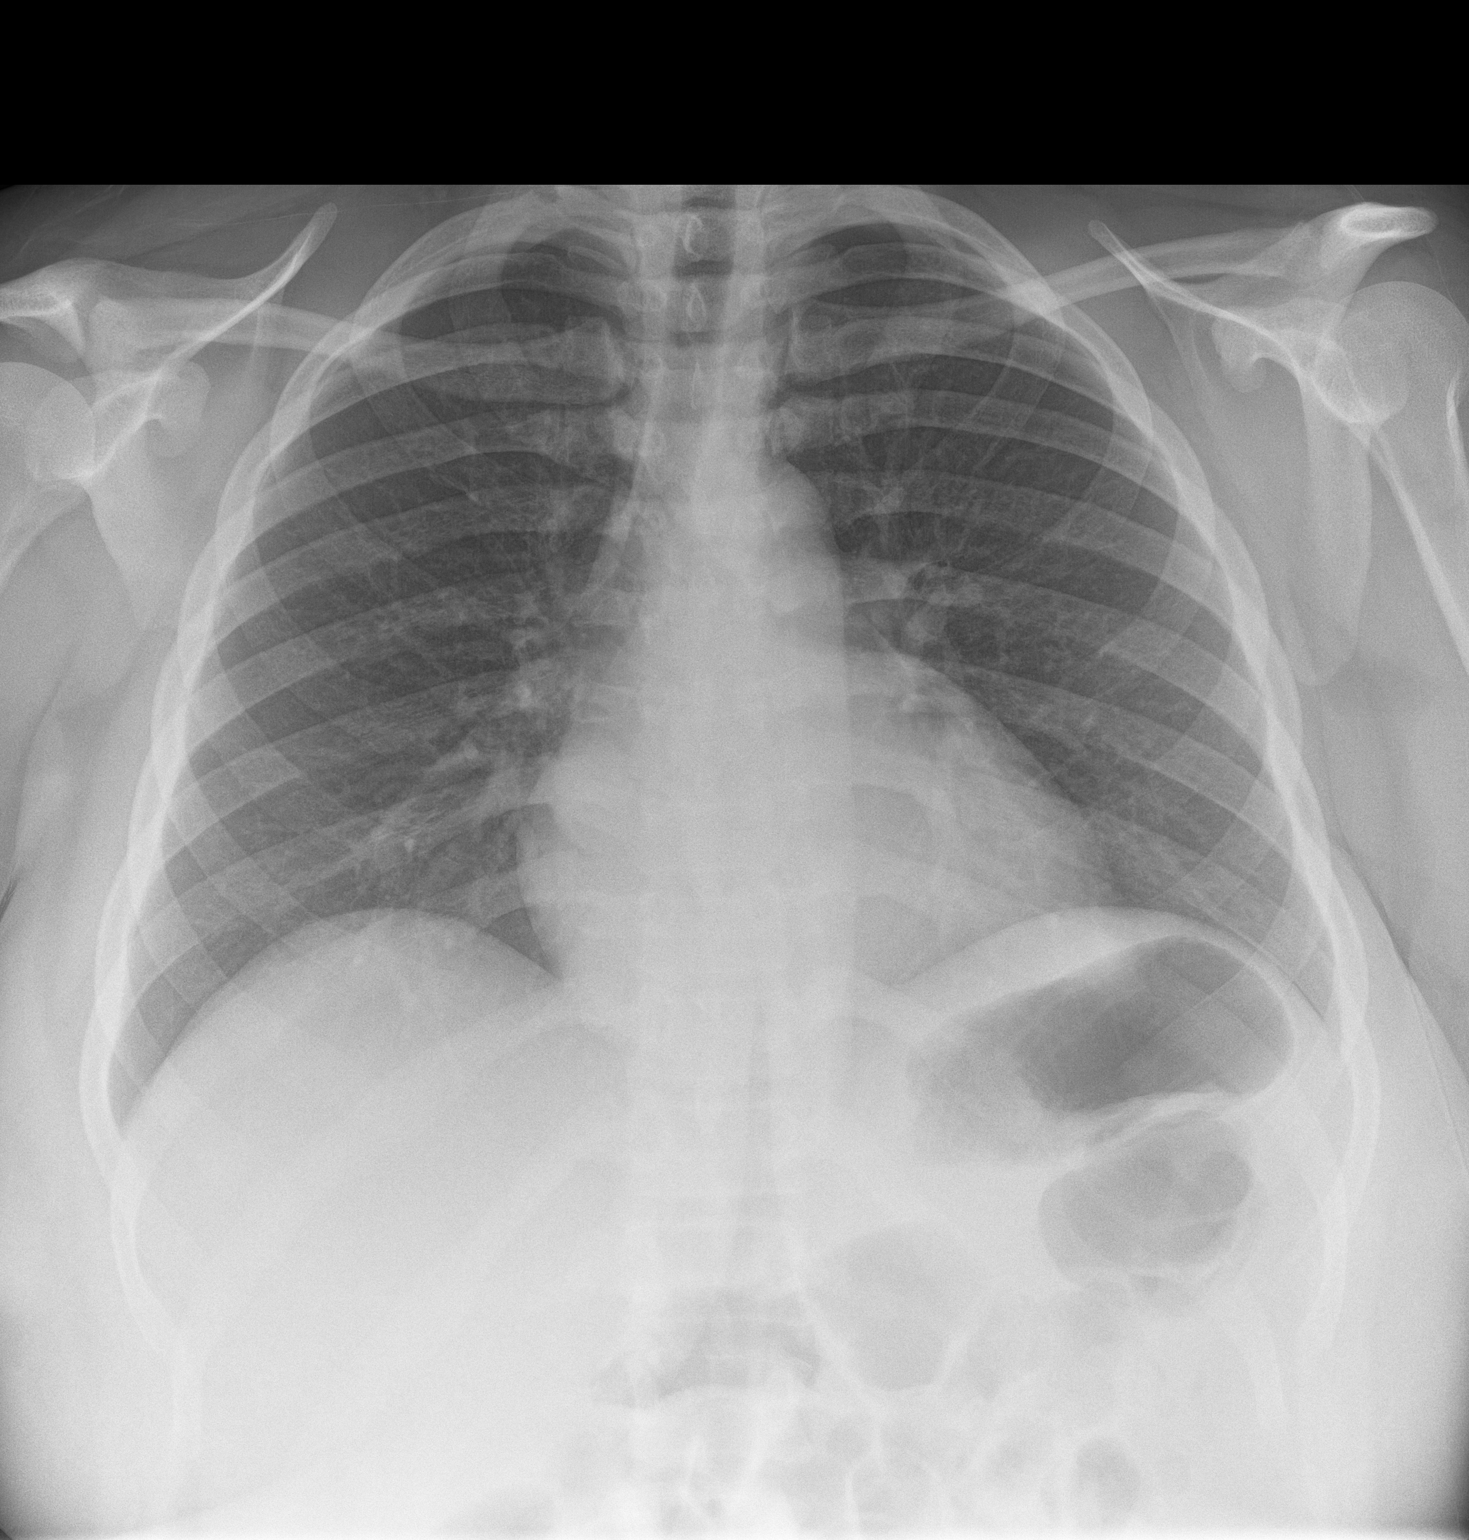
[im 2/2]
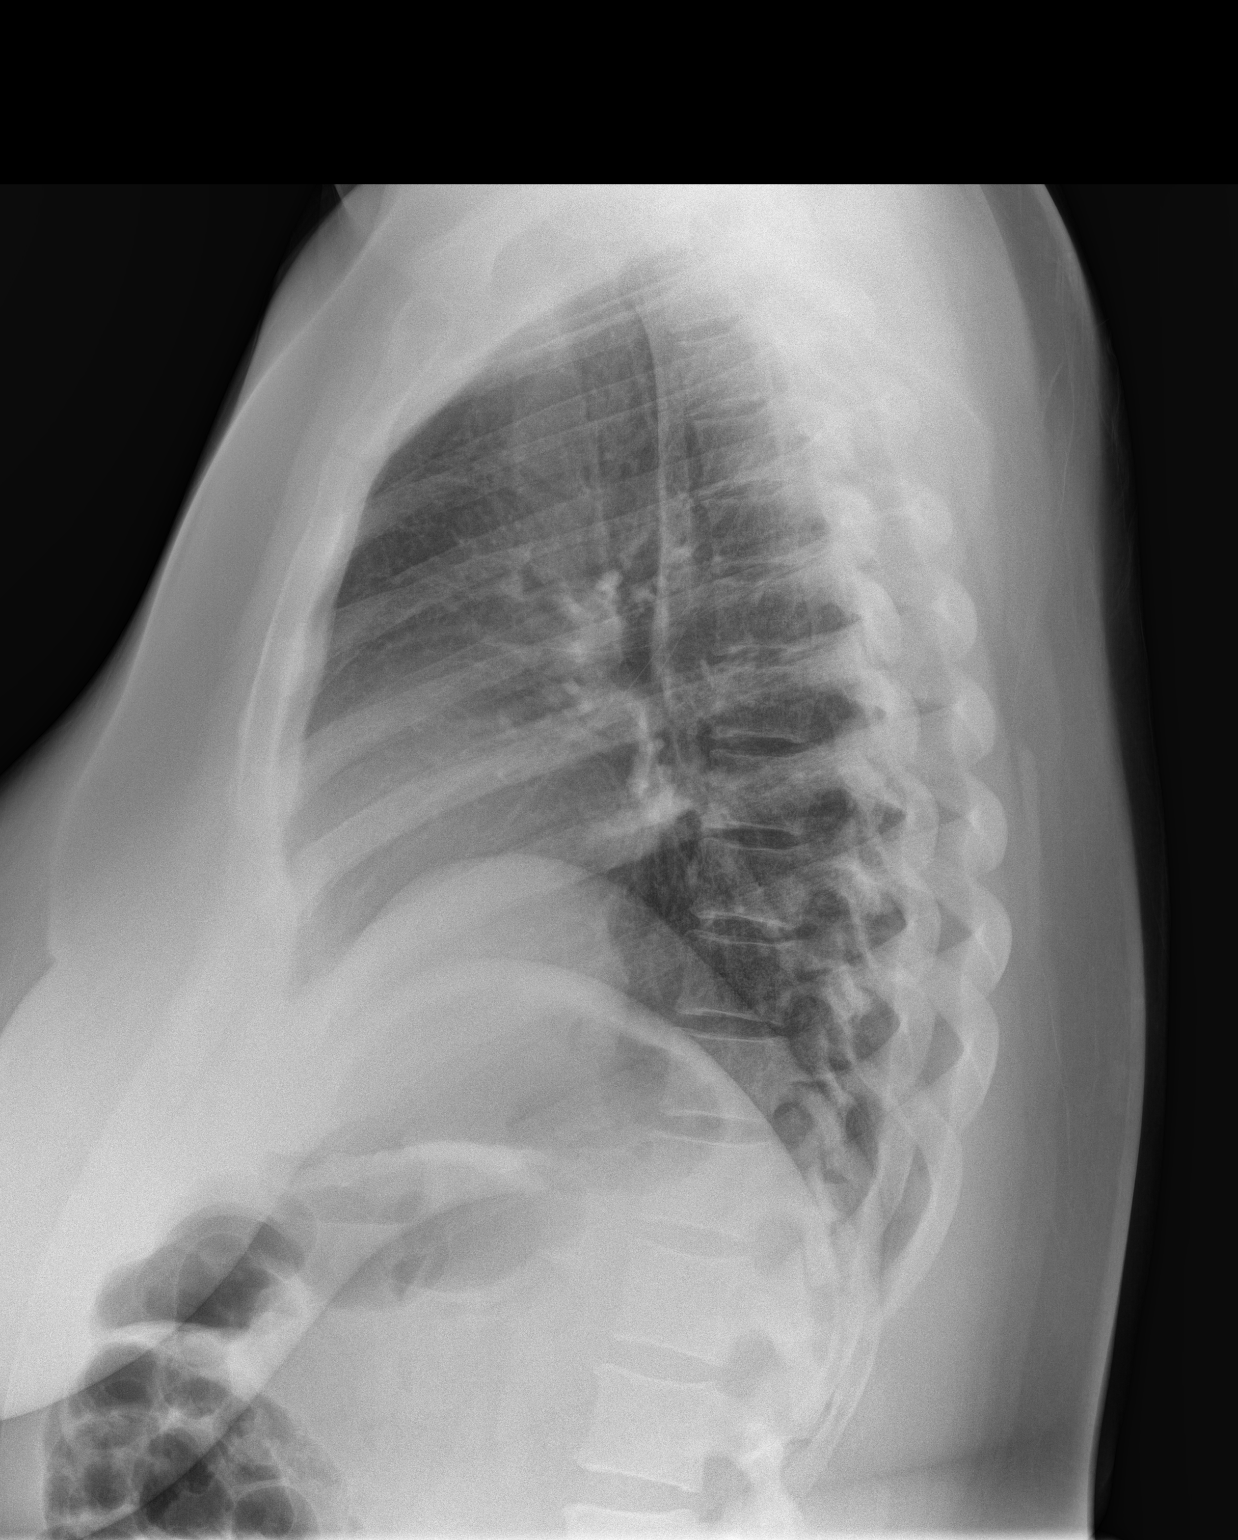

[2 of 2 positions shown; findings below may reference images not displayed]

FINDINGS: The heart size is exaggerated by low lung volumes. Is no edema or
effusion. No focal airspace disease is evident. The visualized soft
tissues and bony thorax are unremarkable.
IMPRESSION: 1. Low lung volumes.
2. No acute cardiopulmonary disease.

## 2017-07-07 ENCOUNTER — Emergency Department
Admission: EM | Admit: 2017-07-07 | Discharge: 2017-07-07 | Disposition: A | Discharge status: Home / Self Care | Payer: Self-pay | Attending: Emergency Medicine | Admitting: Emergency Medicine

## 2017-07-07 ENCOUNTER — Encounter: Payer: Self-pay | Admitting: Emergency Medicine

## 2017-07-07 DIAGNOSIS — Z7984 Long term (current) use of oral hypoglycemic drugs: Secondary | ICD-10-CM | POA: Insufficient documentation

## 2017-07-07 DIAGNOSIS — I10 Essential (primary) hypertension: Secondary | ICD-10-CM | POA: Insufficient documentation

## 2017-07-07 DIAGNOSIS — G8929 Other chronic pain: Secondary | ICD-10-CM | POA: Insufficient documentation

## 2017-07-07 DIAGNOSIS — Z79899 Other long term (current) drug therapy: Secondary | ICD-10-CM | POA: Insufficient documentation

## 2017-07-07 DIAGNOSIS — F17210 Nicotine dependence, cigarettes, uncomplicated: Secondary | ICD-10-CM | POA: Insufficient documentation

## 2017-07-07 DIAGNOSIS — E119 Type 2 diabetes mellitus without complications: Secondary | ICD-10-CM | POA: Insufficient documentation

## 2017-07-07 DIAGNOSIS — M5441 Lumbago with sciatica, right side: Secondary | ICD-10-CM | POA: Insufficient documentation

## 2017-07-07 MED ORDER — DICLOFENAC SODIUM 75 MG PO TBEC: 75.0000 mg | DELAYED_RELEASE_TABLET | Freq: Two times a day (BID) | ORAL | 1 refills | Status: DC

## 2017-07-07 MED ORDER — MELOXICAM 7.5 MG PO TABS
7.5000 mg | ORAL_TABLET | Freq: Once | ORAL | Status: AC
  Administered 2017-07-07: 7.5 mg via ORAL

## 2017-07-07 MED ORDER — DICLOFENAC SODIUM 75 MG PO TBEC
75.0000 mg | DELAYED_RELEASE_TABLET | Freq: Once | ORAL | Status: DC
  Filled 2017-07-07: qty 1

## 2017-07-07 MED ORDER — CYCLOBENZAPRINE HCL 5 MG PO TABS: 5.0000 mg | ORAL_TABLET | Freq: Three times a day (TID) | ORAL | 0 refills | Status: DC | PRN

## 2017-07-07 MED ORDER — CYCLOBENZAPRINE HCL 10 MG PO TABS
10.0000 mg | ORAL_TABLET | Freq: Once | ORAL | Status: AC
  Administered 2017-07-07: 10 mg via ORAL
  Filled 2017-07-07: qty 1

## 2017-07-07 MED ORDER — MELOXICAM 7.5 MG PO TABS
ORAL_TABLET | ORAL | Status: AC
  Filled 2017-07-07: qty 1

## 2017-07-07 MED ORDER — GABAPENTIN 100 MG PO CAPS: 100.0000 mg | ORAL_CAPSULE | Freq: Three times a day (TID) | ORAL | 1 refills | Status: DC

## 2017-07-07 NOTE — ED Provider Notes (Signed)
[facility] Emergency Department Provider Note ____________________________________________  Time seen: 1745  I have reviewed the triage vital signs and the nursing notes.  HISTORY  Chief Complaint  Back Pain   HPI Brenda Simmons is a 41 y.o. female presents to the ED for evaluation of chronic low back pain and sciatica. The patient denies being in any current pain management clinic for management of her symptoms. She also denies any particular preference medications by her PCP at [facility] clinic. She denies any recent injury, accident, trauma, or fall. She reports pain is just slightly above baseline. She typically takes ibuprofen and Aleve at home for pain relief. She was reported that over the last 3 weeks she's had a slight bump in her pain overall, and in the last 2-3 days symptoms have much her to return to the ED for treatment. She denies any new, worrisome, or change in symptoms.   Past Medical History:  Diagnosis Date  . Anxiety   . Blood transfusion without reported diagnosis   . Breast mass x 3 mos   felt by MD, pt does not feel it  . Bulging lumbar disc    Seen [facility] Clinic at [facility]  . Depression    Seeing Mental Health Professional  . Diabetes mellitus without complication ([place])   . Hypertension   . Migraines   . Partial tear of rotator cuff    Injections did not help  . Pre-diabetes    On Metformin.    Patient Active Problem List   Diagnosis Date Noted  . Right-sided low back pain with right-sided sciatica 12/18/2015  . Migraine headache 12/04/2015  . Migrainous dizziness 12/04/2015  . Viral URI 09/11/2015  . Breast mass, right 09/11/2015  . Nonspecific abnormal electrocardiogram (ECG) (EKG) 09/08/2015  . Vitamin D deficiency 08/18/2015  . Pap smear of cervix unsatisfactory 08/18/2015  . Annual physical exam 07/30/2015  . Chronic neck and back pain 07/30/2015  . Chronic right shoulder pain 07/30/2015  . Elevated platelet count  07/16/2015  . Persistent dry cough 07/14/2015  . Pre-diabetes 07/14/2015  . Disorder of rotator cuff 04/25/2014    Past Surgical History:  Procedure Laterality Date  . ANKLE SURGERY Left   . BREAST CYST ASPIRATION Right age 24  . OTHER SURGICAL HISTORY Left ankle surgery  . SALPINGECTOMY Bilateral     Prior to Admission medications   Medication Sig Start Date End Date Taking? Authorizing Provider  clonazePAM (KLONOPIN) 0.5 MG tablet Take 0.5 mg by mouth daily.    [provider]  cyclobenzaprine (FLEXERIL) 5 MG tablet Take 1 tablet (5 mg total) by mouth 3 (three) times daily as needed for muscle spasms. 07/07/17   , Dannielle Karvonen, PA-C  diclofenac (VOLTAREN) 75 MG EC tablet Take 1 tablet (75 mg total) by mouth 2 (two) times daily. 07/07/17   , Dannielle Karvonen, PA-C  gabapentin (NEURONTIN) 100 MG capsule Take 1 capsule (100 mg total) by mouth 3 (three) times daily. Start with the bedtime dose. Increase in two weeks to 2 tabs qhs x 2 weeks. Then add 2 tabs midday x 2 weeks. Then add 2 tabs in the morning. 07/07/17   , Dannielle Karvonen, PA-C  metFORMIN (GLUCOPHAGE) 500 MG tablet Take 500 mg by mouth 2 (two) times daily. 12/05/12   [provider]  propranolol (INDERAL) 40 MG tablet Take 40 mg by mouth 2 (two) times daily. 12/05/12   [provider]  Vitamin D, Ergocalciferol, (DRISDOL)  50000 units CAPS capsule Take 1 capsule (50,000 Units total) by mouth once a week. For 12 weeks 12/15/15   Roselee Nova, MD    Allergies Patient has no known allergies.  Family History  Problem Relation Age of Onset  . Depression Mother   . Hypertension Mother   . Diabetes Mother   . Hypertension Father   . Hypertension Sister   . Depression Brother   . Breast cancer Neg Hx     Social History Social History  Substance Use Topics  . Smoking status: Current Every Day Smoker    Packs/day: 0.50    Years: 15.00    Types: Cigarettes  . Smokeless  tobacco: Never Used  . Alcohol use Yes     Comment: Occasionally during Holidays    Review of Systems  Constitutional: Negative for fever. Cardiovascular: Negative for chest pain. Respiratory: Negative for shortness of breath. Gastrointestinal: Negative for abdominal pain, vomiting and diarrhea. Genitourinary: Negative for dysuria. Musculoskeletal: positive for back pain. Skin: Negative for rash. Neurological: Negative for headaches, focal weakness or numbness. ____________________________________________  PHYSICAL EXAM:  VITAL SIGNS: ED Triage Vitals  Enc Vitals Group     BP 07/07/17 1631 (!) 143/84     Pulse Rate 07/07/17 1629 77     Resp 07/07/17 1629 18     Temp 07/07/17 1629 98.4 F (36.9 C)     Temp Source 07/07/17 1629 Oral     SpO2 07/07/17 1629 99 %     Weight 07/07/17 1630 225 lb (102.1 kg)     Height 07/07/17 1630 5\' 4"  (1.626 m)     Head Circumference --      Peak Flow --      Pain Score 07/07/17 1629 10     Pain Loc --      Pain Edu? --      Excl. in [place]? --     Constitutional: Alert and oriented. Well appearing and in no distress. Head: Normocephalic and atraumatic. Eyes: Conjunctivae are normal. Normal extraocular movements Neck: Supple. No thyromegaly. Cardiovascular: Normal rate, regular rhythm. Normal distal pulses. Respiratory: Normal respiratory effort. No wheezes/rales/rhonchi. Gastrointestinal: Soft and nontender. No distention. No CVA tenderness Musculoskeletal: normal spinal alignment without midline tenderness, spasm, deformity, or step-off. Patient with normal transition from sit to stand. She is able to demonstrate normal toe raise and heel raise on exam. She is evidently normal lumbar flexion and extension range. Nontender with normal range of motion in all extremities.  Neurologic:  CN II-XII grossly intact. LE DTRs 2+ and symmetric bilaterally. Normal gait without ataxia. Normal speech and language. No gross focal neurologic deficits are  appreciated. Skin:  Skin is warm, dry and intact. No rash noted. ____________________________________________  PROCEDURES  Flexeril 10 mg IM Mobic 7.5 mg PO ____________________________________________  INITIAL IMPRESSION / ASSESSMENT AND PLAN / ED COURSE  Patient with ED evaluation of chronic low back pain and radicular symptoms. No acute neurological findings on exam. She is discharged at this time with prescriptions for Flexeril, ) neck, and gabapentin. She is given instructions on slowly titrating her gabapentin prescription up over the next 4 weeks. She will follow with primary care provider for ongoing symptom management. Return to the ED as needed. ____________________________________________  FINAL CLINICAL IMPRESSION(S) / ED DIAGNOSES  Final diagnoses:  Chronic right-sided low back pain with right-sided sciatica     Melvenia Needles, PA-C 07/08/17 1619    Delman Kitten, MD 07/09/17 [phone]

## 2017-07-07 NOTE — ED Triage Notes (Signed)
Pt has chronic back pain from bulging disc but has been worse 3 weeks.  Pain down right leg that is worst. Pain worse with movement. C/o muscle spasms.  Denies loss bowel or bladder.

## 2017-07-07 NOTE — Discharge Instructions (Signed)
Your exam is overall at baseline. You should take the prescription meds as directed. Follow-up with your provider for continuation of medicines. Return as needed.

## 2017-08-30 ENCOUNTER — Emergency Department
Admission: EM | Admit: 2017-08-30 | Discharge: 2017-08-30 | Disposition: A | Discharge status: Home / Self Care | Payer: Self-pay | Attending: Emergency Medicine | Admitting: Emergency Medicine

## 2017-08-30 ENCOUNTER — Encounter: Payer: Self-pay | Admitting: *Deleted

## 2017-08-30 ENCOUNTER — Emergency Department: Payer: Self-pay

## 2017-08-30 ENCOUNTER — Other Ambulatory Visit: Payer: Self-pay

## 2017-08-30 DIAGNOSIS — M62830 Muscle spasm of back: Secondary | ICD-10-CM | POA: Insufficient documentation

## 2017-08-30 DIAGNOSIS — J40 Bronchitis, not specified as acute or chronic: Secondary | ICD-10-CM | POA: Insufficient documentation

## 2017-08-30 DIAGNOSIS — T148XXA Other injury of unspecified body region, initial encounter: Secondary | ICD-10-CM

## 2017-08-30 DIAGNOSIS — E119 Type 2 diabetes mellitus without complications: Secondary | ICD-10-CM | POA: Insufficient documentation

## 2017-08-30 DIAGNOSIS — I10 Essential (primary) hypertension: Secondary | ICD-10-CM | POA: Insufficient documentation

## 2017-08-30 MED ORDER — PREDNISONE 10 MG PO TABS: ORAL_TABLET | ORAL | 0 refills | Status: DC

## 2017-08-30 MED ORDER — ALBUTEROL SULFATE HFA 108 (90 BASE) MCG/ACT IN AERS: 2.0000 | INHALATION_SPRAY | Freq: Four times a day (QID) | RESPIRATORY_TRACT | 0 refills | Status: DC | PRN

## 2017-08-30 MED ORDER — LIDOCAINE 5 % EX PTCH
1.0000 | MEDICATED_PATCH | CUTANEOUS | Status: DC
  Administered 2017-08-30: 1 via TRANSDERMAL
  Filled 2017-08-30: qty 1

## 2017-08-30 MED ORDER — LIDOCAINE 5 % EX PTCH: 1.0000 | MEDICATED_PATCH | Freq: Two times a day (BID) | CUTANEOUS | 0 refills | Status: DC

## 2017-08-30 MED ORDER — IPRATROPIUM-ALBUTEROL 0.5-2.5 (3) MG/3ML IN SOLN
3.0000 mL | Freq: Once | RESPIRATORY_TRACT | Status: AC
  Administered 2017-08-30: 3 mL via RESPIRATORY_TRACT
  Filled 2017-08-30: qty 3

## 2017-08-30 MED ORDER — CYCLOBENZAPRINE HCL 5 MG PO TABS: ORAL_TABLET | ORAL | 0 refills | Status: DC

## 2017-08-30 NOTE — ED Provider Notes (Signed)
[facility] Emergency Department Provider Note  ____________________________________________  Time seen: Approximately 5:07 PM  I have reviewed the triage vital signs and the nursing notes.   HISTORY  Chief Complaint Back Pain    HPI Brenda Simmons is a 41 y.o. female that presents to the emergency department for evaluation of back pain and nonproductive cough for 2 weeks.  Patient states that cough is improving but has not resolved.  Back pain is in the middle on the right side.  It is worse with movement.  It hurts when she touches it.  She has had some chills in the night.  She is concerned for pneumonia since it is "going around." She smokes a pack of cigarettes every 3 days.  No history of allergies or asthma.  No trauma to back.  She has a history of sciatica.  She has been taking ibuprofen, goody powders, and NyQuil for symptoms.  No shortness of breath, chest pain, nausea, vomiting, abdominal pain.  Past Medical History:  Diagnosis Date  . Anxiety   . Breast mass x 3 mos   felt by MD, pt does not feel it  . Bulging lumbar disc    Seen Pain Clinic at [facility]  . Depression    Seeing Mental Health Professional  . Diabetes mellitus without complication ([place])   . Hypertension   . Migraines   . Partial tear of rotator cuff    Injections did not help  . Pre-diabetes    On Metformin.    Patient Active Problem List   Diagnosis Date Noted  . Right-sided low back pain with right-sided sciatica 12/18/2015  . Migraine headache 12/04/2015  . Migrainous dizziness 12/04/2015  . Viral URI 09/11/2015  . Breast mass, right 09/11/2015  . Nonspecific abnormal electrocardiogram (ECG) (EKG) 09/08/2015  . Vitamin D deficiency 08/18/2015  . Pap smear of cervix unsatisfactory 08/18/2015  . Annual physical exam 07/30/2015  . Chronic neck and back pain 07/30/2015  . Chronic right shoulder pain 07/30/2015  . Elevated platelet count 07/16/2015  . Persistent dry  cough 07/14/2015  . Pre-diabetes 07/14/2015  . Disorder of rotator cuff 04/25/2014    Past Surgical History:  Procedure Laterality Date  . ANKLE SURGERY Left   . BREAST CYST ASPIRATION Right age 52  . OTHER SURGICAL HISTORY Left ankle surgery  . SALPINGECTOMY Bilateral     Prior to Admission medications   Medication Sig Start Date End Date Taking? Authorizing Provider  albuterol (PROVENTIL HFA;VENTOLIN HFA) 108 (90 Base) MCG/ACT inhaler Inhale 2 puffs into the lungs every 6 (six) hours as needed for wheezing or shortness of breath. 08/30/17   Laban Emperor, PA-C  clonazePAM (KLONOPIN) 0.5 MG tablet Take 0.5 mg by mouth daily.    [provider]  cyclobenzaprine (FLEXERIL) 5 MG tablet Take 1-2 tablets 3 times daily as needed 08/30/17   Laban Emperor, PA-C  diclofenac (VOLTAREN) 75 MG EC tablet Take 1 tablet (75 mg total) by mouth 2 (two) times daily. 07/07/17   Menshew, Dannielle Karvonen, PA-C  gabapentin (NEURONTIN) 100 MG capsule Take 1 capsule (100 mg total) by mouth 3 (three) times daily. Start with the bedtime dose. Increase in two weeks to 2 tabs qhs x 2 weeks. Then add 2 tabs midday x 2 weeks. Then add 2 tabs in the morning. 07/07/17   Menshew, Dannielle Karvonen, PA-C  lidocaine (LIDODERM) 5 % Place 1 patch onto the skin every 12 (twelve) hours. Remove & Discard patch  within 12 hours or as directed by MD 08/30/17 08/30/18  Laban Emperor, PA-C  metFORMIN (GLUCOPHAGE) 500 MG tablet Take 500 mg by mouth 2 (two) times daily. 12/05/12   [provider]  predniSONE (DELTASONE) 10 MG tablet Take 6 tablets on day 1, take 5 tablets on day 2, take 4 tablets on day 3, take 3 tablets on day 4, take 2 tablets on day 5, take 1 tablet on day 6 08/30/17   Laban Emperor, PA-C  propranolol (INDERAL) 40 MG tablet Take 40 mg by mouth 2 (two) times daily. 12/05/12   [provider]  Vitamin D, Ergocalciferol, (DRISDOL) 50000 units CAPS capsule Take 1 capsule (50,000 Units total) by mouth  once a week. For 12 weeks 12/15/15   Roselee Nova, MD    Allergies Patient has no known allergies.  Family History  Problem Relation Age of Onset  . Depression Mother   . Hypertension Mother   . Diabetes Mother   . Hypertension Father   . Hypertension Sister   . Depression Brother   . Breast cancer Neg Hx     Social History Social History   Tobacco Use  . Smoking status: Current Every Day Smoker    Packs/day: 0.50    Years: 15.00    Pack years: 7.50    Types: Cigarettes  . Smokeless tobacco: Never Used  Substance Use Topics  . Alcohol use: Yes    Comment: Occasionally during Holidays  . Drug use: No     Review of Systems  Constitutional: No fever/chills Eyes: No visual changes. No discharge. ENT: Negative for congestion and rhinorrhea. Cardiovascular: No chest pain. Respiratory: Positive for cough. No SOB. Gastrointestinal: No abdominal pain.  No nausea, no vomiting.  No diarrhea.  No constipation. Musculoskeletal: Positive for back pain Skin: Negative for rash, abrasions, lacerations, ecchymosis. Neurological: Negative for headaches.   ____________________________________________   PHYSICAL EXAM:  VITAL SIGNS: ED Triage Vitals  Enc Vitals Group     BP 08/30/17 1606 129/73     Pulse Rate 08/30/17 1606 73     Resp 08/30/17 1606 18     Temp 08/30/17 1606 98.4 F (36.9 C)     Temp Source 08/30/17 1606 Oral     SpO2 08/30/17 1606 99 %     Weight 08/30/17 1607 230 lb (104.3 kg)     Height 08/30/17 1607 5\' 4"  (1.626 m)     Head Circumference --      Peak Flow --      Pain Score 08/30/17 1610 9     Pain Loc --      Pain Edu? --      Excl. in [place]? --      Constitutional: Alert and oriented. Well appearing and in no acute distress. Eyes: Conjunctivae are normal. PERRL. EOMI. No discharge. Head: Atraumatic. ENT: No frontal and maxillary sinus tenderness.      Ears: Tympanic membranes pearly gray with good landmarks. No discharge.      Nose: No  congestion/rhinnorhea.      Mouth/Throat: Mucous membranes are moist. Oropharynx non-erythematous. Tonsils not enlarged. No exudates. Uvula midline. Neck: No stridor.   Hematological/Lymphatic/Immunilogical: No cervical lymphadenopathy. Cardiovascular: Normal rate, regular rhythm.  Good peripheral circulation. Respiratory: Normal respiratory effort without tachypnea or retractions. Lungs CTAB. Good air entry to the bases with no decreased or absent breath sounds. Gastrointestinal: Bowel sounds 4 quadrants. Soft and nontender to palpation. No guarding or rigidity. No palpable masses. No distention.  Musculoskeletal: Full range of motion to all extremities. No gross deformities appreciated.  Tenderness to palpation over right thoracic paraspinal muscles. Neurologic:  Normal speech and language. No gross focal neurologic deficits are appreciated.  Skin:  Skin is warm, dry and intact. No rash noted.   ____________________________________________   LABS (all labs ordered are listed, but only abnormal results are displayed)  Labs Reviewed - No data to display ____________________________________________  EKG   ____________________________________________  RADIOLOGY Robinette Haines, personally viewed and evaluated these images (plain radiographs) as part of my medical decision making, as well as reviewing the written report by the radiologist.  Dg Chest 2 View  Result Date: 08/30/2017 CLINICAL DATA:  Right-sided back pain with cough EXAM: CHEST  2 VIEW COMPARISON:  07/28/2016 FINDINGS: Cardiac shadow is within normal limits. Mild peribronchial changes are noted which may represent some mild bronchitis. No focal confluent infiltrate is seen. No effusion is noted. No bony abnormality is seen. IMPRESSION: Mild peribronchial changes as described. Electronically Signed   By: Inez Catalina M.D.   On: 08/30/2017 16:56     ____________________________________________    PROCEDURES  Procedure(s) performed:    Procedures    Medications  lidocaine (LIDODERM) 5 % 1 patch (1 patch Transdermal Patch Applied 08/30/17 1756)  ipratropium-albuterol (DUONEB) 0.5-2.5 (3) MG/3ML nebulizer solution 3 mL (3 mLs Nebulization Given 08/30/17 1756)     ____________________________________________   INITIAL IMPRESSION / ASSESSMENT AND PLAN / ED COURSE  Pertinent labs & imaging results that were available during my care of the patient were reviewed by me and considered in my medical decision making (see chart for details).  Review of the [place] CSRS was performed in accordance of the [place] prior to dispensing any controlled drugs.   Patient's diagnosis is consistent with bronchitis and muscle strain. Vital signs and exam are reassuring.  Chest x-ray consistent with bronchitis.  Patient appears well and is staying well hydrated. Patient feels comfortable going home. Patient will be discharged home with prescriptions for prednisone, Flexeril, albuterol, lidoderm. Patient is to follow up with PCP as needed or otherwise directed. Patient is given ED precautions to return to the ED for any worsening or new symptoms.  ____________________________________________  FINAL CLINICAL IMPRESSION(S) / ED DIAGNOSES  Final diagnoses:  Bronchitis  Muscle strain      NEW MEDICATIONS STARTED DURING THIS VISIT:  ED Discharge Orders        Ordered    predniSONE (DELTASONE) 10 MG tablet     08/30/17 1750    cyclobenzaprine (FLEXERIL) 5 MG tablet     08/30/17 1750    lidocaine (LIDODERM) 5 %  Every 12 hours     08/30/17 1750    albuterol (PROVENTIL HFA;VENTOLIN HFA) 108 (90 Base) MCG/ACT inhaler  Every 6 hours PRN     08/30/17 1750          This chart was dictated using voice recognition software/Dragon. Despite best efforts to proofread, errors can occur which can change the meaning. Any change was purely  unintentional.    Laban Emperor, PA-C 08/30/17 1903    Clearnce Hasten Randall An, MD 08/30/17 [phone]

## 2017-08-30 NOTE — ED Notes (Signed)
See triage note  States she developed pain to left mid back for the past 2 weeks   Pain increases with movement and cough  Denies any fever or n/v  Area to back tender to touch

## 2017-08-30 NOTE — ED Triage Notes (Signed)
Pt to ED reporting pain in middle right side of back that worsens with movement, coughing and deep breathing. Pt reports the pain has been for 2 weeks now without known injury. Pt reports she has been coughing for the past 2 weeks but cough has been nonproductive in nature. No known fevers reported. No chest pain ro SOB reported.

## 2017-12-20 ENCOUNTER — Emergency Department
Admission: EM | Admit: 2017-12-20 | Discharge: 2017-12-20 | Disposition: A | Discharge status: Home / Self Care | Payer: Medicaid Other | Attending: Emergency Medicine | Admitting: Emergency Medicine

## 2017-12-20 ENCOUNTER — Emergency Department: Payer: Medicaid Other

## 2017-12-20 ENCOUNTER — Encounter: Payer: Self-pay | Admitting: Emergency Medicine

## 2017-12-20 ENCOUNTER — Other Ambulatory Visit: Payer: Self-pay

## 2017-12-20 DIAGNOSIS — F419 Anxiety disorder, unspecified: Secondary | ICD-10-CM | POA: Insufficient documentation

## 2017-12-20 DIAGNOSIS — Z7984 Long term (current) use of oral hypoglycemic drugs: Secondary | ICD-10-CM | POA: Insufficient documentation

## 2017-12-20 DIAGNOSIS — Z79899 Other long term (current) drug therapy: Secondary | ICD-10-CM | POA: Insufficient documentation

## 2017-12-20 DIAGNOSIS — J209 Acute bronchitis, unspecified: Secondary | ICD-10-CM

## 2017-12-20 DIAGNOSIS — I10 Essential (primary) hypertension: Secondary | ICD-10-CM | POA: Insufficient documentation

## 2017-12-20 DIAGNOSIS — E119 Type 2 diabetes mellitus without complications: Secondary | ICD-10-CM | POA: Insufficient documentation

## 2017-12-20 DIAGNOSIS — F329 Major depressive disorder, single episode, unspecified: Secondary | ICD-10-CM | POA: Insufficient documentation

## 2017-12-20 DIAGNOSIS — F17210 Nicotine dependence, cigarettes, uncomplicated: Secondary | ICD-10-CM | POA: Insufficient documentation

## 2017-12-20 MED ORDER — BENZONATATE 200 MG PO CAPS: 200.0000 mg | ORAL_CAPSULE | Freq: Three times a day (TID) | ORAL | 0 refills | Status: DC | PRN

## 2017-12-20 MED ORDER — PREDNISONE 10 MG (21) PO TBPK: ORAL_TABLET | ORAL | 0 refills | Status: DC

## 2017-12-20 MED ORDER — AZITHROMYCIN 250 MG PO TABS: ORAL_TABLET | ORAL | 0 refills | Status: DC

## 2017-12-20 MED ORDER — IPRATROPIUM-ALBUTEROL 0.5-2.5 (3) MG/3ML IN SOLN
3.0000 mL | Freq: Once | RESPIRATORY_TRACT | Status: AC
  Administered 2017-12-20: 3 mL via RESPIRATORY_TRACT
  Filled 2017-12-20: qty 3

## 2017-12-20 NOTE — ED Provider Notes (Signed)
[facility] Emergency Department Provider Note  ____________________________________________   First MD Initiated Contact with Patient 12/20/17 1525     (approximate)  I have reviewed the triage vital signs and the nursing notes.   HISTORY  Chief Complaint Cough and Nasal Congestion    HPI Brenda Simmons is a 42 y.o. female resents emergency department complaining of intermittent cough and fever for 2-1/2 weeks.  She states that she got a little better and then symptoms all returned.  She states the nausea and vomiting was just yesterday.  She has had loose stools for several days.  She denies abdominal pain.  She thinks some of the loose stools are from all of the cold medicines that she is been taking.  States she has history of IBS.  She states she has had a low-grade fever.  She states other family members have the same.  Smoker, 1/4 pack/day  Past Medical History:  Diagnosis Date  . Anxiety   . Breast mass x 3 mos   felt by MD, pt does not feel it  . Bulging lumbar disc    Seen Pain Clinic at [facility]  . Depression    Seeing Mental Health Professional  . Diabetes mellitus without complication ([place])   . Hypertension   . Migraines   . Partial tear of rotator cuff    Injections did not help  . Pre-diabetes    On Metformin.    Patient Active Problem List   Diagnosis Date Noted  . Right-sided low back pain with right-sided sciatica 12/18/2015  . Migraine headache 12/04/2015  . Migrainous dizziness 12/04/2015  . Viral URI 09/11/2015  . Breast mass, right 09/11/2015  . Nonspecific abnormal electrocardiogram (ECG) (EKG) 09/08/2015  . Vitamin D deficiency 08/18/2015  . Pap smear of cervix unsatisfactory 08/18/2015  . Annual physical exam 07/30/2015  . Chronic neck and back pain 07/30/2015  . Chronic right shoulder pain 07/30/2015  . Elevated platelet count 07/16/2015  . Persistent dry cough 07/14/2015  . Pre-diabetes 07/14/2015  . Disorder  of rotator cuff 04/25/2014    Past Surgical History:  Procedure Laterality Date  . ANKLE SURGERY Left   . BREAST CYST ASPIRATION Right age 31  . OTHER SURGICAL HISTORY Left ankle surgery  . SALPINGECTOMY Bilateral     Prior to Admission medications   Medication Sig Start Date End Date Taking? Authorizing Provider  albuterol (PROVENTIL HFA;VENTOLIN HFA) 108 (90 Base) MCG/ACT inhaler Inhale 2 puffs into the lungs every 6 (six) hours as needed for wheezing or shortness of breath. 08/30/17   Laban Emperor, PA-C  azithromycin (ZITHROMAX Z-PAK) 250 MG tablet 2 pills today then 1 pill a day for 4 days 12/20/17   Caryn Section Linden Dolin, PA-C  benzonatate (TESSALON) 200 MG capsule Take 1 capsule (200 mg total) by mouth 3 (three) times daily as needed for cough. 12/20/17   Fisher, Linden Dolin, PA-C  clonazePAM (KLONOPIN) 0.5 MG tablet Take 0.5 mg by mouth daily.    [provider]  metFORMIN (GLUCOPHAGE) 500 MG tablet Take 500 mg by mouth 2 (two) times daily. 12/05/12   [provider]  predniSONE (STERAPRED UNI-PAK 21 TAB) 10 MG (21) TBPK tablet Take 6 pills on day one then decrease by 1 pill each day 12/20/17   Versie Starks, PA-C  propranolol (INDERAL) 40 MG tablet Take 40 mg by mouth 2 (two) times daily. 12/05/12   [provider]  Vitamin D, Ergocalciferol, (DRISDOL) 50000 units CAPS  capsule Take 1 capsule (50,000 Units total) by mouth once a week. For 12 weeks 12/15/15   Roselee Nova, MD    Allergies Patient has no known allergies.  Family History  Problem Relation Age of Onset  . Depression Mother   . Hypertension Mother   . Diabetes Mother   . Hypertension Father   . Hypertension Sister   . Depression Brother   . Breast cancer Neg Hx     Social History Social History   Tobacco Use  . Smoking status: Current Every Day Smoker    Packs/day: 0.50    Years: 15.00    Pack years: 7.50    Types: Cigarettes  . Smokeless tobacco: Never Used  Substance Use Topics    . Alcohol use: Yes    Comment: Occasionally during Holidays  . Drug use: No    Review of Systems  Constitutional: Positive fever/chills Eyes: No visual changes. ENT: No sore throat. Respiratory: Positive cough Gastrointestinal: Positive for nausea, vomiting, diarrhea yesterday; none today Genitourinary: Negative for dysuria. Musculoskeletal: Negative for back pain. Skin: Negative for rash.    ____________________________________________   PHYSICAL EXAM:  VITAL SIGNS: ED Triage Vitals  Enc Vitals Group     BP 12/20/17 1511 124/84     Pulse Rate 12/20/17 1511 69     Resp 12/20/17 1511 18     Temp 12/20/17 1511 98.4 F (36.9 C)     Temp Source 12/20/17 1511 Oral     SpO2 12/20/17 1511 97 %     Weight 12/20/17 1512 230 lb (104.3 kg)     Height --      Head Circumference --      Peak Flow --      Pain Score 12/20/17 1512 7     Pain Loc --      Pain Edu? --      Excl. in [place]? --     Constitutional: Alert and oriented. Well appearing and in no acute distress. Eyes: Conjunctivae are normal.  Head: Atraumatic. Nose: No congestion/rhinnorhea. Mouth/Throat: Mucous membranes are moist.  Throat appears normal heart sounds are normal. Cardiovascular: Normal rate, regular rhythm. Respiratory: Normal respiratory effort.  No retractions, lungs clear to auscultation, cough is dry , hacking GU: deferred Musculoskeletal: FROM all extremities, warm and well perfused Neurologic:  Normal speech and language.  Skin:  Skin is warm, dry and intact. No rash noted. Psychiatric: Mood and affect are normal. Speech and behavior are normal.  ____________________________________________   LABS (all labs ordered are listed, but only abnormal results are displayed)  Labs Reviewed - No data to display ____________________________________________   ____________________________________________  RADIOLOGY  Chest x-ray negative for  pneumonia  ____________________________________________   PROCEDURES  Procedure(s) performed: No  Procedures    ____________________________________________   INITIAL IMPRESSION / ASSESSMENT AND PLAN / ED COURSE  Pertinent labs & imaging results that were available during my care of the patient were reviewed by me and considered in my medical decision making (see chart for details).  Patient is 42 year old female presents emergency department complaining of flulike symptoms for 2 weeks.  On physical exam her lungs are clear and heart sounds are normal but the cough is very dry and hacking  Chest x-ray is negative for pneumonia is negative for pneumonia  DuoNeb given to patient.  Increased breath sounds after DuoNeb Chest x-ray results were discussed with patient.  She was diagnosed with acute bronchitis.  She is given a prescription for Z-Pak,  steroid pack, and Tessalon Perles.  She is to follow-up with her regular doctor in the acute care if not better in 3 days. If she is worsening she should return to the emergency department.  She states she understands and was discharged in stable condition.     As part of my medical decision making, I reviewed the following data within the [place] notes reviewed and incorporated, Radiograph reviewed chest x-ray is negative for pneumonia, Notes from prior ED visits and [place] Controlled Substance Database  ____________________________________________   FINAL CLINICAL IMPRESSION(S) / ED DIAGNOSES  Final diagnoses:  Acute bronchitis, unspecified organism      NEW MEDICATIONS STARTED DURING THIS VISIT:  New Prescriptions   AZITHROMYCIN (ZITHROMAX Z-PAK) 250 MG TABLET    2 pills today then 1 pill a day for 4 days   BENZONATATE (TESSALON) 200 MG CAPSULE    Take 1 capsule (200 mg total) by mouth 3 (three) times daily as needed for cough.   PREDNISONE (STERAPRED UNI-PAK 21 TAB) 10 MG (21) TBPK TABLET    Take 6  pills on day one then decrease by 1 pill each day     Note:  This document was prepared using Dragon voice recognition software and may include unintentional dictation errors.    Versie Starks, PA-C 12/20/17 1638    Nance Pear, MD 12/20/17 [phone]

## 2017-12-20 NOTE — ED Triage Notes (Signed)
Pt to ed with c/o cough, congestion, sore throat x 2 1/2 weeks, denies fever.

## 2017-12-20 NOTE — ED Notes (Signed)
See triage note  Presents she developed some cough intermittent fever with n/v/d  Last time vomited was yesterday  Last diarrhea was this am

## 2018-06-23 ENCOUNTER — Other Ambulatory Visit: Payer: Self-pay | Admitting: Physician Assistant

## 2018-06-23 DIAGNOSIS — R519 Headache, unspecified: Secondary | ICD-10-CM

## 2018-06-23 DIAGNOSIS — R51 Headache: Principal | ICD-10-CM

## 2018-07-07 ENCOUNTER — Ambulatory Visit
Admission: RE | Admit: 2018-07-07 | Discharge: 2018-07-07 | Disposition: A | Discharge status: Home / Self Care | Payer: Medicaid Other | Source: Ambulatory Visit | Attending: Physician Assistant | Admitting: Physician Assistant

## 2018-07-07 DIAGNOSIS — R519 Headache, unspecified: Secondary | ICD-10-CM

## 2018-07-07 DIAGNOSIS — R51 Headache: Secondary | ICD-10-CM | POA: Insufficient documentation

## 2018-08-07 ENCOUNTER — Ambulatory Visit: Payer: Medicaid Other

## 2018-08-11 ENCOUNTER — Encounter (INDEPENDENT_AMBULATORY_CARE_PROVIDER_SITE_OTHER): Payer: Self-pay

## 2018-08-11 ENCOUNTER — Ambulatory Visit: Payer: Medicaid Other | Admitting: Pharmacy Technician

## 2018-08-11 DIAGNOSIS — Z79899 Other long term (current) drug therapy: Secondary | ICD-10-CM

## 2018-08-11 NOTE — Progress Notes (Signed)
Completed [facility] application and contract.  Patient agreed to all terms of the [facility] contract.    Patient approved to receive medication assistance at [facility] as long as eligibility criteria continues to be met.    Provided patient with community resource material based on her particular needs.    [facility]

## 2018-08-15 ENCOUNTER — Telehealth: Payer: Self-pay | Admitting: Adult Health Nurse Practitioner

## 2018-08-15 NOTE — Telephone Encounter (Signed)
Called patient to set up an appointment at [facility]. She did not answer and we did not leave a message since her eligibility information is outdated.

## 2018-08-30 ENCOUNTER — Telehealth: Payer: Self-pay

## 2018-08-30 NOTE — Telephone Encounter (Signed)
Called pt to go over eligibility. No answer. Left msg.

## 2018-08-30 NOTE — Telephone Encounter (Signed)
-----   Message from Reine Just sent at 08/15/2018  6:54 PM EST ----- Called patient to set up an appointment at [facility]. She did not answer and we did not leave a message since her eligibility/HIPAA information is outdated. Will need to call back another time.  ----- Message ----- From: Lurena Nida D Sent: 08/11/2018  10:51 AM EST To: Forest Becker    ----- Message ----- From: Jacquelynn Cree Sent: 08/11/2018   9:57 AM EST To: Ula Lingo, CMA  Patient is currently seeing a provider at [facility].  Having trouble with coming-up with the money for the provider.  Would like to be seen at [facility].  Thank you.

## 2018-10-12 ENCOUNTER — Encounter (INDEPENDENT_AMBULATORY_CARE_PROVIDER_SITE_OTHER): Payer: Self-pay

## 2018-10-12 ENCOUNTER — Encounter: Payer: Self-pay | Admitting: Pharmacist

## 2018-10-12 ENCOUNTER — Ambulatory Visit: Payer: Medicaid Other | Admitting: Pharmacist

## 2018-10-12 ENCOUNTER — Other Ambulatory Visit: Payer: Self-pay

## 2018-10-12 VITALS — BP 118/76 | Wt 243.0 lb

## 2018-10-12 DIAGNOSIS — Z79899 Other long term (current) drug therapy: Secondary | ICD-10-CM

## 2018-10-12 NOTE — Progress Notes (Signed)
[facility] Visit Note  Patient: Brenda Simmons MRN: [id number] Date of Birth: 15-Nov-1975 PCP: [facility]   ILO BEAMON 43 y.o. female presents for a medication management visit today.  BP 118/76 (BP Location: Right Arm, Patient Position: Sitting, Cuff Size: Large)   Wt 243 lb (110.2 kg)   BMI 41.71 kg/m   Patient Information   Past Medical History:  Diagnosis Date  . Anxiety   . Breast mass x 3 mos   felt by MD, pt does not feel it  . Bulging lumbar disc    Seen Pain Clinic at [facility]  . Depression    Seeing Mental Health Professional  . Diabetes mellitus without complication ([place])   . Hypertension   . Migraines   . Partial tear of rotator cuff    Injections did not help  . Pre-diabetes    On Metformin.      Past Surgical History:  Procedure Laterality Date  . ANKLE SURGERY Left   . BREAST CYST ASPIRATION Right age 52  . OTHER SURGICAL HISTORY Left ankle surgery  . SALPINGECTOMY Bilateral      Family History  Problem Relation Age of Onset  . Depression Mother   . Hypertension Mother   . Diabetes Mother   . Hypertension Father   . Hypertension Sister   . Depression Brother   . Cancer Maternal Grandmother   . Cancer Paternal Grandmother   . Breast cancer Neg Hx     Family Support: Good  Lifestyle Diet: Breakfast: normally skips Lunch: [employer], [place] (eats out mostly) Dinner: meat, vegetables (cooks at home) Drinks: regular soda, water, sweet tea    Current Exercise Habits: The patient does not participate in regular exercise at present       Social History   Substance and Sexual Activity  Alcohol Use Yes   Comment: Occasionally during Holidays      Social History   Tobacco Use  Smoking Status Current Every Day Smoker  . Packs/day: 0.50  . Years: 15.00  . Pack years: 7.50  . Types: Cigarettes  Smokeless Tobacco Never Used  Tobacco Comment   cutting back      Health  Maintenance  Topic Date Due  . HIV Screening  10/20/1990  . TETANUS/TDAP  10/20/1994  . INFLUENZA VACCINE  04/27/2018  . PAP SMEAR-Modifier  08/17/2018   Outpatient Encounter Medications as of 10/12/2018  Medication Sig  . albuterol (PROVENTIL HFA;VENTOLIN HFA) 108 (90 Base) MCG/ACT inhaler Inhale 2 puffs into the lungs every 6 (six) hours as needed for wheezing or shortness of breath.  . Aspirin-Salicylamide-Caffeine (BC HEADACHE POWDER PO) Take 1 packet by mouth as needed.  . cetirizine (ZYRTEC) 10 MG tablet Take 10 mg by mouth daily.  . DULoxetine (CYMBALTA) 60 MG capsule Take 60 mg by mouth daily.  Marland Kitchen ibuprofen (ADVIL,MOTRIN) 600 MG tablet Take 600 mg by mouth every 6 (six) hours as needed for cramping.  . metFORMIN (GLUCOPHAGE) 500 MG tablet Take 500 mg by mouth 2 (two) times daily.  . Multiple Vitamin (MULTIVITAMIN WITH MINERALS) TABS tablet Take 1 tablet by mouth daily.  . propranolol (INDERAL) 40 MG tablet Take 40 mg by mouth 2 (two) times daily.  . traZODone (DESYREL) 50 MG tablet Take 50 mg by mouth at bedtime.  . [DISCONTINUED] azithromycin (ZITHROMAX Z-PAK) 250 MG tablet 2 pills today then 1 pill a day for 4 days  . [DISCONTINUED] benzonatate (TESSALON) 200 MG capsule Take 1  capsule (200 mg total) by mouth 3 (three) times daily as needed for cough.  . [DISCONTINUED] clonazePAM (KLONOPIN) 0.5 MG tablet Take 0.5 mg by mouth daily.  . [DISCONTINUED] predniSONE (STERAPRED UNI-PAK 21 TAB) 10 MG (21) TBPK tablet Take 6 pills on day one then decrease by 1 pill each day  . [DISCONTINUED] Vitamin D, Ergocalciferol, (DRISDOL) 50000 units CAPS capsule Take 1 capsule (50,000 Units total) by mouth once a week. For 12 weeks   No facility-administered encounter medications on file as of 10/12/2018.     Health Maintenance/Date Completed  Last ED visit: 3 months ago Last Visit to PCP: about a month ago Next Visit to PCP: this month Specialist Visit: no Dental Exam: no Eye Exam: no Prostate  Exam: NA Pelvic/PAP Exam: 3 or 4 months Mammogram: last summer (7 or 8 months) DEXA: NA Colonoscopy: no Flu Vaccine: no Pneumonia Vaccine: no  Assessment and Plan: Anxiety/depression: takes duloxetine. Working well. Does not report any issues at this time.  Allergies/bronchitis: taking albuterol PRN. Typically uses once or twice a week. Uses more when it's warm outside or if she has coughing spells at night.  HTN: takes propranolol. Reports she can tell when her BP is high because she will get a headache. She does sometimes miss her evening dose of propranolol and this is when she may get the headache. Denies any other issues.  Diabetes: takes metformin. Reports nausea that has not gone away despite having been on this medication for some time. She does take it with food. She reports that she cuts them in half to try to relieve the nausea so really she has only been taking 250 mg BID. We discussed that normally the stomach issues go away after some time, but since she has been on it for a while with no relief, she may need to try something different so that she may get the maximum benefit for her diabetes. I discussed the possibility of switching to metformin XR which may be more tolerable. I encouraged her to ask her doctor at her next visit this month and I will attempt to contact her doctor at [facility] as well.  Compliance: She does report occasionally missing doses of her evening medications.  I will have her return to clinic in 6 months for follow up MTM to discuss any new medications or changes. We discussed the possibility of yearly follow up after next visit.  Tawnya Crook, PharmD Pharmacy Resident

## 2018-10-25 ENCOUNTER — Encounter: Payer: Self-pay | Admitting: Emergency Medicine

## 2018-10-25 ENCOUNTER — Other Ambulatory Visit: Payer: Self-pay

## 2018-10-25 ENCOUNTER — Emergency Department
Admission: EM | Admit: 2018-10-25 | Discharge: 2018-10-25 | Disposition: A | Discharge status: Home / Self Care | Payer: Medicaid Other | Attending: Emergency Medicine | Admitting: Emergency Medicine

## 2018-10-25 DIAGNOSIS — Z7984 Long term (current) use of oral hypoglycemic drugs: Secondary | ICD-10-CM | POA: Insufficient documentation

## 2018-10-25 DIAGNOSIS — F17210 Nicotine dependence, cigarettes, uncomplicated: Secondary | ICD-10-CM | POA: Insufficient documentation

## 2018-10-25 DIAGNOSIS — E119 Type 2 diabetes mellitus without complications: Secondary | ICD-10-CM | POA: Insufficient documentation

## 2018-10-25 DIAGNOSIS — Z79899 Other long term (current) drug therapy: Secondary | ICD-10-CM | POA: Insufficient documentation

## 2018-10-25 DIAGNOSIS — M12811 Other specific arthropathies, not elsewhere classified, right shoulder: Secondary | ICD-10-CM | POA: Insufficient documentation

## 2018-10-25 DIAGNOSIS — M5030 Other cervical disc degeneration, unspecified cervical region: Secondary | ICD-10-CM

## 2018-10-25 DIAGNOSIS — I10 Essential (primary) hypertension: Secondary | ICD-10-CM | POA: Insufficient documentation

## 2018-10-25 MED ORDER — CYCLOBENZAPRINE HCL 10 MG PO TABS
10.0000 mg | ORAL_TABLET | Freq: Once | ORAL | Status: AC
  Administered 2018-10-25: 10 mg via ORAL
  Filled 2018-10-25: qty 1

## 2018-10-25 MED ORDER — GABAPENTIN 300 MG PO CAPS: 300.0000 mg | ORAL_CAPSULE | Freq: Two times a day (BID) | ORAL | 0 refills | Status: AC

## 2018-10-25 MED ORDER — CYCLOBENZAPRINE HCL 5 MG PO TABS: 5.0000 mg | ORAL_TABLET | Freq: Three times a day (TID) | ORAL | 0 refills | Status: DC | PRN

## 2018-10-25 MED ORDER — KETOROLAC TROMETHAMINE 10 MG PO TABS: 10.0000 mg | ORAL_TABLET | Freq: Three times a day (TID) | ORAL | 0 refills | Status: DC

## 2018-10-25 MED ORDER — KETOROLAC TROMETHAMINE 30 MG/ML IJ SOLN
30.0000 mg | Freq: Once | INTRAMUSCULAR | Status: AC
  Administered 2018-10-25: 30 mg via INTRAMUSCULAR
  Filled 2018-10-25: qty 1

## 2018-10-25 NOTE — ED Triage Notes (Signed)
Pt in via POV with complaints of neck pain with radiation down right arm x approximately 1-2 months.  Pt reports muscle spasms to posterior neck as well.  Pt ambulatory to triage, vitals WDL, NAD noted at this time.

## 2018-10-25 NOTE — ED Notes (Signed)
Reference triage note. Pt unable to turn neck side to side at this time. Pt appears to be rigid with movements. Pt in NAD at this time.

## 2018-10-25 NOTE — ED Provider Notes (Signed)
[facility] Emergency Department Provider Note ____________________________________________  Time seen: 1711  I have reviewed the triage vital signs and the nursing notes.  HISTORY  Chief Complaint  Neck Pain  HPI Brenda Simmons is a 43 y.o. female presents to the ED accompanied by her mother, for evaluation of chronic right shoulder and cervical pain.  Patient with a history of rotator cuff on the right that was in need of repair, was not repaired secondary to patient losing her medical benefits at the time surgery was scheduled.  She also has a history of significant DDD throughout the cervical spine, and reports some right upper extremity pain and paresthesias.  Patient previously been under the care of pain management at [facility], but was dismissed from care after she no showed for 2 appointments.  She presents at this time without any current specialty care, for management of her ongoing symptoms patient denies any recent injury, accident, or trauma.  She is also denied any chest pain, shortness of breath, weakness, or syncope.  Past Medical History:  Diagnosis Date  . Anxiety   . Breast mass x 3 mos   felt by MD, pt does not feel it  . Bulging lumbar disc    Seen Pain Clinic at [facility]  . Depression    Seeing Mental Health Professional  . Diabetes mellitus without complication ([place])   . Hypertension   . Migraines   . Partial tear of rotator cuff    Injections did not help  . Pre-diabetes    On Metformin.    Patient Active Problem List   Diagnosis Date Noted  . Right-sided low back pain with right-sided sciatica 12/18/2015  . Migraine headache 12/04/2015  . Migrainous dizziness 12/04/2015  . Viral URI 09/11/2015  . Breast mass, right 09/11/2015  . Nonspecific abnormal electrocardiogram (ECG) (EKG) 09/08/2015  . Vitamin D deficiency 08/18/2015  . Pap smear of cervix unsatisfactory 08/18/2015  . Annual physical exam 07/30/2015  . Chronic neck  and back pain 07/30/2015  . Chronic right shoulder pain 07/30/2015  . Elevated platelet count 07/16/2015  . Persistent dry cough 07/14/2015  . Pre-diabetes 07/14/2015  . Disorder of rotator cuff 04/25/2014    Past Surgical History:  Procedure Laterality Date  . ANKLE SURGERY Left   . BREAST CYST ASPIRATION Right age 66  . OTHER SURGICAL HISTORY Left ankle surgery  . SALPINGECTOMY Bilateral     Prior to Admission medications   Medication Sig Start Date End Date Taking? Authorizing Provider  albuterol (PROVENTIL HFA;VENTOLIN HFA) 108 (90 Base) MCG/ACT inhaler Inhale 2 puffs into the lungs every 6 (six) hours as needed for wheezing or shortness of breath. 08/30/17   Laban Emperor, PA-C  Aspirin-Salicylamide-Caffeine (BC HEADACHE POWDER PO) Take 1 packet by mouth as needed.    [provider]  cetirizine (ZYRTEC) 10 MG tablet Take 10 mg by mouth daily.    [provider]  DULoxetine (CYMBALTA) 60 MG capsule Take 60 mg by mouth daily.    [provider]  ibuprofen (ADVIL,MOTRIN) 600 MG tablet Take 600 mg by mouth every 6 (six) hours as needed for cramping.    [provider]  metFORMIN (GLUCOPHAGE) 500 MG tablet Take 500 mg by mouth 2 (two) times daily. 12/05/12   [provider]  Multiple Vitamin (MULTIVITAMIN WITH MINERALS) TABS tablet Take 1 tablet by mouth daily.    [provider]  propranolol (INDERAL) 40 MG tablet Take 40 mg by mouth 2 (  two) times daily. 12/05/12   [provider]  traZODone (DESYREL) 50 MG tablet Take 50 mg by mouth at bedtime.    [provider]    Allergies Patient has no known allergies.  Family History  Problem Relation Age of Onset  . Depression Mother   . Hypertension Mother   . Diabetes Mother   . Hypertension Father   . Hypertension Sister   . Depression Brother   . Cancer Maternal Grandmother   . Cancer Paternal Grandmother   . Breast cancer Neg Hx     Social History Social  History   Tobacco Use  . Smoking status: Current Every Day Smoker    Packs/day: 0.50    Years: 15.00    Pack years: 7.50    Types: Cigarettes  . Smokeless tobacco: Never Used  . Tobacco comment: cutting back  Substance Use Topics  . Alcohol use: Yes    Comment: Occasionally during Holidays  . Drug use: No    Review of Systems  Constitutional: Negative for fever. Eyes: Negative for visual changes. ENT: Negative for sore throat. Cardiovascular: Negative for chest pain. Respiratory: Negative for shortness of breath. Gastrointestinal: Negative for abdominal pain, vomiting and diarrhea. Genitourinary: Negative for dysuria. Musculoskeletal: Positive for nedk pain. Right shoulder pain as above Skin: Negative for rash. Neurological: Negative for headaches, focal weakness or numbness. ____________________________________________  PHYSICAL EXAM:  VITAL SIGNS: ED Triage Vitals  Enc Vitals Group     BP 10/25/18 1623 (!) 169/99     Pulse Rate 10/25/18 1623 67     Resp 10/25/18 1623 16     Temp 10/25/18 1623 97.8 F (36.6 C)     Temp Source 10/25/18 1623 Oral     SpO2 10/25/18 1623 99 %     Weight 10/25/18 1624 235 lb (106.6 kg)     Height 10/25/18 1624 5\' 4"  (1.626 m)     Head Circumference --      Peak Flow --      Pain Score 10/25/18 1623 9     Pain Loc --      Pain Edu? --      Excl. in [place]? --     Constitutional: Alert and oriented. Well appearing and in no distress. Head: Normocephalic and atraumatic. Eyes: Conjunctivae are normal. Normal extraocular movements Neck: Supple. Normal ROM without crepitus. No midline tenderness Cardiovascular: Normal rate, regular rhythm. Normal distal pulses. Respiratory: Normal respiratory effort. No wheezes/rales/rhonchi. Musculoskeletal: Right arm without obvious deformity or dislocation.  Patient with normal rotator cuff resistance testing.  Nontender with normal range of motion in all extremities.  Neurologic: Cranial nerves II  through XII grossly intact.  Normal UEs DTRs bilaterally.  Normal intrinsic and opposition testing noted.  Normal gait without ataxia. Normal speech and language. No gross focal neurologic deficits are appreciated. Skin:  Skin is warm, dry and intact. No rash noted. ____________________________________________  PROCEDURES  Procedures Toradol 30 mg IM Flexeril 10 mg PO ____________________________________________  INITIAL IMPRESSION / ASSESSMENT AND PLAN / ED COURSE  Patient with ED evaluation of chronic neck pain secondary to underlying DDD.  She also has some chronic ongoing right shoulder pain secondary to her chronic rotator cuff arthropathy.  Patient is under the care of [facility], but needs to be under the care of specialty providers.  She currently has charity care through [facility], but was dismissed from pain management secondary to missed appointments.  She understands she should be evaluated and managed  by Ortho and or neurology at this time.  We will attempt to make referral to the patient for further evaluation management.  She will be discharged with prescriptions for Toradol, Flexeril, and gabapentin.  She should follow-up with the primary provider or return to the ED as needed. ____________________________________________  FINAL CLINICAL IMPRESSION(S) / ED DIAGNOSES  Final diagnoses:  DDD (degenerative disc disease), cervical  Rotator cuff arthropathy, right     Carmie End, Dannielle Karvonen, PA-C 10/25/18 1833    Schuyler Amor, MD 10/25/18 2059

## 2018-10-25 NOTE — Discharge Instructions (Addendum)
Your exam and history of rotator cuff tear and cervical spine DDD, warrant management by specialists. You should follow-up with [facility] for further care of your chronic conditions. Take the prescription meds as directed.

## 2018-12-11 ENCOUNTER — Telehealth: Payer: Self-pay | Admitting: Pharmacy Technician

## 2018-12-11 NOTE — Telephone Encounter (Signed)
Received 2020 proof of income.  Patient eligible to receive medication assistance at [facility] as long as eligibility requirements continue to be met.  [place] [facility]

## 2019-04-12 ENCOUNTER — Encounter: Payer: Self-pay | Admitting: Pharmacist

## 2019-04-12 ENCOUNTER — Ambulatory Visit: Payer: Medicaid Other | Admitting: Pharmacist

## 2019-04-12 DIAGNOSIS — Z79899 Other long term (current) drug therapy: Secondary | ICD-10-CM

## 2019-04-12 NOTE — Progress Notes (Signed)
Medication Management Clinic Visit Note  Patient: Brenda Simmons MRN: [id number] Date of Birth: 03/13/1976 PCP: [facility]   Brenda Simmons 43 y.o. female was contacted for a medication therapy management Outreach Phone Call. Identity was confirmed with DOB and address.  There were no vitals taken for this visit.  Patient Information   Past Medical History:  Diagnosis Date  . Anxiety   . Breast mass x 3 mos   felt by MD, pt does not feel it  . Bulging lumbar disc    Seen Pain Clinic at [facility]  . Depression    Seeing Mental Health Professional  . Diabetes mellitus without complication ([place])   . Hypertension   . Migraines   . Partial tear of rotator cuff    Injections did not help  . Pre-diabetes    On Metformin.      Past Surgical History:  Procedure Laterality Date  . ANKLE SURGERY Left   . BREAST CYST ASPIRATION Right age 56  . OTHER SURGICAL HISTORY Left ankle surgery  . SALPINGECTOMY Bilateral      Family History  Problem Relation Age of Onset  . Depression Mother   . Hypertension Mother   . Diabetes Mother   . Hypertension Father   . Hypertension Sister   . Depression Brother   . Cancer Maternal Grandmother   . Cancer Paternal Grandmother   . Breast cancer Neg Hx     New Diagnoses (since last visit): N/A  Family Support: Good, takes care of mom and each of them keeps the other accountable for checking BP, BG and taking medications  Lifestyle Diet: Breakfast: typically does not eat, but occasionally eats cereal Lunch: typically eats out - ham/cheese sub Dinner: lasagna, spaghetti, or fried chicken Drinks: water, gatorade   Social History   Substance and Sexual Activity  Alcohol Use Yes   Comment: Occasionally during Holidays      Social History   Tobacco Use  Smoking Status Current Every Day Smoker  . Packs/day: 0.25  . Years: 15.00  . Pack years: 3.75  . Types: Cigarettes  Smokeless Tobacco Never Used   Tobacco Comment   cutting back      Health Maintenance  Topic Date Due  . HIV Screening  10/20/1990  . TETANUS/TDAP  10/20/1994  . PAP SMEAR-Modifier  08/17/2018  . INFLUENZA VACCINE  04/28/2019   Outpatient Encounter Medications as of 04/12/2019  Medication Sig  . albuterol (PROVENTIL HFA;VENTOLIN HFA) 108 (90 Base) MCG/ACT inhaler Inhale 2 puffs into the lungs every 6 (six) hours as needed for wheezing or shortness of breath.  Marland Kitchen atorvastatin (LIPITOR) 20 MG tablet Take 20 mg by mouth daily.  . cetirizine (ZYRTEC) 10 MG tablet Take 10 mg by mouth daily.  . DULoxetine (CYMBALTA) 60 MG capsule Take 60 mg by mouth daily as needed (mood).   . gabapentin (NEURONTIN) 300 MG capsule Take 1 capsule (300 mg total) by mouth 2 (two) times daily for 30 days. (Patient taking differently: Take 300 mg by mouth daily as needed (pain). )  . ibuprofen (ADVIL,MOTRIN) 600 MG tablet Take 600 mg by mouth every 6 (six) hours as needed for cramping.  . metFORMIN (GLUCOPHAGE) 500 MG tablet Take 500 mg by mouth 2 (two) times daily.  . montelukast (SINGULAIR) 10 MG tablet Take 10 mg by mouth daily.  . propranolol (INDERAL) 40 MG tablet Take 40 mg by mouth 2 (two) times daily.  . traZODone (DESYREL) 50 MG  tablet Take 50 mg by mouth at bedtime as needed for sleep.   . Aspirin-Salicylamide-Caffeine (BC HEADACHE POWDER PO) Take 1 packet by mouth as needed.  . cyclobenzaprine (FLEXERIL) 5 MG tablet Take 1 tablet (5 mg total) by mouth 3 (three) times daily as needed for muscle spasms. (Patient not taking: Reported on 04/12/2019)  . ketorolac (TORADOL) 10 MG tablet Take 1 tablet (10 mg total) by mouth every 8 (eight) hours. (Patient not taking: Reported on 04/12/2019)  . Multiple Vitamin (MULTIVITAMIN WITH MINERALS) TABS tablet Take 1 tablet by mouth daily.   No facility-administered encounter medications on file as of 04/12/2019.     Assessment and Plan:  1. Adherence Pt reports rarely missing any doses of  maintenance meds as pt and pt's mother live together and hold each other accountable. Pt reports only desiring to take anxiety/depression medicine PRN as pt would rather not take it daily if she does not feel bad. Discussed other meds that could be dosed for anxiety PRN if pt would prefer meds that are not prescribed daily. Pt voiced understanding.   2. HTN Hx of hypertension and migraines on propranolol 40mg  twice daily. Pt reports never missing doses as she gets a headache if she doesn't take it. Pt reports checking her BP consistently and notes that it is sometimes elevated. Pt endorses a high salt diet and verbalized understanding of low sodium alternatives. Spoke with pt regarding daily exercise with dog and pt agreed to goal of walking dog 7min/d for 5d/wk.  3. DM Hx of T2DM on metformin 500mg  BID. Most recent A1c in chart (2016) 5.7 but most likely has been rechecked with PCP at [facility]. Pt reports that the medicine occasionally makes her feel nauseous but rarely misses doses. Pt endorses regularly checking BG and understanding of BG goals.   4. Smoking Cessation At last MTM, pt reported smoking 0.5ppd with desire to cut back (7.5 pack-years) and reported now only smokes 5-6 cigarettes/day. Congratulated pt on efforts to cut back and spoke with pt regarding smoking habits with meals and options for changing up her routine. Pt verbalized understanding of available resources for quitting.   5. Anxiety/Depression/Pain Hx of DDD (seen in ED Jan 2020), anxiety, and depression on gabapentin, ibuprofen, trazodone, and Cymbalta. Pt reports only taking these meds PRN and still receiving benefit. Spoke with pt regarding alternative PRN only medications and pt verbalized understanding.    6. Asthma/Allergies Pt was recently prescribed montelukast 10mg  daily and continues to take albuterol PRN and cetirizine daily. Pt reports no issues.   RTC: 1y (04/11/2020)  Ladoris Gene, PharmD Candidate [place] [facility]   Cosigned by: Netta Neat, PharmD, [facility]) [phone]

## 2019-04-16 ENCOUNTER — Other Ambulatory Visit: Payer: Self-pay

## 2020-05-02 ENCOUNTER — Telehealth: Payer: Self-pay | Admitting: Pharmacy Technician

## 2020-05-02 NOTE — Telephone Encounter (Signed)
Received updated proof of income.  Patient eligible to receive medication assistance at [facility] until time for re-certification in 9359, and as long as eligibility requirements continue to be met.  [place] [facility]

## 2020-12-04 ENCOUNTER — Telehealth: Payer: Self-pay | Admitting: Pharmacist

## 2020-12-04 NOTE — Telephone Encounter (Signed)
Provided 2022 proof of income. Approved to receive medication assistance at [facility] until time for re-certification in 5364, and as long as eligibility criteria continues to be met.   [place]

## 2021-01-22 ENCOUNTER — Other Ambulatory Visit: Payer: Self-pay

## 2021-01-23 ENCOUNTER — Other Ambulatory Visit: Payer: Self-pay

## 2021-01-23 MED ORDER — ALBUTEROL SULFATE HFA 108 (90 BASE) MCG/ACT IN AERS
INHALATION_SPRAY | RESPIRATORY_TRACT | 11 refills | Status: DC
  Filled 2021-01-23: qty 8.5, 17d supply, fill #0

## 2021-01-23 MED ORDER — FLUTICASONE PROPIONATE 50 MCG/ACT NA SUSP
NASAL | 11 refills | Status: DC
  Filled 2021-01-23: qty 16, 30d supply, fill #0

## 2021-01-23 MED ORDER — OMEPRAZOLE 20 MG PO CPDR
DELAYED_RELEASE_CAPSULE | ORAL | 3 refills | Status: DC
  Filled 2021-01-23: qty 90, 90d supply, fill #0

## 2021-01-23 MED ORDER — RIGHTEST GS550 BLOOD GLUCOSE VI STRP
ORAL_STRIP | 11 refills | Status: DC
  Filled 2021-01-23: qty 100, 30d supply, fill #0

## 2021-01-23 MED ORDER — ATORVASTATIN CALCIUM 20 MG PO TABS
ORAL_TABLET | ORAL | 3 refills | Status: DC
  Filled 2021-01-23 – 2021-10-06 (×2): qty 90, 90d supply, fill #0

## 2021-01-23 MED ORDER — DULOXETINE HCL 60 MG PO CPEP
60.0000 mg | ORAL_CAPSULE | Freq: Every day | ORAL | 3 refills | Status: DC
  Filled 2021-01-23: qty 30, 30d supply, fill #0
  Filled 2021-10-06: qty 90, 90d supply, fill #0

## 2021-01-23 MED ORDER — RIGHTEST GS550 BLOOD GLUCOSE VI STRP
ORAL_STRIP | 11 refills | Status: AC
Start: 2021-01-23 — End: ?
  Filled 2021-01-23: qty 100, 30d supply, fill #0

## 2021-01-23 MED ORDER — TRADJENTA 5 MG PO TABS
ORAL_TABLET | ORAL | 3 refills | Status: DC
  Filled 2021-01-23: qty 90, 90d supply, fill #0

## 2021-01-23 MED ORDER — PROPRANOLOL HCL ER 60 MG PO CP24
ORAL_CAPSULE | ORAL | 3 refills | Status: DC
  Filled 2021-10-06: qty 180, 90d supply, fill #0

## 2021-01-23 MED ORDER — MONTELUKAST SODIUM 10 MG PO TABS
ORAL_TABLET | ORAL | 3 refills | Status: DC
  Filled 2021-01-23 – 2021-10-06 (×2): qty 90, 90d supply, fill #0

## 2021-01-23 MED ORDER — METFORMIN HCL 500 MG PO TABS
ORAL_TABLET | ORAL | 3 refills | Status: DC
  Filled 2021-01-23: qty 90, 90d supply, fill #0

## 2021-02-13 ENCOUNTER — Other Ambulatory Visit: Payer: Self-pay

## 2021-02-27 ENCOUNTER — Other Ambulatory Visit: Payer: Self-pay

## 2021-03-03 ENCOUNTER — Other Ambulatory Visit: Payer: Self-pay

## 2021-03-23 ENCOUNTER — Other Ambulatory Visit: Payer: Self-pay

## 2021-08-10 ENCOUNTER — Other Ambulatory Visit: Payer: Self-pay

## 2021-08-10 MED ORDER — METFORMIN HCL 500 MG PO TABS
ORAL_TABLET | ORAL | 3 refills | Status: DC
  Filled 2021-08-10: qty 30, 30d supply, fill #0
  Filled 2021-10-06: qty 30, 30d supply, fill #1

## 2021-08-10 MED ORDER — ONDANSETRON 4 MG PO TBDP
ORAL_TABLET | ORAL | 0 refills | Status: DC
  Filled 2021-08-10: qty 20, 7d supply, fill #0

## 2021-08-10 MED ORDER — OMEPRAZOLE 20 MG PO CPDR
DELAYED_RELEASE_CAPSULE | ORAL | 3 refills | Status: DC
  Filled 2021-08-10: qty 30, 30d supply, fill #0
  Filled 2021-10-06: qty 30, 30d supply, fill #1

## 2021-08-11 ENCOUNTER — Other Ambulatory Visit: Payer: Self-pay

## 2021-10-06 ENCOUNTER — Other Ambulatory Visit: Payer: Self-pay

## 2021-10-07 ENCOUNTER — Other Ambulatory Visit: Payer: Self-pay

## 2021-10-08 ENCOUNTER — Other Ambulatory Visit: Payer: Self-pay

## 2021-10-08 MED ORDER — PROPRANOLOL HCL ER 120 MG PO CP24
ORAL_CAPSULE | ORAL | 3 refills | Status: AC
  Filled 2021-10-08: qty 90, 90d supply, fill #0
  Filled 2022-03-01: qty 30, 30d supply, fill #0
  Filled 2022-04-01: qty 30, 30d supply, fill #1
  Filled 2022-05-05: qty 30, 30d supply, fill #2

## 2021-10-08 MED ORDER — ATORVASTATIN CALCIUM 20 MG PO TABS
ORAL_TABLET | ORAL | 3 refills | Status: DC
  Filled 2021-10-08: qty 90, 90d supply, fill #0

## 2021-10-08 MED ORDER — DULOXETINE HCL 30 MG PO CPEP
60.0000 mg | ORAL_CAPSULE | Freq: Every day | ORAL | 3 refills | Status: DC
  Filled 2021-10-08: qty 60, 30d supply, fill #0

## 2021-10-08 MED ORDER — CETIRIZINE-PSEUDOEPHEDRINE ER 5-120 MG PO TB12: 1.0000 | ORAL_TABLET | Freq: Two times a day (BID) | ORAL | 2 refills | Status: DC

## 2021-10-08 MED ORDER — ALBUTEROL SULFATE HFA 108 (90 BASE) MCG/ACT IN AERS
INHALATION_SPRAY | RESPIRATORY_TRACT | 11 refills | Status: AC
  Filled 2021-10-08: qty 8.5, 17d supply, fill #0
  Filled 2022-03-01: qty 6.7, 30d supply, fill #0

## 2021-10-08 MED ORDER — TRADJENTA 5 MG PO TABS: ORAL_TABLET | ORAL | 3 refills | Status: DC

## 2021-10-08 MED ORDER — MONTELUKAST SODIUM 10 MG PO TABS
ORAL_TABLET | ORAL | 3 refills | Status: DC
  Filled 2021-10-08: qty 90, 90d supply, fill #0
  Filled 2022-03-01: qty 30, 30d supply, fill #0

## 2021-10-08 MED ORDER — FLUTICASONE PROPIONATE 50 MCG/ACT NA SUSP
2.0000 | Freq: Two times a day (BID) | NASAL | 11 refills | Status: AC
  Filled 2021-10-08 – 2022-03-01 (×2): qty 16, 30d supply, fill #0

## 2021-10-08 MED ORDER — OMEPRAZOLE 20 MG PO CPDR
DELAYED_RELEASE_CAPSULE | ORAL | 3 refills | Status: AC
  Filled 2021-10-08 – 2022-04-01 (×2): qty 90, 90d supply, fill #0

## 2021-10-08 MED ORDER — ONDANSETRON 4 MG PO TBDP
4.0000 mg | ORAL_TABLET | ORAL | 11 refills | Status: AC
  Filled 2021-10-08: qty 20, 4d supply, fill #0
  Filled 2022-02-11: qty 20, 4d supply, fill #1
  Filled 2022-02-15: qty 20, 4d supply, fill #0
  Filled 2022-04-01: qty 20, 4d supply, fill #1
  Filled 2022-05-05: qty 20, 4d supply, fill #2

## 2021-10-08 MED ORDER — METFORMIN HCL 500 MG PO TABS
ORAL_TABLET | Freq: Every day | ORAL | 3 refills | Status: DC
  Filled 2021-10-08: qty 90, 90d supply, fill #0
  Filled 2022-02-11 – 2022-02-15 (×2): qty 30, 30d supply, fill #0
  Filled 2022-04-01: qty 30, 30d supply, fill #1
  Filled 2022-05-05: qty 30, 30d supply, fill #2

## 2021-10-08 MED ORDER — RIGHTEST GL300 LANCETS MISC
0 refills | Status: AC
  Filled 2021-10-08: qty 100, 100d supply, fill #0

## 2021-11-04 ENCOUNTER — Other Ambulatory Visit: Payer: Self-pay

## 2021-11-04 MED ORDER — MELOXICAM 15 MG PO TABS
15.0000 mg | ORAL_TABLET | Freq: Every day | ORAL | 1 refills | Status: DC
  Filled 2021-11-04 – 2022-03-01 (×2): qty 30, 30d supply, fill #0

## 2021-11-05 ENCOUNTER — Other Ambulatory Visit: Payer: Self-pay

## 2021-11-06 ENCOUNTER — Other Ambulatory Visit: Payer: Self-pay

## 2021-11-18 ENCOUNTER — Other Ambulatory Visit: Payer: Self-pay

## 2021-11-19 ENCOUNTER — Other Ambulatory Visit: Payer: Self-pay

## 2022-01-21 ENCOUNTER — Telehealth: Payer: Self-pay | Admitting: Pharmacy Technician

## 2022-01-21 NOTE — Telephone Encounter (Signed)
Provided documentation for [facility] approval. ? ?Jacquelynn Cree ?Care Manager ?[facility]  ?

## 2022-01-24 ENCOUNTER — Emergency Department
Admission: EM | Admit: 2022-01-24 | Discharge: 2022-01-24 | Disposition: A | Discharge status: Home / Self Care | Payer: Self-pay | Attending: Emergency Medicine | Admitting: Emergency Medicine

## 2022-01-24 ENCOUNTER — Emergency Department: Payer: Self-pay

## 2022-01-24 ENCOUNTER — Other Ambulatory Visit: Payer: Self-pay

## 2022-01-24 DIAGNOSIS — E1165 Type 2 diabetes mellitus with hyperglycemia: Secondary | ICD-10-CM | POA: Insufficient documentation

## 2022-01-24 DIAGNOSIS — S1093XA Contusion of unspecified part of neck, initial encounter: Secondary | ICD-10-CM | POA: Insufficient documentation

## 2022-01-24 DIAGNOSIS — W010XXA Fall on same level from slipping, tripping and stumbling without subsequent striking against object, initial encounter: Secondary | ICD-10-CM | POA: Insufficient documentation

## 2022-01-24 DIAGNOSIS — S300XXA Contusion of lower back and pelvis, initial encounter: Secondary | ICD-10-CM | POA: Insufficient documentation

## 2022-01-24 DIAGNOSIS — M62838 Other muscle spasm: Secondary | ICD-10-CM

## 2022-01-24 DIAGNOSIS — S60211A Contusion of right wrist, initial encounter: Secondary | ICD-10-CM | POA: Insufficient documentation

## 2022-01-24 DIAGNOSIS — R61 Generalized hyperhidrosis: Secondary | ICD-10-CM | POA: Insufficient documentation

## 2022-01-24 DIAGNOSIS — R739 Hyperglycemia, unspecified: Secondary | ICD-10-CM

## 2022-01-24 DIAGNOSIS — I10 Essential (primary) hypertension: Secondary | ICD-10-CM | POA: Insufficient documentation

## 2022-01-24 DIAGNOSIS — Z7984 Long term (current) use of oral hypoglycemic drugs: Secondary | ICD-10-CM | POA: Insufficient documentation

## 2022-01-24 DIAGNOSIS — W19XXXA Unspecified fall, initial encounter: Secondary | ICD-10-CM

## 2022-01-24 DIAGNOSIS — T07XXXA Unspecified multiple injuries, initial encounter: Secondary | ICD-10-CM

## 2022-01-24 DIAGNOSIS — S8001XA Contusion of right knee, initial encounter: Secondary | ICD-10-CM | POA: Insufficient documentation

## 2022-01-24 LAB — CBG MONITORING, ED: Glucose-Capillary: 360 mg/dL — ABNORMAL HIGH (ref 70–99)

## 2022-01-24 MED ORDER — LIDOCAINE 5 % EX PTCH
1.0000 | MEDICATED_PATCH | CUTANEOUS | Status: DC
  Administered 2022-01-24: 1 via TRANSDERMAL
  Filled 2022-01-24: qty 1

## 2022-01-24 MED ORDER — CYCLOBENZAPRINE HCL 10 MG PO TABS: 10.0000 mg | ORAL_TABLET | Freq: Three times a day (TID) | ORAL | 0 refills | Status: AC | PRN

## 2022-01-24 MED ORDER — IBUPROFEN 400 MG PO TABS
400.0000 mg | ORAL_TABLET | Freq: Once | ORAL | Status: AC
  Administered 2022-01-24: 400 mg via ORAL
  Filled 2022-01-24: qty 1

## 2022-01-24 MED ORDER — ACETAMINOPHEN 500 MG PO TABS
1000.0000 mg | ORAL_TABLET | Freq: Once | ORAL | Status: AC
Start: 2022-01-24 — End: 2022-01-24
  Administered 2022-01-24: 1000 mg via ORAL
  Filled 2022-01-24: qty 2

## 2022-01-24 NOTE — ED Notes (Signed)
Patient transported to CT 

## 2022-01-24 NOTE — ED Triage Notes (Signed)
Pt slipped on some water yesterday at [employer] and fell on her bottom. C/o bottom and back pain.  ?

## 2022-01-24 NOTE — ED Provider Notes (Addendum)
? ?[facility] ?Provider Note ? ? ? Event Date/Time  ? First MD Initiated Contact with Patient 01/24/22 1326   ?  (approximate) ? ? ?History  ? ?Fall ? ? ?HPI ? ?Brenda Simmons is a 46 y.o. female with past medical history of HTN, DM, depression, anxiety, migraine headaches presents for evaluation of neck pain, right wrist pain, left lower back pain and right knee and lower leg pain after a fall that occurred yesterday.  Patient states she was at [employer] when she was pushing a cart and slipped on some water and fell onto the right cart bracing herself with her right hand and scraping her right lower leg under the cart falling hard on her bottom.  She does not think she hit her head or neck but has developed some pain in her neck since then.  No LOC.  She is not on any blood thinners.  No headache, face pain, chest pain, abdominal pain, cough, fevers, nausea, vomiting, diarrhea any numbness, any acute incontinence or any other extremity pain.  She took some BC powders earlier today but no other analgesia.  No other acute concerns at this time. ? ?  ?Past Medical History:  ?Diagnosis Date  ? Anxiety   ? Breast mass x 3 mos  ? felt by MD, pt does not feel it  ? Bulging lumbar disc   ? Seen [facility] Clinic at [facility]  ? Depression   ? Seeing Mental Health Professional  ? Diabetes mellitus without complication ([place])   ? Hypertension   ? Migraines   ? Partial tear of rotator cuff   ? Injections did not help  ? Pre-diabetes   ? On Metformin.  ? ? ? ?Physical Exam  ?Triage Vital Signs: ?ED Triage Vitals  ?Enc Vitals Group  ?   BP 01/24/22 1310 (!) 143/86  ?   Pulse Rate 01/24/22 1310 81  ?   Resp 01/24/22 1310 20  ?   Temp 01/24/22 1310 98.2 ?F (36.8 ?C)  ?   Temp Source 01/24/22 1310 Oral  ?   SpO2 01/24/22 1310 94 %  ?   Weight 01/24/22 1310 215 lb (97.5 kg)  ?   Height --   ?   Head Circumference --   ?   Peak Flow --   ?   Pain Score 01/24/22 1309 8  ?   Pain Loc --   ?   Pain Edu? --   ?   Excl. in  [place]? --   ? ? ?Most recent vital signs: ?Vitals:  ? 01/24/22 1310  ?BP: (!) 143/86  ?Pulse: 81  ?Resp: 20  ?Temp: 98.2 ?F (36.8 ?C)  ?SpO2: 94%  ? ? ?General: Awake, no distress.  ?CV:  Good peripheral perfusion.  2+ radial and PT pulses. ?Resp:  Normal effort.  Bilaterally. ?Abd:  No distention.  Soft throughout. ?Other:  Chemistries normal grossly intact.  No extremity to face scalp head or neck.  Patient able turn her head to the left on the right.  There is some tenderness over the bilateral trapezius muscles and lower C-spine.  There is also some tenderness over the lower L-spine and left paralumbar muscles as well as pain on flexion extension of the left hip.  There is also some erythema over the anterior right knee and a very small abrasion over the anterior right tibia without any other surrounding skin changes.  Patient otherwise has full symmetric strength throughout the bilateral  upper and lower extremities.  She does have pain on range of motion of the right wrist.  No snuffbox tenderness. ? ? ?ED Results / Procedures / Treatments  ?Labs ?(all labs ordered are listed, but only abnormal results are displayed) ?Labs Reviewed  ?CBG MONITORING, ED - Abnormal; Notable for the following components:  ?    Result Value  ? Glucose-Capillary 360 (*)   ? All other components within normal limits  ? ? ? ?EKG ? ? ? ?RADIOLOGY ?Plain film of the right wrist, L-spine, left hip, right knee and right tib-fib on my review without evidence of fracture or dislocation.  I also reviewed radiology interpretation.  I agree with findings of some mild anterolisthesis of L5 on S1 without any other acute abnormalities. ? ?CT C-spine my interpretation without evidence of acute fracture dislocation.  I also reviewed radiology's findings.  Agree with their notation of some degenerative disc disease. ? ?PROCEDURES: ? ?Critical Care performed: No ? ?Procedures ? ? ? ?MEDICATIONS ORDERED IN ED: ?Medications  ?lidocaine (LIDODERM) 5 % 1  patch (1 patch Transdermal Patch Applied 01/24/22 1350)  ?acetaminophen (TYLENOL) tablet 1,000 mg (1,000 mg Oral Given 01/24/22 1349)  ?ibuprofen (ADVIL) tablet 400 mg (400 mg Oral Given 01/24/22 1349)  ? ? ? ?IMPRESSION / MDM / ASSESSMENT AND PLAN / ED COURSE  ?I reviewed the triage vital signs and the nursing notes. ?             ?               ? ?Differential diagnosis includes, but is not limited to torsion, muscle spasm and possibly underlying occult fracture in the C-spine, right wrist, left hip or L-spine as well as at the right knee or tib-fib.  I have a low suspicion for other significant or visceral injury.  Patient describes a clear mechanical mechanism for fall and have low suspicion for syncope or presyncopal etiology. ? ?Plain film of the right wrist, L-spine, left hip, right knee and right tib-fib on my review without evidence of fracture or dislocation.  I also reviewed radiology interpretation.  I agree with findings of some mild anterolisthesis of L5 on S1 without any other acute abnormalities. ? ?CT C-spine my interpretation without evidence of acute fracture dislocation.  I also reviewed radiology's findings.  Agree with their notation of some degenerative disc disease. ? ?Patient is feeling much better on my reassessment.  She did request her blood sugar rechecked because she was feeling a little sweaty earlier.  It is actually elevated at 360 but she has no other acute symptoms and is feeling much better.  I have a low suspicion for other significant metabolic derangement I think she is appropriate for close outpatient PCP follow-up to have her sugars rechecked and possibly her insulin regimen adjusted.  She has no other acute concerns at this time.  She is requesting short course of muscle relaxant which I will write.  Discussed only using when she is at home and can rest on the couch or bed and did not use before driving or operating machinery.  She is in agreement this plan.  Discharged in  stable condition with return precautions advised and discussed.  Emphasized importance of having her sugar and blood pressure rechecked at follow-up visit. ?  ? ? ?FINAL CLINICAL IMPRESSION(S) / ED DIAGNOSES  ? ?Final diagnoses:  ?Fall, initial encounter  ?Hyperglycemia  ?Multiple contusions  ?Muscle spasm  ? ? ? ?Rx / DC Orders  ? ?  ED Discharge Orders   ? ?      Ordered  ?  cyclobenzaprine (FLEXERIL) 10 MG tablet  3 times daily PRN       ? 01/24/22 1529  ? ?  ?  ? ?  ? ? ? ?Note:  This document was prepared using Dragon voice recognition software and may include unintentional dictation errors. ?  ?Lucrezia Starch, MD ?01/24/22 1456 ? ?  ?Lucrezia Starch, MD ?01/24/22 1531 ? ?

## 2022-01-28 ENCOUNTER — Other Ambulatory Visit: Payer: Self-pay

## 2022-02-11 ENCOUNTER — Other Ambulatory Visit: Payer: Self-pay

## 2022-02-12 ENCOUNTER — Other Ambulatory Visit: Payer: Self-pay

## 2022-02-15 ENCOUNTER — Other Ambulatory Visit: Payer: Self-pay

## 2022-03-01 ENCOUNTER — Other Ambulatory Visit: Payer: Self-pay

## 2022-03-01 MED ORDER — ATORVASTATIN CALCIUM 20 MG PO TABS
ORAL_TABLET | ORAL | 3 refills | Status: DC
  Filled 2022-03-01: qty 90, 90d supply, fill #0

## 2022-03-12 NOTE — Progress Notes (Signed)
03-12-2022 Received medical record request for patient Brenda Simmons, dob-August 21, 1976 from [employer] for dates of service 12/26/2020 to present. Per [facility] medical records department review, no [facility] medical records for dates of service requested. Letter mailed to [employer], at [facility] [address], [place], [place] [address], advising of the above. Inetta Fermo RN

## 2022-03-14 ENCOUNTER — Emergency Department
Admission: EM | Admit: 2022-03-14 | Discharge: 2022-03-14 | Disposition: A | Discharge status: Home / Self Care | Payer: Medicaid Other | Attending: Emergency Medicine | Admitting: Emergency Medicine

## 2022-03-14 ENCOUNTER — Encounter: Payer: Self-pay | Admitting: Emergency Medicine

## 2022-03-14 ENCOUNTER — Other Ambulatory Visit: Payer: Self-pay

## 2022-03-14 ENCOUNTER — Emergency Department: Payer: Medicaid Other

## 2022-03-14 DIAGNOSIS — I10 Essential (primary) hypertension: Secondary | ICD-10-CM | POA: Insufficient documentation

## 2022-03-14 DIAGNOSIS — E119 Type 2 diabetes mellitus without complications: Secondary | ICD-10-CM | POA: Insufficient documentation

## 2022-03-14 DIAGNOSIS — R109 Unspecified abdominal pain: Secondary | ICD-10-CM | POA: Insufficient documentation

## 2022-03-14 DIAGNOSIS — N3001 Acute cystitis with hematuria: Secondary | ICD-10-CM | POA: Insufficient documentation

## 2022-03-14 DIAGNOSIS — R350 Frequency of micturition: Secondary | ICD-10-CM | POA: Insufficient documentation

## 2022-03-14 DIAGNOSIS — D72829 Elevated white blood cell count, unspecified: Secondary | ICD-10-CM | POA: Insufficient documentation

## 2022-03-14 HISTORY — DX: Unspecified chronic bronchitis: J42

## 2022-03-14 LAB — CBC
HCT: 42.2 % (ref 36.0–46.0)
Hemoglobin: 13.4 g/dL (ref 12.0–15.0)
MCH: 29 pg (ref 26.0–34.0)
MCHC: 31.8 g/dL (ref 30.0–36.0)
MCV: 91.3 fL (ref 80.0–100.0)
Platelets: 374 10*3/uL (ref 150–400)
RBC: 4.62 MIL/uL (ref 3.87–5.11)
RDW: 11.9 % (ref 11.5–15.5)
WBC: 11.8 10*3/uL — ABNORMAL HIGH (ref 4.0–10.5)
nRBC: 0 % (ref 0.0–0.2)

## 2022-03-14 LAB — BASIC METABOLIC PANEL
Anion gap: 4 — ABNORMAL LOW (ref 5–15)
BUN: 8 mg/dL (ref 6–20)
CO2: 26 mmol/L (ref 22–32)
Calcium: 8.4 mg/dL — ABNORMAL LOW (ref 8.9–10.3)
Chloride: 107 mmol/L (ref 98–111)
Creatinine, Ser: 0.69 mg/dL (ref 0.44–1.00)
GFR, Estimated: >60 mL/min (ref >60)
Glucose, Bld: 250 mg/dL — ABNORMAL HIGH (ref 70–99)
Potassium: 3.6 mmol/L (ref 3.5–5.1)
Sodium: 137 mmol/L (ref 135–145)

## 2022-03-14 LAB — URINALYSIS, ROUTINE W REFLEX MICROSCOPIC
Bilirubin Urine: NEGATIVE
Glucose, UA: 50 mg/dL — AB
Ketones, ur: NEGATIVE mg/dL
Leukocytes,Ua: NEGATIVE
Nitrite: NEGATIVE
Protein, ur: 30 mg/dL — AB
Specific Gravity, Urine: 1.028 (ref 1.005–1.030)
pH: 5 (ref 5.0–8.0)

## 2022-03-14 LAB — POC URINE PREG, ED: Preg Test, Ur: NEGATIVE

## 2022-03-14 MED ORDER — CEPHALEXIN 500 MG PO CAPS
500.0000 mg | ORAL_CAPSULE | Freq: Once | ORAL | Status: AC
  Administered 2022-03-14: 500 mg via ORAL
  Filled 2022-03-14: qty 1

## 2022-03-14 MED ORDER — CEPHALEXIN 500 MG PO CAPS
500.0000 mg | ORAL_CAPSULE | Freq: Two times a day (BID) | ORAL | 0 refills | Status: AC
  Filled 2022-03-14: qty 14, 7d supply, fill #0

## 2022-03-14 NOTE — ED Provider Triage Note (Incomplete)
Emergency Medicine Provider Triage Evaluation Note  Brenda Simmons , a 46 y.o. female  was evaluated in triage.  Pt complains of ***.  Review of Systems  Positive: *** Negative: ***  Physical Exam  Temp 99.8 F (37.7 C) (Oral)   Resp 20  Gen:   Awake, no distress  *** Resp:  Normal effort *** MSK:   Moves extremities without difficulty *** Other:  ***  Medical Decision Making  Medically screening exam initiated at 6:15 PM.  Appropriate orders placed.  Brenda Simmons was informed that the remainder of the evaluation will be completed by another provider, this initial triage assessment does not replace that evaluation, and the importance of remaining in the ED until their evaluation is complete.  ***

## 2022-03-14 NOTE — ED Provider Notes (Addendum)
[facility] Provider Note    Event Date/Time   First MD Initiated Contact with Patient 03/14/22 1839     (approximate)   History   Flank Pain and Urinary Frequency   HPI  Brenda Simmons is a 46 y.o. female presenting to the emergency department for treatment and evaluation of urinary frequency and left flank pain that radiates toward the midline and to the right side.  Symptoms started almost a week ago.  She denies fever.  She denies frequent urinary tract infections.  No history of kidney stone.  Past Medical History:  Diagnosis Date   Anxiety    Breast mass x 3 mos   felt by MD, pt does not feel it   Bulging lumbar disc    Seen Pain Clinic at [facility]   Chronic bronchitis [facility])    Depression    Seeing Mental Health Professional   Diabetes mellitus without complication ([place])    Hypertension    Migraines    Partial tear of rotator cuff    Injections did not help   Pre-diabetes    On Metformin.     Physical Exam   Triage Vital Signs: ED Triage Vitals  Enc Vitals Group     BP 03/14/22 1813 133/81     Pulse Rate 03/14/22 1813 80     Resp 03/14/22 1813 20     Temp 03/14/22 1813 99.8 F (37.7 C)     Temp Source 03/14/22 1813 Oral     SpO2 03/14/22 1813 96 %     Weight 03/14/22 1816 230 lb (104.3 kg)     Height 03/14/22 1816 '5\' 4"'$  (1.626 m)     Head Circumference --      Peak Flow --      Pain Score 03/14/22 1815 9     Pain Loc --      Pain Edu? --      Excl. in [place]? --     Most recent vital signs: Vitals:   03/14/22 1813 03/14/22 2110  BP: 133/81 (!) 143/92  Pulse: 80 73  Resp: 20 17  Temp: 99.8 F (37.7 C) 98.4 F (36.9 C)  SpO2: 96% 100%    General: Awake, no distress.  CV:  Good peripheral perfusion.  Resp:  Normal effort.  Abd:  No distention.  Other:  No CVA tenderness   ED Results / Procedures / Treatments   Labs (all labs ordered are listed, but only abnormal results are displayed) Labs Reviewed   URINALYSIS, ROUTINE W REFLEX MICROSCOPIC - Abnormal; Notable for the following components:      Result Value   Color, Urine YELLOW (*)    APPearance CLOUDY (*)    Glucose, UA 50 (*)    Hgb urine dipstick LARGE (*)    Protein, ur 30 (*)    Bacteria, UA RARE (*)    All other components within normal limits  CBC - Abnormal; Notable for the following components:   WBC 11.8 (*)    All other components within normal limits  BASIC METABOLIC PANEL - Abnormal; Notable for the following components:   Glucose, Bld 250 (*)    Calcium 8.4 (*)    Anion gap 4 (*)    All other components within normal limits  POC URINE PREG, ED     EKG  Not indicated   RADIOLOGY  CT renal stone study negative for nephrolithiasis.  Uterine fibroid noted.  I have independently reviewed and interpreted  imaging as well as reviewed report from radiology.  PROCEDURES:  Critical Care performed: No  Procedures   MEDICATIONS ORDERED IN ED:  Medications  cephALEXin (KEFLEX) capsule 500 mg (500 mg Oral Given 03/14/22 2108)     IMPRESSION / MDM / ASSESSMENT AND PLAN / ED COURSE   I reviewed the triage vital signs and the nursing notes.  Differential diagnosis includes, but is not limited to: Acute cystitis, pyelonephritis, kidney stone  Patient's presentation is most consistent with acute illness / injury with system symptoms.  46 year old female presenting to the emergency department for left flank pain and urinary symptoms as described in the HPI.  While awaiting ER room assignment, labs and urinalysis were collected.  She has a mild leukocytosis of 11.8.  Her glucose is elevated at 250 and she has a negative pregnancy test.  Urinalysis shows rare bacteria, 30 protein, large amount of hemoglobin, glucose.  Results discussed with the patient who tells me that she is currently on her menstrual cycle and has been for the past month.  Is unclear whether the hemoglobin is coming from menstrual blood or  potentially kidney stone and since the patient has flank pain, we discussed CT renal stone study.  Patient would like to proceed with the CT. Clinical Course as of 03/14/22 2326  Nancy Fetter Mar 14, 2022  2058 CT negative for nephrolithiasis or ureterolithiasis.  She does have a uterine fibroid noted which is likely the cause of her prolonged menstrual cycles.  Results were discussed with the patient and she will discuss this with her OB/GYN.  Plan at this time will be to treat her with Keflex for acute cystitis based on rare bacteria in her urine, white blood cell count of 11.8 and symptoms.  She is to follow-up with her primary care provider if not improving over the next 2 to 3 days.  She was encouraged to return to the emergency department for symptoms of change or worsen if she is unable to schedule an appointment. [CT]    Clinical Course User Index [CT] ,  B, FNP     FINAL CLINICAL IMPRESSION(S) / ED DIAGNOSES   Final diagnoses:  Flank pain  Acute cystitis with hematuria     Rx / DC Orders   ED Discharge Orders          Ordered    cephALEXin (KEFLEX) 500 MG capsule  2 times daily        03/14/22 2057             Note:  This document was prepared using Dragon voice recognition software and may include unintentional dictation errors.   Victorino Dike, FNP 03/14/22 2100    Victorino Dike, FNP 03/14/22 2148    Naaman Plummer, MD 03/14/22 2320    Victorino Dike, FNP 03/14/22 2326    Naaman Plummer, MD 03/18/22 2321

## 2022-03-14 NOTE — ED Triage Notes (Addendum)
Pt arrived via POV with reports of urinary frequency since Monday, pt states she also is having low back pain starting in the L side radiating to the R.   Pt does report some dysuria at times.

## 2022-03-15 ENCOUNTER — Other Ambulatory Visit: Payer: Self-pay

## 2022-03-19 ENCOUNTER — Other Ambulatory Visit: Payer: Self-pay

## 2022-03-19 MED ORDER — DIAZEPAM 5 MG PO TABS
ORAL_TABLET | ORAL | 0 refills | Status: DC
  Filled 2022-03-19: qty 1, 1d supply, fill #0

## 2022-04-01 ENCOUNTER — Other Ambulatory Visit: Payer: Self-pay

## 2022-05-05 ENCOUNTER — Other Ambulatory Visit: Payer: Self-pay

## 2022-10-17 DIAGNOSIS — Z79899 Other long term (current) drug therapy: Secondary | ICD-10-CM | POA: Insufficient documentation

## 2022-10-17 DIAGNOSIS — M5030 Other cervical disc degeneration, unspecified cervical region: Secondary | ICD-10-CM | POA: Insufficient documentation

## 2022-10-17 DIAGNOSIS — R937 Abnormal findings on diagnostic imaging of other parts of musculoskeletal system: Secondary | ICD-10-CM | POA: Insufficient documentation

## 2022-10-17 DIAGNOSIS — M899 Disorder of bone, unspecified: Secondary | ICD-10-CM | POA: Insufficient documentation

## 2022-10-17 DIAGNOSIS — G894 Chronic pain syndrome: Secondary | ICD-10-CM | POA: Insufficient documentation

## 2022-10-17 DIAGNOSIS — Z789 Other specified health status: Secondary | ICD-10-CM | POA: Insufficient documentation

## 2022-10-17 NOTE — Progress Notes (Signed)
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 Pain) (Bilateral) (L>R)   8. History of ankle surgery old (2006) (Left)   9. Numbness of lower extremity (Intermittent) (Bilateral)   10. Pharmacologic therapy   11. Disorder of skeletal system   12. Problems influencing health status    Plan of Care (Initial workup plan)  Note: Brenda Simmons was reminded that as per protocol, today's visit has been an evaluation only. We have not taken over the patient's controlled substance management.  Problem-specific plan: No problem-specific Assessment & Plan notes found for this encounter.  Lab Orders         Compliance Drug Analysis, Ur         Comp. Metabolic Panel (12)  Magnesium         Vitamin B12         Sedimentation rate         25-Hydroxy vitamin D Lcms D2+D3         C-reactive protein     Imaging Orders         DG Cervical Spine With Flex &  Extend         DG Lumbar Spine Complete W/Bend         DG HIP UNILAT W OR W/O PELVIS 2-3 VIEWS RIGHT         DG HIP UNILAT W OR W/O PELVIS 2-3 VIEWS LEFT         DG Knee Complete 4 Views Right         DG Knee Complete 4 Views Left     Referral Orders         Ambulatory referral to Physical Therapy     Procedure Orders    No procedure(s) ordered today   Pharmacotherapy (current): Medications ordered:  No orders of the defined types were placed in this encounter.  Medications administered during this visit: Brenda Simmons had no medications administered during this visit.   Analgesic Pharmacotherapy:  Opioid Analgesics: For patients currently taking or requesting to take opioid analgesics, in accordance with Brenda, we will assess their risks and indications for the use of these substances. After completing our evaluation, we may offer recommendations, but we no longer take patients for medication management. The prescribing physician will ultimately decide, based on his/her training and level of comfort whether to adopt any of the recommendations, including whether or not to prescribe such medicines.  Membrane stabilizer: To be determined at a later time  Muscle relaxant: To be determined at a later time  NSAID: To be determined at a later time  Other analgesic(s): To be determined at a later time   Interventional management options: Brenda Simmons was informed that there is no guarantee that she would be a candidate for interventional therapies. The decision will be based on the results of diagnostic studies, as well as Brenda Simmons's risk profile.  Procedure(s) under consideration:  Pending results of ordered studies      Interventional Therapies  Risk Factors  Considerations:     Planned  Pending:   See above for possible orders   Under consideration:   Pending completion of evaluation   Completed:   None at this time   Completed by  other providers:   None at this time   Therapeutic  Palliative (PRN) options:   None established    Provider-requested follow-up: Return in about 2 weeks (around 11/01/2022) for (62mn), Eval-day (M,W), (F2F), 2nd Visit, for review of ordered tests.  Future Appointments  Date Time Provider DBandon 11/15/2022  1:00 PM [facility]ilinda Pointer [facility]     Duration of encounter: 45 minutes.  Total time on encounter, as per AMA guidelines included both the face-to-face and non-face-to-face time personally spent by the physician and/or other qualified health care professional(s) on the day of the encounter (includes time in activities that require the physician or other qualified health care professional and does not include time in activities normally performed by clinical staff). Physician's time may include the following activities when performed: Preparing to see the patient (e.g., pre-charting review of records, searching for previously ordered imaging, lab work, and nerve conduction tests) Review of prior analgesic pharmacotherapies. Reviewing PMP  Interpreting ordered tests (e.g., lab work, imaging, nerve conduction tests) Performing post-procedure evaluations, including interpretation of diagnostic procedures Obtaining and/or reviewing separately obtained history Performing a medically appropriate examination and/or evaluation Counseling and educating the patient/family/caregiver Ordering medications, tests, or procedures Referring and communicating with other health care professionals (when not separately reported) Documenting clinical information in the electronic or other health record Independently interpreting results (not separately reported) and communicating results to the patient/ family/caregiver Care coordination (not separately reported)  Note by: Gaspar Cola, MD Date: 10/18/2022; Time: 9:49 AM

## 2022-10-17 NOTE — Patient Instructions (Addendum)
____________________________________________________________________________________________  New Patients  Welcome to [facility].   Initial Visit The first or initial visit consists of an evaluation only.   Interventional pain management.  We offer therapies other than opioid controlled substances to manage chronic pain. These include, but are not limited to, diagnostic, therapeutic, and palliative specialized injection therapies (i.e.: Epidural Steroids, Facet Blocks, etc.). We specialize in a variety of nerve blocks as well as radiofrequency treatments. We offer pain implant evaluations and trials, as well as follow up management. In addition we also provide a variety joint injections, including Viscosupplementation (AKA: Gel Therapy).  Prescription Pain Medication We provide evaluations for/of pharmacologic therapies. Recommendations will follow CDC Guidelines.  We no longer take patients for long-term medication management. We will not be taking over your pain medications.  ____________________________________________________________________________________________    ____________________________________________________________________________________________  Patient Information update  To: All of our patients.  Re: Name change.  It has been made official that our current name, "[facility]"   will soon be changed to "[facility]".   The purpose of this change is to eliminate any confusion created by the concept of our practice being a "Medication Management Pain Clinic". In the past this has led to the misconception that we treat pain primarily by the use of prescription medications.  Nothing can be farther from the truth.   Understanding PAIN MANAGEMENT: To further understand what our practice does, you  first have to understand that "Pain Management" is a subspecialty that requires additional training once a physician has completed their specialty training, which can be in either Anesthesia, Neurology, Psychiatry, or Physical Medicine and Rehabilitation (PMR). Each one of these contributes to the final approach taken by each physician to the management of their patient's pain. To be a "Pain Management Specialist" you must have first completed one of the specialty trainings below.  Anesthesiologists - trained in clinical pharmacology and interventional techniques such as nerve blockade and regional as well as central neuroanatomy. They are trained to block pain before, during, and after surgical interventions.  Neurologists - trained in the diagnosis and pharmacological treatment of complex neurological conditions, such as Multiple Sclerosis, Parkinson's, spinal cord injuries, and other systemic conditions that may be associated with symptoms that may include but are not limited to pain. They tend to rely primarily on the treatment of chronic pain using prescription medications.  Psychiatrist - trained in conditions affecting the psychosocial wellbeing of patients including but not limited to depression, anxiety, schizophrenia, personality disorders, addiction, and other substance use disorders that may be associated with chronic pain. They tend to rely primarily on the treatment of chronic pain using prescription medications.   Physical Medicine and Rehabilitation (PMR) physicians, also known as physiatrists - trained to treat a wide variety of medical conditions affecting the brain, spinal cord, nerves, bones, joints, ligaments, muscles, and tendons. Their training is primarily aimed at treating patients that have suffered injuries that have caused severe physical impairment. Their training is primarily aimed at the physical therapy and rehabilitation of those patients. They may also work alongside  orthopedic surgeons or neurosurgeons using their expertise in assisting surgical patients to recover after their surgeries.  INTERVENTIONAL PAIN MANAGEMENT is sub-subspecialty of Pain Management.  Our physicians are Board-certified in Anesthesia, Pain Management, and Interventional Pain Management.  This meaning that not only have they been trained and Board-certified in their specialty of Anesthesia, and  subspecialty of Pain Management, but they have also received further training in the sub-subspecialty of Interventional Pain Management, in order to become Board-certified as INTERVENTIONAL PAIN MANAGEMENT SPECIALIST.    Mission: Our goal is to use our skills in  [place] as alternatives to the chronic use of prescription opioid medications for the treatment of pain. To make this more clear, we have changed our name to reflect what we do and offer. We will continue to offer medication management assessment and recommendations, but we will not be taking over any patient's medication management.  ____________________________________________________________________________________________     ______________________________________________________________________  Preparing for your procedure  During your procedure appointment there will be: No Prescription Refills. No disability issues to discussed. No medication changes or discussions.  Instructions: Food intake: Avoid eating anything solid for at least 8 hours prior to your procedure. Clear liquid intake: You may take clear liquids such as water up to 2 hours prior to your procedure. (No carbonated drinks. No soda.) Transportation: Unless otherwise stated by your physician, bring a driver. Morning Medicines: Except for blood thinners, take all of your other morning medications with a sip of water. Make sure to take your heart and blood pressure medicines. If your blood pressure's lower number is above 100, the case will  be rescheduled. Blood thinners: Make sure to stop your blood thinners as instructed.  If you take a blood thinner, but were not instructed to stop it, call our office [phone] and ask to talk to a nurse. Not stopping a blood thinner prior to certain procedures could lead to serious complications. Diabetics on insulin: Notify the staff so that you can be scheduled 1st case in the morning. If your diabetes requires high dose insulin, take only  of your normal insulin dose the morning of the procedure and notify the staff that you have done so. Preventing infections: Shower with an antibacterial soap the morning of your procedure.  Build-up your immune system: Take 1000 mg of Vitamin C with every meal (3 times a day) the day prior to your procedure. Antibiotics: Inform the nursing staff if you are taking any antibiotics or if you have any conditions that may require antibiotics prior to procedures. (Example: recent joint implants)   Pregnancy: If you are pregnant make sure to notify the nursing staff. Not doing so may result in injury to the fetus, including death.  Sickness: If you have a cold, fever, or any active infections, call and cancel or reschedule your procedure. Receiving steroids while having an infection may result in complications. Arrival: You must be in the facility at least 30 minutes prior to your scheduled procedure. Tardiness: Your scheduled time is also the cutoff time. If you do not arrive at least 15 minutes prior to your procedure, you will be rescheduled.  Children: Do not bring any children with you. Make arrangements to keep them home. Dress appropriately: There is always a possibility that your clothing may get soiled. Avoid long dresses. Valuables: Do not bring any jewelry or valuables.  Reasons to call and reschedule or cancel your procedure: (Following these recommendations will minimize the risk of a serious complication.) Surgeries: Avoid having procedures within 2  weeks of any surgery. (Avoid for 2 weeks before or after any surgery). Flu Shots: Avoid having procedures within 2 weeks of a flu shots or . (Avoid for 2 weeks before or after immunizations). Barium: Avoid having a procedure within 7-10 days after having had a radiological study involving the  use of radiological contrast. (Myelograms, Barium swallow or enema study). Heart attacks: Avoid any elective procedures or surgeries for the initial 6 months after a "Myocardial Infarction" (Heart Attack). Blood thinners: It is imperative that you stop these medications before procedures. Let us know if you if you take any blood thinner.  Infection: Avoid procedures during or within two weeks of an infection (including chest colds or gastrointestinal problems). Symptoms associated with infections include: Localized redness, fever, chills, night sweats or profuse sweating, burning sensation when voiding, cough, congestion, stuffiness, runny nose, sore throat, diarrhea, nausea, vomiting, cold or Flu symptoms, recent or current infections. It is specially important if the infection is over the area that we intend to treat. Heart and lung problems: Symptoms that may suggest an active cardiopulmonary problem include: cough, chest pain, breathing difficulties or shortness of breath, dizziness, ankle swelling, uncontrolled high or unusually low blood pressure, and/or palpitations. If you are experiencing any of these symptoms, cancel your procedure and contact your primary care physician for an evaluation.  Remember:  Regular Business hours are:  Monday to Thursday 8:00 AM to 4:00 PM  Provider's Schedule: Milinda Pointer, MD:  Procedure days: Tuesday and Thursday 7:30 AM to 4:00 PM  Gillis Santa, MD:  Procedure days: Monday and Wednesday 7:30 AM to 4:00 PM  ______________________________________________________________________     ____________________________________________________________________________________________  General Risks and Possible Complications  Patient Responsibilities: It is important that you read this as it is part of your informed consent. It is our duty to inform you of the risks and possible complications associated with treatments offered to you. It is your responsibility as a patient to read this and to ask questions about anything that is not clear or that you believe was not covered in this document.  Patient's Rights: You have the right to refuse treatment. You also have the right to change your mind, even after initially having agreed to have the treatment done. However, under this last option, if you wait until the last second to change your mind, you may be charged for the materials used up to that point.  Introduction: Medicine is not an Chief Strategy Officer. Everything in Medicine, including the lack of treatment(s), carries the potential for danger, harm, or loss (which is by definition: Risk). In Medicine, a complication is a secondary problem, condition, or disease that can aggravate an already existing one. All treatments carry the risk of possible complications. The fact that a side effects or complications occurs, does not imply that the treatment was conducted incorrectly. It must be clearly understood that these can happen even when everything is done following the highest safety standards.  No treatment: You can choose not to proceed with the proposed treatment alternative. The "PRO(s)" would include: avoiding the risk of complications associated with the therapy. The "CON(s)" would include: not getting any of the treatment benefits. These benefits fall under one of three categories: diagnostic; therapeutic; and/or palliative. Diagnostic benefits include: getting information which can ultimately lead to improvement of the disease or symptom(s). Therapeutic benefits are those associated with  the successful treatment of the disease. Finally, palliative benefits are those related to the decrease of the primary symptoms, without necessarily curing the condition (example: decreasing the pain from a flare-up of a chronic condition, such as incurable terminal cancer).  General Risks and Complications: These are associated to most interventional treatments. They can occur alone, or in combination. They fall under one of the following six (6) categories: no benefit or worsening of  symptoms; bleeding; infection; nerve damage; allergic reactions; and/or death. No benefits or worsening of symptoms: In Medicine there are no guarantees, only probabilities. No healthcare provider can ever guarantee that a medical treatment will work, they can only state the probability that it may. Furthermore, there is always the possibility that the condition may worsen, either directly, or indirectly, as a consequence of the treatment. Bleeding: This is more common if the patient is taking a blood thinner, either prescription or over the counter (example: Goody Powders, Fish oil, Aspirin, Garlic, etc.), or if suffering a condition associated with impaired coagulation (example: Hemophilia, cirrhosis of the liver, low platelet counts, etc.). However, even if you do not have one on these, it can still happen. If you have any of these conditions, or take one of these drugs, make sure to notify your treating physician. Infection: This is more common in patients with a compromised immune system, either due to disease (example: diabetes, cancer, human immunodeficiency virus [HIV], etc.), or due to medications or treatments (example: therapies used to treat cancer and rheumatological diseases). However, even if you do not have one on these, it can still happen. If you have any of these conditions, or take one of these drugs, make sure to notify your treating physician. Nerve Damage: This is more common when the treatment is an  invasive one, but it can also happen with the use of medications, such as those used in the treatment of cancer. The damage can occur to small secondary nerves, or to large primary ones, such as those in the spinal cord and brain. This damage may be temporary or permanent and it may lead to impairments that can range from temporary numbness to permanent paralysis and/or brain death. Allergic Reactions: Any time a substance or material comes in contact with our body, there is the possibility of an allergic reaction. These can range from a mild skin rash (contact dermatitis) to a severe systemic reaction (anaphylactic reaction), which can result in death. Death: In general, any medical intervention can result in death, most of the time due to an unforeseen complication. ____________________________________________________________________________________________

## 2022-10-18 ENCOUNTER — Ambulatory Visit (HOSPITAL_BASED_OUTPATIENT_CLINIC_OR_DEPARTMENT_OTHER): Payer: Medicaid Other | Admitting: Pain Medicine

## 2022-10-18 ENCOUNTER — Encounter: Payer: Self-pay | Admitting: Pain Medicine

## 2022-10-18 ENCOUNTER — Ambulatory Visit
Admission: RE | Admit: 2022-10-18 | Discharge: 2022-10-18 | Disposition: A | Discharge status: Home / Self Care | Payer: Medicaid Other | Source: Ambulatory Visit | Attending: Pain Medicine | Admitting: Pain Medicine

## 2022-10-18 ENCOUNTER — Ambulatory Visit
Admission: RE | Admit: 2022-10-18 | Discharge: 2022-10-18 | Disposition: A | Discharge status: Home / Self Care | Payer: Medicaid Other | Attending: Pain Medicine | Admitting: Pain Medicine

## 2022-10-18 VITALS — BP 127/82 | HR 88 | Temp 96.8°F | Ht 64.0 in | Wt 230.0 lb

## 2022-10-18 DIAGNOSIS — M79605 Pain in left leg: Secondary | ICD-10-CM | POA: Insufficient documentation

## 2022-10-18 DIAGNOSIS — M25562 Pain in left knee: Secondary | ICD-10-CM | POA: Insufficient documentation

## 2022-10-18 DIAGNOSIS — M79604 Pain in right leg: Secondary | ICD-10-CM | POA: Insufficient documentation

## 2022-10-18 DIAGNOSIS — M899 Disorder of bone, unspecified: Secondary | ICD-10-CM | POA: Insufficient documentation

## 2022-10-18 DIAGNOSIS — G8929 Other chronic pain: Secondary | ICD-10-CM

## 2022-10-18 DIAGNOSIS — M25552 Pain in left hip: Secondary | ICD-10-CM | POA: Insufficient documentation

## 2022-10-18 DIAGNOSIS — G894 Chronic pain syndrome: Secondary | ICD-10-CM

## 2022-10-18 DIAGNOSIS — M25551 Pain in right hip: Secondary | ICD-10-CM | POA: Insufficient documentation

## 2022-10-18 DIAGNOSIS — Z789 Other specified health status: Secondary | ICD-10-CM

## 2022-10-18 DIAGNOSIS — M545 Low back pain, unspecified: Secondary | ICD-10-CM

## 2022-10-18 DIAGNOSIS — R200 Anesthesia of skin: Secondary | ICD-10-CM

## 2022-10-18 DIAGNOSIS — M5030 Other cervical disc degeneration, unspecified cervical region: Secondary | ICD-10-CM | POA: Insufficient documentation

## 2022-10-18 DIAGNOSIS — Z9889 Other specified postprocedural states: Secondary | ICD-10-CM | POA: Insufficient documentation

## 2022-10-18 DIAGNOSIS — M25561 Pain in right knee: Secondary | ICD-10-CM | POA: Insufficient documentation

## 2022-10-18 DIAGNOSIS — Z79899 Other long term (current) drug therapy: Secondary | ICD-10-CM | POA: Insufficient documentation

## 2022-10-18 DIAGNOSIS — M542 Cervicalgia: Secondary | ICD-10-CM | POA: Insufficient documentation

## 2022-10-18 NOTE — Progress Notes (Signed)
Safety precautions to be maintained throughout the outpatient stay will include: orient to surroundings, keep bed in low position, maintain call bell within reach at all times, provide assistance with transfer out of bed and ambulation.  

## 2022-10-20 LAB — COMPLIANCE DRUG ANALYSIS, UR

## 2022-10-22 LAB — COMP. METABOLIC PANEL (12)
AST: 14 IU/L (ref 0–40)
Albumin/Globulin Ratio: 1.7 (ref 1.2–2.2)
Albumin: 3.8 g/dL — ABNORMAL LOW (ref 3.9–4.9)
Alkaline Phosphatase: 114 IU/L (ref 44–121)
BUN/Creatinine Ratio: 8 — ABNORMAL LOW (ref 9–23)
BUN: 7 mg/dL (ref 6–24)
Bilirubin Total: <0.2 mg/dL (ref 0.0–1.2)
Calcium: 8.9 mg/dL (ref 8.7–10.2)
Chloride: 106 mmol/L (ref 96–106)
Creatinine, Ser: 0.86 mg/dL (ref 0.57–1.00)
Globulin, Total: 2.2 g/dL (ref 1.5–4.5)
Glucose: 290 mg/dL — ABNORMAL HIGH (ref 70–99)
Potassium: 4.6 mmol/L (ref 3.5–5.2)
Sodium: 143 mmol/L (ref 134–144)
Total Protein: 6 g/dL (ref 6.0–8.5)
eGFR: 84 mL/min/{1.73_m2} (ref >59)

## 2022-10-22 LAB — VITAMIN B12: Vitamin B-12: 277 pg/mL (ref 232–1245)

## 2022-10-22 LAB — C-REACTIVE PROTEIN: CRP: 14 mg/L — ABNORMAL HIGH (ref 0–10)

## 2022-10-22 LAB — SEDIMENTATION RATE: Sed Rate: 7 mm/hr (ref 0–32)

## 2022-10-22 LAB — 25-HYDROXY VITAMIN D LCMS D2+D3
25-Hydroxy, Vitamin D-2: <1 ng/mL
25-Hydroxy, Vitamin D-3: 9.1 ng/mL
25-Hydroxy, Vitamin D: 9.2 ng/mL — ABNORMAL LOW

## 2022-10-22 LAB — MAGNESIUM: Magnesium: 1.7 mg/dL (ref 1.6–2.3)

## 2022-10-22 NOTE — Progress Notes (Signed)
10-22-2022 Received a request for medical records on patient Brenda Simmons, DOB-[id number] for all [facility] records from 09-28-2003 to present. Per review request is not addressed specifically to the [facility]. Telephone call to patient Brenda Simmons and requested she come into the agency to sign an agency release form. Patient agreed and will come Monday, 10-25-2022 at 3:30pm. Inetta Fermo RN.

## 2022-10-27 NOTE — Progress Notes (Signed)
10-27-2022 Telephone to patient, Brenda Simmons, DOB-July 20, 1976. Patient unable to come to agency to sign ROI on 10-20-2022 due to car problems.Patient is able to come today. Advised to bring drivers license for ID verification. Discussed medical record request from [employer]. Patient does not want any HIV test results or STD test results or clinical visits released. Patient to come onsite today and sign  [facility]. ROI allowing release of medical records. Inetta Fermo RN.

## 2022-11-11 NOTE — Progress Notes (Signed)
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    FLUoxetine (PROZAC) 20 MG capsule, Take 20 mg by mouth daily., Disp: , Rfl:    fluticasone (FLONASE) 50 MCG/ACT nasal spray, Place 2 sprays into both nostrils once daily for allergies., Disp: 16 g, Rfl: 11   glucose blood (RIGHTEST GS550 BLOOD GLUCOSE) test strip, Use as directed, Disp: 100 each, Rfl: 11   ibuprofen (ADVIL,MOTRIN) 600 MG tablet, Take 600 mg by mouth every 6 (six) hours as needed for cramping., Disp: , Rfl:    montelukast (SINGULAIR) 10 MG tablet, TAKE 1 TABLET BY MOUTH ONCE DAILY FOR ASTHMA AND ALLERGIES., Disp: 90 tablet, Rfl: 3   Multiple Vitamin (MULTIVITAMIN WITH MINERALS) TABS tablet, Take 1 tablet by mouth daily., Disp: , Rfl:    omeprazole (PRILOSEC) 20 MG capsule, TAKE 1 CAPSULE BY MOUTH ONCE A DAY FOR REFLUX AND COUGH., Disp: 90  capsule, Rfl: 3   ondansetron (ZOFRAN-ODT) 4 MG disintegrating tablet, DISSOLVE 2 TABLETS (8  MG) BY MOUTH ONCE EVERY 8 HOURS AS NEEDED FOR NAUSEA, Disp: 20 tablet, Rfl: 11   propranolol ER (INDERAL LA) 120 MG 24 hr capsule, TAKE 1 CAPSULE BY MOUTH EVERY DAY AT BEDTIME., Disp: 90 capsule, Rfl: 3   propranolol ER (INDERAL LA) 60 MG 24 hr capsule, Take 2 capsules (19m total) by mouth once daily at bedtime., Disp: 180 capsule, Rfl: 3   QUEtiapine (SEROQUEL) 100 MG tablet, Take 100 mg by mouth at bedtime., Disp: , Rfl:    Rightest GL300 Lancets MISC, USE AS DIRECTED, Disp: 100 each, Rfl: 0   sitaGLIPtin-metformin (JANUMET) 50-500 MG tablet, Take 1 tablet by mouth 2 (two) times daily with a meal., Disp: , Rfl:    gabapentin (NEURONTIN) 300 MG capsule, Take 1 capsule (300 mg total) by mouth 2 (two) times daily for 30 days. (Patient taking differently: Take 300 mg by mouth daily as needed (pain). ), Disp: 60 capsule, Rfl: 0  ROS  Constitutional: Denies any fever or chills Gastrointestinal: No reported hemesis, hematochezia, vomiting, or acute GI distress Musculoskeletal: Denies any acute onset joint swelling, redness, loss of ROM, or weakness Neurological: No reported episodes of acute onset apraxia, aphasia, dysarthria, agnosia, amnesia, paralysis, loss of coordination, or loss of consciousness  Allergies  Ms. Liggett has No Known Allergies.  PFSH  Drug: Ms. CBadura reports no history of drug use. Alcohol:  reports current alcohol use. Tobacco:  reports that she has been smoking cigarettes. She has a 3.75 pack-year smoking history. She has never used smokeless tobacco. Medical:  has a past medical history of Anxiety, Breast mass (x 3 mos), Bulging lumbar disc, Chronic bronchitis ([place], Depression, Diabetes mellitus without complication ([place], Hypertension, Migraines, Partial tear of rotator cuff, and Pre-diabetes. Surgical: Ms. CGreely has a past surgical history that includes Other surgical  history (Left, ankle surgery); Salpingectomy (Bilateral); Ankle surgery (Left); Breast cyst aspiration (Right, age 47; and Tubal ligation (Bilateral). Family: family history includes Cancer in her maternal grandmother and paternal grandmother; Depression in her brother and mother; Diabetes in her mother; Hypertension in her father, mother, and sister.  Constitutional Exam  General appearance: Well nourished, well developed, and well hydrated. In no apparent acute distress Vitals:   11/15/22 1245  BP: 135/76  Pulse: 72  Resp: 18  Temp: (!) 97.3 F (36.3 C)  TempSrc: Temporal  SpO2: 100%  Weight: 230 lb (104.3 kg)  Height: 5' 1"$  (1.549 m)   BMI Assessment: Estimated body mass index is 43.46 kg/m as calculated from the following:   Height as of this encounter: 5' 1"$  (1.549 m).   Weight as of this encounter: 230 lb (104.3 kg).  BMI interpretation table: BMI level Category Range association with higher incidence of chronic pain  <18 kg/m2 Underweight   18.5-24.9 kg/m2 Ideal body weight   25-29.9 kg/m2 Overweight Increased incidence by 20%  30-34.9 kg/m2 Obese (Class I) Increased incidence by 68%  35-39.9 kg/m2 Severe obesity (Class II) Increased incidence by 136%  >40 kg/m2 Extreme obesity (Class III) Increased incidence by 254%   Patient's current BMI Ideal Body weight  Body mass index is 43.46 kg/m. Ideal body weight: 47.8 kg (105 lb 6.1 oz) Adjusted ideal body weight: 70.4 kg (155 lb 3.6 oz)   BMI Readings from Last 4 Encounters:  11/15/22 43.46 kg/m  10/18/22 39.48 kg/m  03/14/22 39.48 kg/m  01/24/22 36.90 kg/m   Wt Readings from Last 4 Encounters:  11/15/22 230 lb (104.3 kg)  10/18/22 230 lb (  104.3 kg)  03/14/22 230 lb (104.3 kg)  01/24/22 214 lb 15.2 oz (97.5 kg)    Psych/Mental status: Alert, oriented x 3 (person, place, & time)       Eyes: PERLA Respiratory: No evidence of acute respiratory distress  Assessment & Plan  Primary Diagnosis & Pertinent  Problem List: The primary encounter diagnosis was Chronic low back pain (1ry area of Pain) (Bilateral) (R>L) w/o sciatica. Diagnoses of DDD (degenerative disc disease), lumbosacral, Lumbar facet joint arthropathy (L4-5), Grade 1 (5 mm) anterolisthesis of lumbar spine (L4/L5), Cervical facet hypertrophy (Multilevel) (Bilateral), Lumbosacral facet joint syndrome, Chronic neck pain (2ry area of Pain) (Bilateral) (R>L), Cervical foraminal stenosis  (Right: C3-4) (Bilateral: C4-5, C5-6) (Left: C6-7), Chronic hip pain (3ry area of Pain) (Bilateral) (R>L), Chronic knee pain (4th area of Pain) (Bilateral) (R>L), Osteoarthritis of knee (Right), Osteoarthritis of hips (Bilateral), Osteoarthritis of sacroiliac joints (Bilateral) ([place]), and Elevated blood sugar level were also pertinent to this visit.  Visit Diagnosis: 1. Chronic low back pain (1ry area of Pain) (Bilateral) (R>L) w/o sciatica   2. DDD (degenerative disc disease), lumbosacral   3. Lumbar facet joint arthropathy (L4-5)   4. Grade 1 (5 mm) anterolisthesis of lumbar spine (L4/L5)   5. Cervical facet hypertrophy (Multilevel) (Bilateral)   6. Lumbosacral facet joint syndrome   7. Chronic neck pain (2ry area of Pain) (Bilateral) (R>L)   8. Cervical foraminal stenosis  (Right: C3-4) (Bilateral: C4-5, C5-6) (Left: C6-7)   9. Chronic hip pain (3ry area of Pain) (Bilateral) (R>L)   10. Chronic knee pain (4th area of Pain) (Bilateral) (R>L)   11. Osteoarthritis of knee (Right)   12. Osteoarthritis of hips (Bilateral)   13. Osteoarthritis of sacroiliac joints (Bilateral) (HCC)   14. Elevated blood sugar level    Problems updated and reviewed during this visit: Problem  Ddd (Degenerative Disc Disease), Lumbosacral  Osteoarthritis of knee (Right)  Osteoarthritis of hips (Bilateral)   IMPRESSION: 1. Mild bilateral femoroacetabular osteoarthritis.   Osteoarthritis of sacroiliac joints (Bilateral) (HCC)   Mild bilateral sacroiliac joint subchondral  sclerosis.   Cervical facet hypertrophy (Multilevel) (Bilateral)  Cervical foraminal stenosis  (Right: C3-4) (Bilateral: C4-5, C5-6) (Left: C6-7)   Uncovertebral and facet joint hypertrophy contribute to mild right C3-4, mild-to-moderate right C4-5, moderate to severe right C5-6, mild left C4-5, moderate left C5-6, and mild-to-moderate left C6-7 neuroforaminal stenosis. (Right: C3-4) (Left: C6-7) (Bilateral: C4-5, C5-6)   Grade 1 (5 mm) anterolisthesis of lumbar spine (L4/L5)  Lumbar facet joint arthropathy (L4-5)  Lumbosacral Facet Joint Syndrome  DDD (degenerative disc disease), cervical (C5-C7)   Degenerative disc disease of the C5-6 and C6-7 levels    Elevated Blood Sugar Level    Plan of Care  Pharmacotherapy (Medications Ordered): No orders of the defined types were placed in this encounter.  Procedure Orders         LUMBAR FACET(MEDIAL BRANCH NERVE BLOCK) MBNB     Lab Orders         Hemoglobin A1c     Imaging Orders  No imaging studies ordered today   Referral Orders  No referral(s) requested today    Pharmacological management:  Opioid Analgesics: I will not be prescribing any opioids at this time Membrane stabilizer: I will not be prescribing any at this time Muscle relaxant: I will not be prescribing any at this time NSAID: I will not be prescribing any at this time Other analgesic(s): I will not be prescribing any at this time  Interventional Therapies  Risk Factors  Considerations:  MO  [id number]  HTN  Anxiety   Planned  Pending:   Diagnostic bilateral lumbar facet MBB #1    Under consideration:   Diagnostic bilateral lumbar facet MBB #1  Diagnostic bilateral SI joint block #1  Diagnostic bilateral IA hip joint injection #1  Diagnostic bilateral IA knee joint injection #1  Diagnostic bilateral cervical facet MBB #1    Completed:   None at this time   Completed by other providers:   None at this time   Therapeutic  Palliative (PRN)  options:   None established       Provider-requested follow-up: Return for (ECT): (B) L-FCT Blk #1. Recent Visits Date Type Provider Dept  10/18/22 Office Visit Milinda Pointer, MD [facility]  Showing recent visits within past 90 days and meeting all other requirements Today's Visits Date Type Provider Dept  11/15/22 Office Visit Milinda Pointer, MD [facility]  Showing today's visits and meeting all other requirements Future Appointments No visits were found meeting these conditions. Showing future appointments within next 90 days and meeting all other requirements   Primary Care Physician: [facility] [place]  Duration of encounter: 74 minutes.  Total time on encounter, as per AMA guidelines included both the face-to-face and non-face-to-face time personally spent by the physician and/or other qualified health care professional(s) on the day of the encounter (includes time in activities that require the physician or other qualified health care professional and does not include time in activities normally performed by clinical staff). Physician's time may include the following activities when performed: Preparing to see the patient (e.g., pre-charting review of records, searching for previously ordered imaging, lab work, and nerve conduction tests) Review of prior analgesic pharmacotherapies. Reviewing PMP Interpreting ordered tests (e.g., lab work, imaging, nerve conduction tests) Performing post-procedure evaluations, including interpretation of diagnostic procedures Obtaining and/or reviewing separately obtained history Performing a medically appropriate examination and/or evaluation Counseling and educating the patient/family/caregiver Ordering medications, tests, or procedures Referring and communicating with other health care professionals (when not separately reported) Documenting clinical information in the electronic or other  health record Independently interpreting results (not separately reported) and communicating results to the patient/ family/caregiver Care coordination (not separately reported)  Note by: Gaspar Cola, MD (TTS technology used. I apologize for any typographical errors that were not detected and corrected.) Date: 11/15/2022; Time: 1:40 PM

## 2022-11-14 NOTE — Patient Instructions (Incomplete)
____________________________________________________________________________________________  New Patients  Welcome to [facility].   Initial Visit The first or initial visit consists of an evaluation only.   Interventional pain management.  We offer therapies other than opioid controlled substances to manage chronic pain. These include, but are not limited to, diagnostic, therapeutic, and palliative specialized injection therapies (i.e.: Epidural Steroids, Facet Blocks, etc.). We specialize in a variety of nerve blocks as well as radiofrequency treatments. We offer pain implant evaluations and trials, as well as follow up management. In addition we also provide a variety joint injections, including Viscosupplementation (AKA: Gel Therapy).  Prescription Pain Medication We provide evaluations for/of pharmacologic therapies. Recommendations will follow CDC Guidelines.  We no longer take patients for long-term medication management. We will not be taking over your pain medications.  ____________________________________________________________________________________________    ____________________________________________________________________________________________  Patient Information update  To: All of our patients.  Re: Name change.  It has been made official that our current name, "[facility]"   will soon be changed to "[facility]".   The purpose of this change is to eliminate any confusion created by the concept of our practice being a "Medication Management Pain Clinic". In the past this has led to the misconception that we treat pain primarily by the use of prescription medications.  Nothing can be farther from the truth.   Understanding PAIN MANAGEMENT: To further understand what our practice does, you  first have to understand that "Pain Management" is a subspecialty that requires additional training once a physician has completed their specialty training, which can be in either Anesthesia, Neurology, Psychiatry, or Physical Medicine and Rehabilitation (PMR). Each one of these contributes to the final approach taken by each physician to the management of their patient's pain. To be a "Pain Management Specialist" you must have first completed one of the specialty trainings below.  Anesthesiologists - trained in clinical pharmacology and interventional techniques such as nerve blockade and regional as well as central neuroanatomy. They are trained to block pain before, during, and after surgical interventions.  Neurologists - trained in the diagnosis and pharmacological treatment of complex neurological conditions, such as Multiple Sclerosis, Parkinson's, spinal cord injuries, and other systemic conditions that may be associated with symptoms that may include but are not limited to pain. They tend to rely primarily on the treatment of chronic pain using prescription medications.  Psychiatrist - trained in conditions affecting the psychosocial wellbeing of patients including but not limited to depression, anxiety, schizophrenia, personality disorders, addiction, and other substance use disorders that may be associated with chronic pain. They tend to rely primarily on the treatment of chronic pain using prescription medications.   Physical Medicine and Rehabilitation (PMR) physicians, also known as physiatrists - trained to treat a wide variety of medical conditions affecting the brain, spinal cord, nerves, bones, joints, ligaments, muscles, and tendons. Their training is primarily aimed at treating patients that have suffered injuries that have caused severe physical impairment. Their training is primarily aimed at the physical therapy and rehabilitation of those patients. They may also work alongside  orthopedic surgeons or neurosurgeons using their expertise in assisting surgical patients to recover after their surgeries.  INTERVENTIONAL PAIN MANAGEMENT is sub-subspecialty of Pain Management.  Our physicians are Board-certified in Anesthesia, Pain Management, and Interventional Pain Management.  This meaning that not only have they been trained and Board-certified in their specialty of Anesthesia, and   subspecialty of Pain Management, but they have also received further training in the sub-subspecialty of Interventional Pain Management, in order to become Board-certified as INTERVENTIONAL PAIN MANAGEMENT SPECIALIST.    Mission: Our goal is to use our skills in  INTERVENTIONAL PAIN MANAGEMENT as alternatives to the chronic use of prescription opioid medications for the treatment of pain. To make this more clear, we have changed our name to reflect what we do and offer. We will continue to offer medication management assessment and recommendations, but we will not be taking over any patient's medication management.  ____________________________________________________________________________________________     

## 2022-11-15 ENCOUNTER — Encounter: Payer: Self-pay | Admitting: Pain Medicine

## 2022-11-15 ENCOUNTER — Ambulatory Visit: Payer: Medicaid Other | Attending: Pain Medicine | Admitting: Pain Medicine

## 2022-11-15 VITALS — BP 135/76 | HR 72 | Temp 97.3°F | Resp 18 | Ht 61.0 in | Wt 230.0 lb

## 2022-11-15 DIAGNOSIS — M47812 Spondylosis without myelopathy or radiculopathy, cervical region: Secondary | ICD-10-CM | POA: Diagnosis not present

## 2022-11-15 DIAGNOSIS — R739 Hyperglycemia, unspecified: Secondary | ICD-10-CM

## 2022-11-15 DIAGNOSIS — M4802 Spinal stenosis, cervical region: Secondary | ICD-10-CM | POA: Insufficient documentation

## 2022-11-15 DIAGNOSIS — G8929 Other chronic pain: Secondary | ICD-10-CM | POA: Diagnosis present

## 2022-11-15 DIAGNOSIS — M51379 Other intervertebral disc degeneration, lumbosacral region without mention of lumbar back pain or lower extremity pain: Secondary | ICD-10-CM

## 2022-11-15 DIAGNOSIS — M25562 Pain in left knee: Secondary | ICD-10-CM | POA: Diagnosis present

## 2022-11-15 DIAGNOSIS — M47816 Spondylosis without myelopathy or radiculopathy, lumbar region: Secondary | ICD-10-CM | POA: Diagnosis not present

## 2022-11-15 DIAGNOSIS — M461 Sacroiliitis, not elsewhere classified: Secondary | ICD-10-CM | POA: Diagnosis present

## 2022-11-15 DIAGNOSIS — M4316 Spondylolisthesis, lumbar region: Secondary | ICD-10-CM | POA: Diagnosis not present

## 2022-11-15 DIAGNOSIS — M25561 Pain in right knee: Secondary | ICD-10-CM | POA: Diagnosis present

## 2022-11-15 DIAGNOSIS — M545 Low back pain, unspecified: Secondary | ICD-10-CM | POA: Diagnosis present

## 2022-11-15 DIAGNOSIS — M1711 Unilateral primary osteoarthritis, right knee: Secondary | ICD-10-CM | POA: Diagnosis present

## 2022-11-15 DIAGNOSIS — M25552 Pain in left hip: Secondary | ICD-10-CM | POA: Diagnosis present

## 2022-11-15 DIAGNOSIS — M47817 Spondylosis without myelopathy or radiculopathy, lumbosacral region: Secondary | ICD-10-CM

## 2022-11-15 DIAGNOSIS — M160 Bilateral primary osteoarthritis of hip: Secondary | ICD-10-CM

## 2022-11-15 DIAGNOSIS — M5137 Other intervertebral disc degeneration, lumbosacral region: Secondary | ICD-10-CM | POA: Insufficient documentation

## 2022-11-15 DIAGNOSIS — M542 Cervicalgia: Secondary | ICD-10-CM | POA: Insufficient documentation

## 2022-11-15 DIAGNOSIS — M25551 Pain in right hip: Secondary | ICD-10-CM | POA: Diagnosis present

## 2022-11-15 NOTE — Progress Notes (Signed)
Safety precautions to be maintained throughout the outpatient stay will include: orient to surroundings, keep bed in low position, maintain call bell within reach at all times, provide assistance with transfer out of bed and ambulation.  

## 2022-11-19 ENCOUNTER — Other Ambulatory Visit
Admission: RE | Admit: 2022-11-19 | Discharge: 2022-11-19 | Disposition: A | Discharge status: Home / Self Care | Payer: Medicaid Other | Attending: Pain Medicine | Admitting: Pain Medicine

## 2022-11-19 DIAGNOSIS — R739 Hyperglycemia, unspecified: Secondary | ICD-10-CM | POA: Insufficient documentation

## 2022-11-19 LAB — HEMOGLOBIN A1C
Hgb A1c MFr Bld: 8.1 % — ABNORMAL HIGH (ref 4.8–5.6)
Mean Plasma Glucose: 185.77 mg/dL

## 2022-11-25 NOTE — Progress Notes (Signed)
11-25-2022 Per 10-27-2022 signed [facility] "Authorization to [place] From" form, copies of the below [facility], dob-07-24-1976, medical records released and mailed to Disability Determination Services at [facility] [address] [place], [place] [address]. Medical records released include the following: 10-28-2004 to 05-02-2007 Nursing Notes; 11-17-1998 Routine Health Survey; 11-17-1998 [facility]) Annual Physical; 11-27-2003 and 02-06-2004 [facility] (FP) flow sheet; 06-26-2004 [facility] Immunization Record: and 02-17-2015 Pregnancy Test visit. See copy of signed ROI form. Inetta Fermo RN

## 2022-12-30 ENCOUNTER — Ambulatory Visit: Payer: Medicaid Other | Attending: Pain Medicine | Admitting: Pain Medicine

## 2022-12-30 ENCOUNTER — Encounter: Payer: Self-pay | Admitting: Pain Medicine

## 2022-12-30 ENCOUNTER — Ambulatory Visit
Admission: RE | Admit: 2022-12-30 | Discharge: 2022-12-30 | Disposition: A | Discharge status: Home / Self Care | Payer: Medicaid Other | Source: Ambulatory Visit | Attending: Pain Medicine | Admitting: Pain Medicine

## 2022-12-30 VITALS — BP 123/84 | HR 79 | Temp 97.5°F | Resp 21 | Ht 64.0 in | Wt 224.0 lb

## 2022-12-30 DIAGNOSIS — M4316 Spondylolisthesis, lumbar region: Secondary | ICD-10-CM | POA: Diagnosis present

## 2022-12-30 DIAGNOSIS — M545 Low back pain, unspecified: Secondary | ICD-10-CM | POA: Insufficient documentation

## 2022-12-30 DIAGNOSIS — G8929 Other chronic pain: Secondary | ICD-10-CM | POA: Insufficient documentation

## 2022-12-30 DIAGNOSIS — M47816 Spondylosis without myelopathy or radiculopathy, lumbar region: Secondary | ICD-10-CM | POA: Insufficient documentation

## 2022-12-30 DIAGNOSIS — M5137 Other intervertebral disc degeneration, lumbosacral region: Secondary | ICD-10-CM | POA: Insufficient documentation

## 2022-12-30 DIAGNOSIS — M25552 Pain in left hip: Secondary | ICD-10-CM | POA: Diagnosis present

## 2022-12-30 DIAGNOSIS — M47817 Spondylosis without myelopathy or radiculopathy, lumbosacral region: Secondary | ICD-10-CM | POA: Insufficient documentation

## 2022-12-30 DIAGNOSIS — M25551 Pain in right hip: Secondary | ICD-10-CM | POA: Insufficient documentation

## 2022-12-30 MED ORDER — FENTANYL CITRATE (PF) 100 MCG/2ML IJ SOLN
25.0000 ug | INTRAMUSCULAR | Status: DC | PRN
  Administered 2022-12-30: 50 ug via INTRAVENOUS

## 2022-12-30 MED ORDER — LIDOCAINE HCL 2 % IJ SOLN
INTRAMUSCULAR | Status: AC
  Filled 2022-12-30: qty 20

## 2022-12-30 MED ORDER — LIDOCAINE HCL 2 % IJ SOLN
20.0000 mL | Freq: Once | INTRAMUSCULAR | Status: AC
  Administered 2022-12-30: 400 mg

## 2022-12-30 MED ORDER — ROPIVACAINE HCL 2 MG/ML IJ SOLN
18.0000 mL | Freq: Once | INTRAMUSCULAR | Status: AC
  Administered 2022-12-30: 18 mL via PERINEURAL

## 2022-12-30 MED ORDER — MIDAZOLAM HCL 5 MG/5ML IJ SOLN
INTRAMUSCULAR | Status: AC
  Filled 2022-12-30: qty 5

## 2022-12-30 MED ORDER — TRIAMCINOLONE ACETONIDE 40 MG/ML IJ SUSP
80.0000 mg | Freq: Once | INTRAMUSCULAR | Status: AC
  Administered 2022-12-30: 80 mg

## 2022-12-30 MED ORDER — LACTATED RINGERS IV SOLN: Freq: Once | INTRAVENOUS | Status: AC

## 2022-12-30 MED ORDER — MIDAZOLAM HCL 5 MG/5ML IJ SOLN
0.5000 mg | Freq: Once | INTRAMUSCULAR | Status: AC
  Administered 2022-12-30: 2 mg via INTRAVENOUS

## 2022-12-30 MED ORDER — PENTAFLUOROPROP-TETRAFLUOROETH EX AERO
INHALATION_SPRAY | Freq: Once | CUTANEOUS | Status: AC
  Administered 2022-12-30: 30 via TOPICAL

## 2022-12-30 MED ORDER — ROPIVACAINE HCL 2 MG/ML IJ SOLN
INTRAMUSCULAR | Status: AC
  Filled 2022-12-30: qty 20

## 2022-12-30 MED ORDER — TRIAMCINOLONE ACETONIDE 40 MG/ML IJ SUSP
INTRAMUSCULAR | Status: AC
  Filled 2022-12-30: qty 2

## 2022-12-30 MED ORDER — FENTANYL CITRATE (PF) 100 MCG/2ML IJ SOLN
INTRAMUSCULAR | Status: AC
  Filled 2022-12-30: qty 2

## 2022-12-30 NOTE — Progress Notes (Signed)
PROVIDER NOTE: Interpretation of information contained herein should be left to medically-trained personnel. Specific patient instructions are provided elsewhere under "Patient Instructions" section of medical record. This document was created in part using STT-dictation technology, any transcriptional errors that may result from this process are unintentional.  Patient: Brenda Simmons Type: Established DOB: 1975-10-07 MRN: [id number] PCP: [facility] [place]  Service: Procedure DOS: 12/30/2022 Setting: Ambulatory Location: Ambulatory outpatient facility Delivery: Face-to-face Provider: Gaspar Cola, MD Specialty: Interventional Pain Management Specialty designation: 09 Location: Outpatient facility Ref. Prov.: Milinda Pointer, MD       Interventional Therapy   Procedure: Lumbar Facet, Medial Branch Block(s) #1  Laterality: Bilateral  Level: L3, L4, L5, and S1 Medial Branch Level(s). Injecting these levels blocks the L4-5 and L5-S1 lumbar facet joints. Imaging: Fluoroscopic guidance  (Pain mapping conducted) Anesthesia: Local anesthesia (1-2% Lidocaine) Anxiolysis: IV Versed 2.0 mg Sedation: Moderate Sedation Fentanyl 1 mL (50 mcg) DOS: 12/30/2022 Performed by: Gaspar Cola, MD  Primary Purpose: Diagnostic/Therapeutic Indications: Low back pain severe enough to impact quality of life or function. 1. Lumbosacral facet joint syndrome   2. Lumbar facet joint arthropathy (L4-5)   3. Spondylosis without myelopathy or radiculopathy, lumbosacral region   4. Grade 1 (5 mm) anterolisthesis of lumbar spine (L4/L5)   5. DDD (degenerative disc disease), lumbosacral   6. Chronic low back pain (1ry area of Pain) (Bilateral) (R>L) w/o sciatica    NAS-11 Pain score:   Pre-procedure: 8 /10   Post-procedure: 0-No pain/10     Position / Prep / Materials:  Position: Prone  Prep solution: DuraPrep (Iodine Povacrylex [0.7% available iodine] and Isopropyl Alcohol,  74% w/w) Area Prepped: Posterolateral Lumbosacral Spine (Wide prep: From the lower border of the scapula down to the end of the tailbone and from flank to flank.)  Materials:  Tray: Block Needle(s):  Type: Spinal  Gauge (G): 22  Length: 5-in Qty: 4      Pre-op H&P Assessment:  Ms. Juhas is a 47 y.o. (year old), female patient, seen today for interventional treatment. She  has a past surgical history that includes Other surgical history (Left, ankle surgery); Salpingectomy (Bilateral); Ankle surgery (Left); Breast cyst aspiration (Right, age 32); and Tubal ligation (Bilateral). Ms. Petriello has a current medication list which includes the following prescription(s): albuterol, albuterol, aspirin-salicylamide-caffeine, cetirizine, fluoxetine, fluticasone, rightest gs550 blood glucose, ibuprofen, montelukast, multivitamin with minerals, omeprazole, ondansetron, propranolol er, propranolol er, quetiapine, rightest gl300 lancets, sitagliptin-metformin, and gabapentin, and the following Facility-Administered Medications: fentanyl. Her primarily concern today is the Back Pain (lower)  Initial Vital Signs:  Pulse/HCG Rate: 78ECG Heart Rate: 79 (NSR) Temp: (!) 97.5 F (36.4 C) Resp: 16 BP: (!) 128/90 SpO2: 100 %  BMI: Estimated body mass index is 38.45 kg/m as calculated from the following:   Height as of this encounter: 5\' 4"  (1.626 m).   Weight as of this encounter: 224 lb (101.6 kg).  Risk Assessment: Allergies: Reviewed. She has No Known Allergies.  Allergy Precautions: None required Coagulopathies: Reviewed. None identified.  Blood-thinner therapy: None at this time Active Infection(s): Reviewed. None identified. Ms. Mirante is afebrile  Site Confirmation: Ms. Mccarey was asked to confirm the procedure and laterality before marking the site Procedure checklist: Completed Consent: Before the procedure and under the influence of no sedative(s), amnesic(s), or anxiolytics, the patient  was informed of the treatment options, risks and possible complications. To fulfill our ethical and legal obligations, as recommended by the American Medical Association's Code  of Ethics, I have informed the patient of my clinical impression; the nature and purpose of the treatment or procedure; the risks, benefits, and possible complications of the intervention; the alternatives, including doing nothing; the risk(s) and benefit(s) of the alternative treatment(s) or procedure(s); and the risk(s) and benefit(s) of doing nothing. The patient was provided information about the general risks and possible complications associated with the procedure. These may include, but are not limited to: failure to achieve desired goals, infection, bleeding, organ or nerve damage, allergic reactions, paralysis, and death. In addition, the patient was informed of those risks and complications associated to Spine-related procedures, such as failure to decrease pain; infection (i.e.: Meningitis, epidural or intraspinal abscess); bleeding (i.e.: epidural hematoma, subarachnoid hemorrhage, or any other type of intraspinal or peri-dural bleeding); organ or nerve damage (i.e.: Any type of peripheral nerve, nerve root, or spinal cord injury) with subsequent damage to sensory, motor, and/or autonomic systems, resulting in permanent pain, numbness, and/or weakness of one or several areas of the body; allergic reactions; (i.e.: anaphylactic reaction); and/or death. Furthermore, the patient was informed of those risks and complications associated with the medications. These include, but are not limited to: allergic reactions (i.e.: anaphylactic or anaphylactoid reaction(s)); adrenal axis suppression; blood sugar elevation that in diabetics may result in ketoacidosis or comma; water retention that in patients with history of congestive heart failure may result in shortness of breath, pulmonary edema, and decompensation with resultant heart  failure; weight gain; swelling or edema; medication-induced neural toxicity; particulate matter embolism and blood vessel occlusion with resultant organ, and/or nervous system infarction; and/or aseptic necrosis of one or more joints. Finally, the patient was informed that Medicine is not an exact science; therefore, there is also the possibility of unforeseen or unpredictable risks and/or possible complications that may result in a catastrophic outcome. The patient indicated having understood very clearly. We have given the patient no guarantees and we have made no promises. Enough time was given to the patient to ask questions, all of which were answered to the patient's satisfaction. Ms. Fialkowski has indicated that she wanted to continue with the procedure. Attestation: I, the ordering provider, attest that I have discussed with the patient the benefits, risks, side-effects, alternatives, likelihood of achieving goals, and potential problems during recovery for the procedure that I have provided informed consent. Date  Time: 12/30/2022  8:49 AM   Pre-Procedure Preparation:  Monitoring: As per clinic protocol. Respiration, ETCO2, SpO2, BP, heart rate and rhythm monitor placed and checked for adequate function Safety Precautions: Patient was assessed for positional comfort and pressure points before starting the procedure. Time-out: I initiated and conducted the "Time-out" before starting the procedure, as per protocol. The patient was asked to participate by confirming the accuracy of the "Time Out" information. Verification of the correct person, site, and procedure were performed and confirmed by me, the nursing staff, and the patient. "Time-out" conducted as per Joint Commission's Universal Protocol (UP.01.01.01). Time: [id number] Start Time: 0943 hrs.  Description of Procedure:          Laterality: (see above) Targeted Levels: (see above)  Safety Precautions: Aspiration looking for blood return was  conducted prior to all injections. At no point did we inject any substances, as a needle was being advanced. Before injecting, the patient was told to immediately notify me if she was experiencing any new onset of "ringing in the ears, or metallic taste in the mouth". No attempts were made at seeking any paresthesias. Safe injection practices  and needle disposal techniques used. Medications properly checked for expiration dates. SDV (single dose vial) medications used. After the completion of the procedure, all disposable equipment used was discarded in the proper designated medical waste containers. Local Anesthesia: Protocol guidelines were followed. The patient was positioned over the fluoroscopy table. The area was prepped in the usual manner. The time-out was completed. The target area was identified using fluoroscopy. A 12-in long, straight, sterile hemostat was used with fluoroscopic guidance to locate the targets for each level blocked. Once located, the skin was marked with an approved surgical skin marker. Once all sites were marked, the skin (epidermis, dermis, and hypodermis), as well as deeper tissues (fat, connective tissue and muscle) were infiltrated with a small amount of a short-acting local anesthetic, loaded on a 10cc syringe with a 25G, 1.5-in  Needle. An appropriate amount of time was allowed for local anesthetics to take effect before proceeding to the next step. Local Anesthetic: Lidocaine 2.0% The unused portion of the local anesthetic was discarded in the proper designated containers. Technical description of process:   L3 Medial Branch Nerve Block (MBB): The target area for the L3 medial branch is at the junction of the postero-lateral aspect of the superior articular process and the superior, posterior, and medial edge of the transverse process of L4. Under fluoroscopic guidance, a Quincke needle was inserted until contact was made with os over the superior postero-lateral aspect of  the pedicular shadow (target area). After negative aspiration for blood, 0.5 mL of the nerve block solution was injected without difficulty or complication. The needle was removed intact. L4 Medial Branch Nerve Block (MBB): The target area for the L4 medial branch is at the junction of the postero-lateral aspect of the superior articular process and the superior, posterior, and medial edge of the transverse process of L5. Under fluoroscopic guidance, a Quincke needle was inserted until contact was made with os over the superior postero-lateral aspect of the pedicular shadow (target area). After negative aspiration for blood, 0.5 mL of the nerve block solution was injected without difficulty or complication. The needle was removed intact. L5 Medial Branch Nerve Block (MBB): The target area for the L5 medial branch is at the junction of the postero-lateral aspect of the superior articular process and the superior, posterior, and medial edge of the sacral ala. Under fluoroscopic guidance, a Quincke needle was inserted until contact was made with os over the superior postero-lateral aspect of the pedicular shadow (target area). After negative aspiration for blood, 0.5 mL of the nerve block solution was injected without difficulty or complication. The needle was removed intact. S1 Medial Branch Nerve Block (MBB): The target area for the S1 medial branch is at the posterior and inferior 6 o'clock position of the L5-S1 facet joint. Under fluoroscopic guidance, the Quincke needle inserted for the L5 MBB was redirected until contact was made with os over the inferior and postero aspect of the sacrum, at the 6 o' clock position under the L5-S1 facet joint (Target area). After negative aspiration for blood, 0.5 mL of the nerve block solution was injected without difficulty or complication. The needle was removed intact.  Once the entire procedure was completed, the treated area was cleaned, making sure to leave some of  the prepping solution back to take advantage of its long term bactericidal properties.         Illustration of the posterior view of the lumbar spine and the posterior neural structures. Laminae of L2 through S1  are labeled. DPRL5, dorsal primary ramus of L5; DPRS1, dorsal primary ramus of S1; DPR3, dorsal primary ramus of L3; FJ, facet (zygapophyseal) joint L3-L4; I, inferior articular process of L4; LB1, lateral branch of dorsal primary ramus of L1; IAB, inferior articular branches from L3 medial branch (supplies L4-L5 facet joint); IBP, intermediate branch plexus; MB3, medial branch of dorsal primary ramus of L3; NR3, third lumbar nerve root; S, superior articular process of L5; SAB, superior articular branches from L4 (supplies L4-5 facet joint also); TP3, transverse process of L3.   Facet Joint Innervation (* possible contribution)  L1-2 T12, L1 (L2*)  Medial Branch  L2-3 L1, L2 (L3*)         "          "  L3-4 L2, L3 (L4*)         "          "  L4-5 L3, L4 (L5*)         "          "  L5-S1 L4, L5, S1          "          "    Vitals:   12/30/22 0953 12/30/22 1002 12/30/22 1012 12/30/22 1022  BP: 112/73 110/69 128/85 123/84  Pulse:      Resp: 15 17 16  (!) 21  Temp:      SpO2: 100% 98% 98% 98%  Weight:      Height:         End Time: 0953 hrs.  Imaging Guidance (Spinal):          Type of Imaging Technique: Fluoroscopy Guidance (Spinal) Indication(s): Assistance in needle guidance and placement for procedures requiring needle placement in or near specific anatomical locations not easily accessible without such assistance. Exposure Time: Please see nurses notes. Contrast: None used. Fluoroscopic Guidance: I was personally present during the use of fluoroscopy. "Tunnel Vision Technique" used to obtain the best possible view of the target area. Parallax error corrected before commencing the procedure. "Direction-depth-direction" technique used to introduce the needle under continuous  pulsed fluoroscopy. Once target was reached, antero-posterior, oblique, and lateral fluoroscopic projection used confirm needle placement in all planes. Images permanently stored in EMR.     Pain seems to be affecting the L4-5 and L5-S1, bilaterally.  Interpretation: No contrast injected. I personally interpreted the imaging intraoperatively. Adequate needle placement confirmed in multiple planes. Permanent images saved into the patient's record.  Post-operative Assessment:  Post-procedure Vital Signs:  Pulse/HCG Rate: 7973 Temp: (!) 97.5 F (36.4 C) Resp: (!) 21 BP: 123/84 SpO2: 98 %  EBL: None  Complications: No immediate post-treatment complications observed by team, or reported by patient.  Note: The patient tolerated the entire procedure well. A repeat set of vitals were taken after the procedure and the patient was kept under observation following institutional policy, for this type of procedure. Post-procedural neurological assessment was performed, showing return to baseline, prior to discharge. The patient was provided with post-procedure discharge instructions, including a section on how to identify potential problems. Should any problems arise concerning this procedure, the patient was given instructions to immediately contact us, at any time, without hesitation. In any case, we plan to contact the patient by telephone for a follow-up status report regarding this interventional procedure.  Comments:  No additional relevant information.  Plan of Care (POC)  Orders:  Orders Placed This Encounter  Procedures   LUMBAR FACET(MEDIAL BRANCH NERVE  BLOCK) MBNB    Scheduling Instructions:     Procedure: Lumbar facet block (AKA.: Lumbosacral medial branch nerve block)     Side: Bilateral     Level: L4-5, L5-S1, and TBD Facets (L3, L4, L5, S1, and TBD Medial Branch Nerves)     Sedation: Patient's choice.     Timeframe: Today    Order Specific Question:   Where will this procedure be  performed?    Answer:   [facility]   DG [facility] C-ARM 1-60 MIN NO REPORT    Intraoperative interpretation by procedural physician at [facility].    Standing Status:   Standing    Number of Occurrences:   1    Order Specific Question:   Reason for exam:    Answer:   Assistance in needle guidance and placement for procedures requiring needle placement in or near specific anatomical locations not easily accessible without such assistance.   Informed Consent Details: Physician/Practitioner Attestation; Transcribe to consent form and obtain patient signature    Nursing Order: Transcribe to consent form and obtain patient signature. Note: Always confirm laterality of pain with Ms. Bachmann, before procedure.    Order Specific Question:   Physician/Practitioner attestation of informed consent for procedure/surgical case    Answer:   I, the physician/practitioner, attest that I have discussed with the patient the benefits, risks, side effects, alternatives, likelihood of achieving goals and potential problems during recovery for the procedure that I have provided informed consent.    Order Specific Question:   Procedure    Answer:   Lumbar Facet Block  under fluoroscopic guidance    Order Specific Question:   Physician/Practitioner performing the procedure    Answer:    A. Dossie Arbour MD    Order Specific Question:   Indication/Reason    Answer:   Low Back Pain, with our without leg pain, due to Facet Joint Arthralgia (Joint Pain) Spondylosis (Arthritis of the Spine), without myelopathy or radiculopathy (Nerve Damage).   Provide equipment / supplies at bedside    Procedure tray: "Block Tray" (Disposable  single use) Skin infiltration needle: Regular 1.5-in, 25-G, (x1) Block Needle type: Spinal Amount/quantity: 4 Size: Medium (5-inch) Gauge: 22G    Standing Status:   Standing    Number of Occurrences:   1    Order Specific Question:   Specify    Answer:   Block Tray    Chronic Opioid Analgesic:  None MME/day: 0 mg/day   Medications ordered for procedure: Meds ordered this encounter  Medications   lidocaine (XYLOCAINE) 2 % (with pres) injection 400 mg   pentafluoroprop-tetrafluoroeth (GEBAUERS) aerosol   lactated ringers infusion   midazolam (VERSED) 5 MG/5ML injection 0.5-2 mg    Make sure Flumazenil is available in the pyxis when using this medication. If oversedation occurs, administer 0.2 mg IV over 15 sec. If after 45 sec no response, administer 0.2 mg again over 1 min; may repeat at 1 min intervals; not to exceed 4 doses (1 mg)   fentaNYL (SUBLIMAZE) injection 25-50 mcg    Make sure Narcan is available in the pyxis when using this medication. In the event of respiratory depression (RR< 8/min): Titrate NARCAN (naloxone) in increments of 0.1 to 0.2 mg IV at 2-3 minute intervals, until desired degree of reversal.   ropivacaine (PF) 2 mg/mL (0.2%) (NAROPIN) injection 18 mL   triamcinolone acetonide (KENALOG-40) injection 80 mg   Medications administered: We administered lidocaine, pentafluoroprop-tetrafluoroeth, lactated ringers, midazolam, fentaNYL, ropivacaine (  PF) 2 mg/mL (0.2%), and triamcinolone acetonide.  See the medical record for exact dosing, route, and time of administration.  Follow-up plan:   Return in about 2 weeks (around 01/13/2023) for Proc-day (T,Th), (Face2F), (PPE).       Interventional Therapies  Risk Factors  Considerations:  MO  T2NIDDM  HTN  Anxiety   Planned  Pending:   Diagnostic bilateral lumbar facet MBB #1    Under consideration:   Diagnostic bilateral lumbar facet MBB #1  Diagnostic bilateral SI joint block #1  Diagnostic bilateral IA hip joint injection #1  Diagnostic bilateral IA knee joint injection #1  Diagnostic bilateral cervical facet MBB #1    Completed:   None at this time   Completed by other providers:   None at this time   Therapeutic  Palliative (PRN) options:   None established         Recent Visits Date Type Provider Dept  11/15/22 Office Visit Milinda Pointer, [facility]  10/18/22 Office Visit Milinda Pointer, MD [facility]  Showing recent visits within past 90 days and meeting all other requirements Today's Visits Date Type Provider Dept  12/30/22 Procedure visit Milinda Pointer, MD [facility]  Showing today's visits and meeting all other requirements Future Appointments Date Type Provider Dept  01/13/23 Appointment Milinda Pointer, MD [facility]  Showing future appointments within next 90 days and meeting all other requirements  Disposition: Discharge home  Discharge (Date  Time): 12/30/2022; 1023 hrs.   Primary Care Physician: [facility] Location: [facility] Note by: Gaspar Cola, MD (TTS technology used. I apologize for any typographical errors that were not detected and corrected.) Date: 12/30/2022; Time: 11:32 AM  Disclaimer:  Medicine is not an Chief Strategy Officer. The only guarantee in medicine is that nothing is guaranteed. It is important to note that the decision to proceed with this intervention was based on the information collected from the patient. The Data and conclusions were drawn from the patient's questionnaire, the interview, and the physical examination. Because the information was provided in large part by the patient, it cannot be guaranteed that it has not been purposely or unconsciously manipulated. Every effort has been made to obtain as much relevant data as possible for this evaluation. It is important to note that the conclusions that lead to this procedure are derived in large part from the available data. Always take into account that the treatment will also be dependent on availability of resources and existing treatment guidelines, considered by other Pain Management Practitioners as being common knowledge and  practice, at the time of the intervention. For Medico-Legal purposes, it is also important to point out that variation in procedural techniques and pharmacological choices are the acceptable norm. The indications, contraindications, technique, and results of the above procedure should only be interpreted and judged by a Board-Certified Interventional Pain Specialist with extensive familiarity and expertise in the same exact procedure and technique.

## 2022-12-30 NOTE — Patient Instructions (Signed)

## 2022-12-31 ENCOUNTER — Telehealth: Payer: Self-pay

## 2022-12-31 NOTE — Telephone Encounter (Signed)
Post procedure follow up.  Patient states she is doing good.  

## 2023-01-03 ENCOUNTER — Encounter: Payer: Self-pay | Admitting: Cardiology

## 2023-01-13 ENCOUNTER — Ambulatory Visit: Payer: Medicaid Other | Admitting: Pain Medicine

## 2023-01-27 ENCOUNTER — Ambulatory Visit: Payer: Medicaid Other | Attending: Pain Medicine | Admitting: Pain Medicine

## 2023-01-27 ENCOUNTER — Encounter: Payer: Self-pay | Admitting: Pain Medicine

## 2023-01-27 VITALS — BP 127/89 | HR 76 | Temp 98.0°F | Resp 16 | Ht 64.0 in | Wt 219.0 lb

## 2023-01-27 DIAGNOSIS — G8929 Other chronic pain: Secondary | ICD-10-CM | POA: Insufficient documentation

## 2023-01-27 DIAGNOSIS — M461 Sacroiliitis, not elsewhere classified: Secondary | ICD-10-CM | POA: Diagnosis present

## 2023-01-27 DIAGNOSIS — M25551 Pain in right hip: Secondary | ICD-10-CM | POA: Diagnosis present

## 2023-01-27 DIAGNOSIS — M47898 Other spondylosis, sacral and sacrococcygeal region: Secondary | ICD-10-CM

## 2023-01-27 DIAGNOSIS — M533 Sacrococcygeal disorders, not elsewhere classified: Secondary | ICD-10-CM | POA: Diagnosis present

## 2023-01-27 DIAGNOSIS — M5459 Other low back pain: Secondary | ICD-10-CM | POA: Diagnosis present

## 2023-01-27 DIAGNOSIS — M545 Low back pain, unspecified: Secondary | ICD-10-CM | POA: Insufficient documentation

## 2023-01-27 DIAGNOSIS — Z09 Encounter for follow-up examination after completed treatment for conditions other than malignant neoplasm: Secondary | ICD-10-CM | POA: Insufficient documentation

## 2023-01-27 DIAGNOSIS — M47817 Spondylosis without myelopathy or radiculopathy, lumbosacral region: Secondary | ICD-10-CM | POA: Diagnosis present

## 2023-01-27 DIAGNOSIS — M9904 Segmental and somatic dysfunction of sacral region: Secondary | ICD-10-CM | POA: Diagnosis present

## 2023-01-27 DIAGNOSIS — M25552 Pain in left hip: Secondary | ICD-10-CM | POA: Diagnosis present

## 2023-01-27 NOTE — Progress Notes (Signed)
Safety precautions to be maintained throughout the outpatient stay will include: orient to surroundings, keep bed in low position, maintain call bell within reach at all times, provide assistance with transfer out of bed and ambulation.  

## 2023-01-27 NOTE — Progress Notes (Signed)
PROVIDER NOTE: Information contained herein reflects review and annotations entered in association with encounter. Interpretation of such information and data should be left to medically-trained personnel. Information provided to patient can be located elsewhere in the medical record under "Patient Instructions". Document created using STT-dictation technology, any transcriptional errors that may result from process are unintentional.    Patient: Brenda Simmons  Service Category: E/M  Provider: Oswaldo Done, MD  DOB: 11-11-1975  DOS: 01/27/2023  Referring Provider: Center, Darcella Gasman*  MRN: [id number]  Specialty: Interventional Pain Management  PCP: [facility]  Type: Established Patient  Setting: Ambulatory outpatient    Location: Office  Delivery: Face-to-face     HPI  [name], a 47 y.o. year old female, is here today because of her Chronic bilateral low back pain without sciatica [M54.50, G89.29]. Brenda Simmons's primary complain today is Back Pain (lower)  Pertinent problems: Brenda Simmons has Disorder of rotator cuff; Chronic neck pain (2ry area of Pain) (Bilateral) (R>L); Chronic right shoulder pain; Migraine headache without aura; Migrainous dizziness; Right-sided low back pain with right-sided sciatica; Bilateral lower extremity pain; Chronic tension-type headache, not intractable; Herniated cervical disc; Osteoarthritis of spine with radiculopathy, cervical region; Right rotator cuff tear; Shoulder tendinitis, right; Abnormal CT scan, cervical spine (01/24/2022); DDD (degenerative disc disease), cervical (C5-C7); Chronic pain syndrome; Chronic low back pain (1ry area of Pain) (Bilateral) (R>L) w/o sciatica; Chronic hip pain (3ry area of Pain) (Bilateral) (R>L); Chronic knee pain (4th area of Pain) (Bilateral) (R>L); Chronic lower extremity pain (5th area of Pain) (Bilateral) (L>R); History of ankle surgery old (2006) (Left); Numbness of lower extremity  (Intermittent) (Bilateral); DDD (degenerative disc disease), lumbosacral; Osteoarthritis of knee (Right); Osteoarthritis of hips (Bilateral); Osteoarthritis of sacroiliac joints (Bilateral) (HCC); Cervical facet hypertrophy (Multilevel) (Bilateral); Cervical foraminal stenosis  (Right: C3-4) (Bilateral: C4-5, C5-6) (Left: C6-7); Grade 1 (5 mm) anterolisthesis of lumbar spine (L4/L5); Lumbar facet joint arthropathy (L4-5); Lumbosacral facet joint syndrome; Spondylosis without myelopathy or radiculopathy, lumbosacral region; Lumbar facet joint pain; Chronic sacroiliac joint pain (Bilateral) (R>L); Disorder of sacroiliac joints (Bilateral); Somatic dysfunction of sacroiliac joints (Bilateral); and Other spondylosis, sacral and sacrococcygeal region on their pertinent problem list. Pain Assessment: Severity of Chronic pain is reported as a 8 /10. Location: Back Lower, Right, Left/hips/buttocks bilateral and down back of legs down to the bottom of feet. Onset: More than a month ago. Quality: Nagging, Aching, Constant, Discomfort. Timing: Constant. Modifying factor(s): rest, propping legs up. Vitals:  height is 5\' 4"  (1.626 m) and weight is 219 lb (99.3 kg). Her temperature is 98 F (36.7 C). Her blood pressure is 127/89 and her pulse is 76. Her respiration is 16 and oxygen saturation is 99%.  BMI: Estimated body mass index is 37.59 kg/m as calculated from the following:   Height as of this encounter: 5\' 4"  (1.626 m).   Weight as of this encounter: 219 lb (99.3 kg). Last encounter: 11/15/2022. Last procedure: 12/30/2022.  Reason for encounter: post-procedure evaluation and assessment.  According to the patient she attained 100% relief of the pain for the first hour followed by a decrease to an 80% improvement that lasted for the duration of the local anesthetic.  However, she did not get any long-term benefit from the steroids.  The relief that she attained during that first hour is likely to have been from the  combination of the facet blocks, the Versed, and the fentanyl which would have a duration of approximately  1 hour.  Once the fentanyl and Versed was metabolized, what remained was the benefit from the local anesthetic.  This means that at least 80% of her pain seems to be coming from the area of the L4-5 and L5-S1 facet joints, bilaterally.  However, there is another 20% that seems to be unaccounted for.  During today's physical exam, hyperextension and rotation maneuver of the lumbar spine resulted in a positive bilateral, ipsilateral facet arthralgia being triggered.  The provocative hyperextension and rotation maneuver was concordant with the [facility] maneuver, bilaterally.  Interestingly, lateral bending of the lumbar spine also seem to have triggered some pain going to the lateral aspect of the hips, ipsilateral to the side that she was bending into.  Imaging studies of the lumbar spine revealed that the patient has a grade 1 anterolisthesis of L4 over L5.  This lateral bending maneuver of the lumbar spine is likely to be decreasing the cephalocaudad diameter of the neural foramina at that level which in turn would trigger pain in the distribution of the L4, which is the area where she was indicating that she was experiencing the pain when she would bend.  Interestingly, she does not seem to be having that type of pain on a regular basis suggesting that although she is having some degree of foraminal stenosis at the L4-5 level, this is not causing constant impingement of the L4 nerve.  It is only when challenged that he seems to cause that discomfort.  Provocative testing of the sacroiliac joint today did confirm the patient to be having sacroiliac joint arthralgia that is bilateral with the right side being worse than the left.  The provocative Luisa Hart maneuver was positive bilaterally for sacroiliac joint arthralgia with the right being worse than the left and he was also positive bilaterally for hip joint  arthralgia this time with the left being worse than the right.  Sacroiliac Joint Exam Inspection: No masses, redness, or swelling. Alignment: Symmetrical         Pain Pattern: Articular.   Sacroiliac Provocative Tests Item Cluster:   Positive Sacral Sulcus Palpation (Standing or sitting. PSIS pressure.) (Sensitive: 95% ):  Right: (Y) Left: (Y)   Positive SI Distraction test (anterior) (Supine. Downward pressure over bilateral ASIS) (Reliability: 65.4%  Sensitivity: 25.75%  Specificity: 88.2%  (PPV): 60%  (NPV): 81%):  Right: (Y) Left: (Y)   Positive Thigh-thrust test (Supine. LE flexed on tested side.) (Reliability: 74%  Sensitivity: 88%  Specificity: 75%  Positive predictive value (PPV): 58%  Negative predictive value (NPV): 92%):  Right: (Y) Left: (Y)   Positive Gaenslen's maneuver (Supine. Tested side held hyperextended while flexing opposite.) (Sensitivity: 50-53%  Specificity: %  Positive predictive value (PPV): 47-50%  Negative predictive value (NPV): 76-77%):  Right: (Y) Left: (Y)   Positive FABER (Patrick's) maneuver (FABER: Supine. Leg Flexion, ABduction and External Rotation) (Sensitivity: 69%  Specificity: 100%  (PPV): 100%  (NPV): 9%):  Right: (Y) Left: (Y)   Positive SI Compression test (Lateral decubitus. Symptomatic side up.) (Reliability: 84%  Sensitivity: 100%  Specificity: 89%  (PPV): 52%  (NPV): 82%):  Right: (Y) Left: (Y)  Today's postprocedure evaluation results as well as physical exam would suggest the patient's low back pain to be multifactorial.  She seems to be having a component of the low back pain that is coming from the facet joints as well as the sacroiliac joint.  In addition, the pain that she is experiencing in the hip and buttocks area also seems  to be multifactorial.  The patient indicates that while the numbing medicine was in place she was not having any lower extremity pain suggesting that there is a referred pain  component to this lower extremity discomfort.  However, physical exam today also indicated that it also has a component coming from the hip joints, as well as a component seems to be present only during provocative maneuvers of the lumbar spine suggesting a possible influence from the foraminal stenosis typically seen with an anterolisthesis, in this case at the L4/L5 level.  Based on today's evaluation, the neck step would be to bring the patient in for a diagnostic bilateral sacroiliac joint block.  If this provides the patient with partial relief of the pain, similar to the 1 obtained from the facet blocks, this would confirm that the patient's low back pain is a combination of facet joint and sacroiliac joint arthralgia.  In that case, we would go ahead and look into the possibility of radiofrequency ablation to provide her with longer lasting benefit.  The above information was shared with the patient who understood and accepted.  Post-procedure evaluation   Procedure: Lumbar Facet, Medial Branch Block(s) #1  Laterality: Bilateral  Level: L3, L4, L5, and S1 Medial Branch Level(s). Injecting these levels blocks the L4-5 and L5-S1 lumbar facet joints. Imaging: Fluoroscopic guidance  (Pain mapping conducted) Anesthesia: Local anesthesia (1-2% Lidocaine) Anxiolysis: IV Versed 2.0 mg Sedation: Moderate Sedation Fentanyl 1 mL (50 mcg) DOS: 12/30/2022 Performed by: Oswaldo Done, MD  Primary Purpose: Diagnostic/Therapeutic Indications: Low back pain severe enough to impact quality of life or function. 1. Lumbosacral facet joint syndrome   2. Lumbar facet joint arthropathy (L4-5)   3. Spondylosis without myelopathy or radiculopathy, lumbosacral region   4. Grade 1 (5 mm) anterolisthesis of lumbar spine (L4/L5)   5. DDD (degenerative disc disease), lumbosacral   6. Chronic low back pain (1ry area of Pain) (Bilateral) (R>L) w/o sciatica    NAS-11 Pain score:   Pre-procedure: 8 /10    Post-procedure: 0-No pain/10      Effectiveness:  Initial hour after procedure: 100 %. Subsequent 4-6 hours post-procedure: 80 %. Analgesia past initial 6 hours: 0 %. Ongoing improvement:  Analgesic: While the patient did obtain some good relief of the pain for the duration of the local anesthetic, she does not seem to have attained any long-term benefit from the steroids. Function: Back to baseline ROM: Back to baseline  Pharmacotherapy Assessment  Analgesic: None MME/day: 0 mg/day   Monitoring: [place] PMP: PDMP not reviewed this encounter.       Pharmacotherapy: No side-effects or adverse reactions reported. Compliance: No problems identified. Effectiveness: Clinically acceptable.  Newman Pies, RN  01/27/2023  1:12 PM  Sign when Signing Visit Safety precautions to be maintained throughout the outpatient stay will include: orient to surroundings, keep bed in low position, maintain call bell within reach at all times, provide assistance with transfer out of bed and ambulation.     No results found for: "CBDTHCR" No results found for: "D8THCCBX" No results found for: "D9THCCBX"  UDS:  Summary  Date Value Ref Range Status  10/18/2022 Note  Final    Comment:    ==================================================================== Compliance Drug Analysis, Ur ==================================================================== Test                             Result       Flag  Units  Drug Present and Declared for Prescription Verification   Gabapentin                     PRESENT      EXPECTED   Salicylate                     PRESENT      EXPECTED   Propranolol                    PRESENT      EXPECTED  Drug Present not Declared for Prescription Verification   Naproxen                       PRESENT      UNEXPECTED  Drug Absent but Declared for Prescription Verification   Fluoxetine                     Not Detected UNEXPECTED   Quetiapine                     Not  Detected UNEXPECTED   Ibuprofen                      Not Detected UNEXPECTED    Ibuprofen, as indicated in the declared medication list, is not    always detected even when used as directed.  ==================================================================== Test                      Result    Flag   Units      Ref Range   Creatinine              139              mg/dL      >=16 ==================================================================== Declared Medications:  The flagging and interpretation on this report are based on the  following declared medications.  Unexpected results may arise from  inaccuracies in the declared medications.   **Note: The testing scope of this panel includes these medications:   Fluoxetine (Prozac)  Gabapentin (Neurontin)  Propranolol  Quetiapine (Seroquel)   **Note: The testing scope of this panel does not include small to  moderate amounts of these reported medications:   Aspirin  Ibuprofen (Advil)  Salicylate (Salicylamide)   **Note: The testing scope of this panel does not include the  following reported medications:   Albuterol  Caffeine  Cetirizine (Zyrtec)  Fluticasone (Flonase)  Metformin (Janumet)  Montelukast  Multivitamin  Omeprazole  Ondansetron  Sitagliptin (Janumet) ==================================================================== For clinical consultation, please call [phone]. ====================================================================       ROS  Constitutional: Denies any fever or chills Gastrointestinal: No reported hemesis, hematochezia, vomiting, or acute GI distress Musculoskeletal: Denies any acute onset joint swelling, redness, loss of ROM, or weakness Neurological: No reported episodes of acute onset apraxia, aphasia, dysarthria, agnosia, amnesia, paralysis, loss of coordination, or loss of consciousness  Medication Review  Aspirin-Salicylamide-Caffeine, FLUoxetine, QUEtiapine, Rightest  GL300 Lancets, SUMAtriptan, albuterol, cetirizine, fluticasone, gabapentin, glucose blood, ibuprofen, lisinopril, montelukast, multivitamin with minerals, omeprazole, ondansetron, propranolol ER, and sitaGLIPtin-metformin  History Review  Allergy: Brenda Simmons has No Known Allergies. Drug: Brenda Simmons  reports no history of drug use. Alcohol:  reports current alcohol use. Tobacco:  reports that she has been smoking cigarettes. She has a 3.75 pack-year smoking history. She has never used smokeless tobacco. Social: Ms. Flam  reports that she has been smoking cigarettes. She has a 3.75 pack-year smoking history. She has never used smokeless tobacco. She reports current alcohol use. She reports that she does not use drugs. Medical:  has a past medical history of Anxiety, Breast mass (x 3 mos), Bulging lumbar disc, Chronic bronchitis (HCC), Depression, Diabetes mellitus without complication (HCC), Hypertension, Migraines, Partial tear of rotator cuff, and Pre-diabetes. Surgical: Brenda Simmons  has a past surgical history that includes Other surgical history (Left, ankle surgery); Salpingectomy (Bilateral); Ankle surgery (Left); Breast cyst aspiration (Right, age 26); and Tubal ligation (Bilateral). Family: family history includes Cancer in her maternal grandmother and paternal grandmother; Depression in her brother and mother; Diabetes in her mother; Hypertension in her father, mother, and sister.  Laboratory Chemistry Profile   Renal Lab Results  Component Value Date   BUN 7 10/18/2022   CREATININE 0.86 10/18/2022   BCR 8 (L) 10/18/2022   GFRAA >60 07/28/2016   GFRNONAA >60 03/14/2022    Hepatic Lab Results  Component Value Date   AST 14 10/18/2022   ALT 11 (L) 07/28/2016   ALBUMIN 3.8 (L) 10/18/2022   ALKPHOS 114 10/18/2022   LIPASE 103 09/17/2014    Electrolytes Lab Results  Component Value Date   NA 143 10/18/2022   K 4.6 10/18/2022   CL 106 10/18/2022   CALCIUM 8.9 10/18/2022    MG 1.7 10/18/2022    Bone Lab Results  Component Value Date   VD25OH 23.0 (L) 12/04/2015   25OHVITD1 9.2 (L) 10/18/2022   25OHVITD2 <1.0 10/18/2022   25OHVITD3 9.1 10/18/2022    Inflammation (CRP: Acute Phase) (ESR: Chronic Phase) Lab Results  Component Value Date   CRP 14 (H) 10/18/2022   ESRSEDRATE 7 10/18/2022         Note: Above Lab results reviewed.  Recent Imaging Review  DG PAIN CLINIC C-ARM 1-60 MIN NO REPORT Fluoro was used, but no Radiologist interpretation will be provided.  Please refer to "NOTES" tab for provider progress note. Note: Reviewed        Physical Exam  General appearance: Well nourished, well developed, and well hydrated. In no apparent acute distress Mental status: Alert, oriented x 3 (person, place, & time)       Respiratory: No evidence of acute respiratory distress Eyes: PERLA Vitals: BP 127/89   Pulse 76   Temp 98 F (36.7 C)   Resp 16   Ht 5\' 4"  (1.626 m)   Wt 219 lb (99.3 kg)   SpO2 99%   BMI 37.59 kg/m  BMI: Estimated body mass index is 37.59 kg/m as calculated from the following:   Height as of this encounter: 5\' 4"  (1.626 m).   Weight as of this encounter: 219 lb (99.3 kg). Ideal: Ideal body weight: 54.7 kg (120 lb 9.5 oz) Adjusted ideal body weight: 72.6 kg (159 lb 15.3 oz)  Assessment   Diagnosis Status  1. Chronic low back pain (1ry area of Pain) (Bilateral) (R>L) w/o sciatica   2. Lumbar facet joint pain   3. Chronic sacroiliac joint pain (Bilateral) (R>L)   4. Lumbosacral facet joint syndrome   5. Chronic hip pain (3ry area of Pain) (Bilateral) (R>L)   6. Spondylosis without myelopathy or radiculopathy, lumbosacral region   7. Disorder of sacroiliac joints (Bilateral)   8. Somatic dysfunction of sacroiliac joints (Bilateral)   9. Osteoarthritis of sacroiliac joints (Bilateral) (HCC)   10. Other spondylosis, sacral and sacrococcygeal region   11. Postop check  Improving Persistent Pending treatment   Updated  Problems: Problem  Lumbar Facet Joint Pain  Chronic sacroiliac joint pain (Bilateral) (R>L)  Disorder of sacroiliac joints (Bilateral)  Somatic dysfunction of sacroiliac joints (Bilateral)  Other Spondylosis, Sacral and Sacrococcygeal Region    Plan of Care  Problem-specific:  No problem-specific Assessment & Plan notes found for this encounter.  Brenda Simmons has a current medication list which includes the following long-term medication(s): albuterol, albuterol, cetirizine, fluoxetine, fluticasone, lisinopril, montelukast, omeprazole, propranolol er, propranolol er, quetiapine, sitagliptin-metformin, sumatriptan, and gabapentin.  Pharmacotherapy (Medications Ordered): No orders of the defined types were placed in this encounter.  Orders:  Orders Placed This Encounter  Procedures   SACROILIAC JOINT INJECTION    Physical Examination Findings: Positive Sacral Thrust (Sacral Spring, Downward Pressure): (Y) Positive FABER maneuver (Patrick's): (Y) Positive SI distraction (Gapping): (Y) Positive SI compression (Approximation): (Y) Positive Thigh Thrust:  (Y) Positive Gaenslen's: (Y) Positive Sacral Sulcus Tenderness: (Y)    Standing Status:   Future    Standing Expiration Date:   04/29/2023    Scheduling Instructions:     Procedure: Sacroiliac Joint Injection     Side  Laterality: Bilateral     Sedation: Patient's choice.     Timeframe: ASAP    Order Specific Question:   Where will this procedure be performed?    Answer:   [facility]   Nursing Instructions:    Please complete this patient's postprocedure evaluation.    Scheduling Instructions:     Please complete this patient's postprocedure evaluation.   Follow-up plan:   Return for (ECT): (B) SI Blk #1.      Interventional Therapies  Risk Factors  Considerations:  MO  [id number]  HTN  Anxiety   Planned  Pending:   Diagnostic bilateral sacroiliac joint Blk #1     Under consideration:    Diagnostic bilateral (L4-5, L5-S1) lumbar facet (L3-S1) MBB #2  Possible bilateral lumbar facet RFA #1  Diagnostic bilateral SI joint Blk #1  Diagnostic bilateral IA hip joint injection #1  Diagnostic bilateral IA knee joint injection #1  Diagnostic bilateral cervical facet MBB #1    Completed:   Diagnostic bilateral (L4-5, L5-S1) lumbar facet (L3-S1) MBB x1    Completed by other providers:   None at this time   Therapeutic  Palliative (PRN) options:   None established      Recent Visits Date Type Provider Dept  12/30/22 Procedure visit Delano Metz, MD [facility]  11/15/22 Office Visit Delano Metz, MD [facility]  Showing recent visits within past 90 days and meeting all other requirements Today's Visits Date Type Provider Dept  01/27/23 Office Visit Delano Metz, MD [facility]  Showing today's visits and meeting all other requirements Future Appointments Date Type Provider Dept  03/01/23 Appointment Delano Metz, MD [facility]  Showing future appointments within next 90 days and meeting all other requirements  I discussed the assessment and treatment plan with the patient. The patient was provided an opportunity to ask questions and all were answered. The patient agreed with the plan and demonstrated an understanding of the instructions.  Patient advised to call back or seek an in-person evaluation if the symptoms or condition worsens.  Duration of encounter: 58 minutes.  Total time on encounter, as per AMA guidelines included both the face-to-face and non-face-to-face time personally spent by the physician and/or other qualified health care professional(s) on the day of the encounter (includes time in  activities that require the physician or other qualified health care professional and does not include time in activities normally performed by clinical staff). Physician's time may include the following  activities when performed: Preparing to see the patient (e.g., pre-charting review of records, searching for previously ordered imaging, lab work, and nerve conduction tests) Review of prior analgesic pharmacotherapies. Reviewing PMP Interpreting ordered tests (e.g., lab work, imaging, nerve conduction tests) Performing post-procedure evaluations, including interpretation of diagnostic procedures Obtaining and/or reviewing separately obtained history Performing a medically appropriate examination and/or evaluation Counseling and educating the patient/family/caregiver Ordering medications, tests, or procedures Referring and communicating with other health care professionals (when not separately reported) Documenting clinical information in the electronic or other health record Independently interpreting results (not separately reported) and communicating results to the patient/ family/caregiver Care coordination (not separately reported)  Note by: Oswaldo Done, MD Date: 01/27/2023; Time: 2:18 PM

## 2023-01-27 NOTE — Patient Instructions (Signed)

## 2023-02-15 ENCOUNTER — Other Ambulatory Visit: Payer: Self-pay

## 2023-03-01 ENCOUNTER — Ambulatory Visit: Payer: Medicaid Other | Admitting: Pain Medicine

## 2023-03-10 ENCOUNTER — Ambulatory Visit: Payer: Medicaid Other | Attending: Pain Medicine | Admitting: Pain Medicine

## 2023-03-10 ENCOUNTER — Encounter: Payer: Self-pay | Admitting: Pain Medicine

## 2023-03-10 ENCOUNTER — Ambulatory Visit
Admission: RE | Admit: 2023-03-10 | Discharge: 2023-03-10 | Disposition: A | Discharge status: Home / Self Care | Payer: Medicaid Other | Source: Ambulatory Visit | Attending: Pain Medicine | Admitting: Pain Medicine

## 2023-03-10 VITALS — BP 117/69 | HR 74 | Temp 97.2°F | Resp 14 | Ht 64.0 in | Wt 219.0 lb

## 2023-03-10 DIAGNOSIS — G8929 Other chronic pain: Secondary | ICD-10-CM | POA: Diagnosis not present

## 2023-03-10 DIAGNOSIS — F419 Anxiety disorder, unspecified: Secondary | ICD-10-CM | POA: Diagnosis not present

## 2023-03-10 DIAGNOSIS — M533 Sacrococcygeal disorders, not elsewhere classified: Secondary | ICD-10-CM | POA: Insufficient documentation

## 2023-03-10 DIAGNOSIS — M545 Low back pain, unspecified: Secondary | ICD-10-CM | POA: Diagnosis present

## 2023-03-10 DIAGNOSIS — M9904 Segmental and somatic dysfunction of sacral region: Secondary | ICD-10-CM | POA: Insufficient documentation

## 2023-03-10 DIAGNOSIS — M461 Sacroiliitis, not elsewhere classified: Secondary | ICD-10-CM | POA: Insufficient documentation

## 2023-03-10 DIAGNOSIS — I10 Essential (primary) hypertension: Secondary | ICD-10-CM | POA: Insufficient documentation

## 2023-03-10 DIAGNOSIS — M47898 Other spondylosis, sacral and sacrococcygeal region: Secondary | ICD-10-CM | POA: Insufficient documentation

## 2023-03-10 DIAGNOSIS — Z5189 Encounter for other specified aftercare: Secondary | ICD-10-CM

## 2023-03-10 MED ORDER — MIDAZOLAM HCL 5 MG/5ML IJ SOLN: 0.5000 mg | Freq: Once | INTRAMUSCULAR | Status: DC

## 2023-03-10 MED ORDER — METHYLPREDNISOLONE ACETATE 80 MG/ML IJ SUSP
80.0000 mg | Freq: Once | INTRAMUSCULAR | Status: AC
  Administered 2023-03-10: 80 mg via INTRA_ARTICULAR

## 2023-03-10 MED ORDER — LACTATED RINGERS IV SOLN: Freq: Once | INTRAVENOUS | Status: AC

## 2023-03-10 MED ORDER — LIDOCAINE HCL 2 % IJ SOLN
20.0000 mL | Freq: Once | INTRAMUSCULAR | Status: AC
  Administered 2023-03-10: 400 mg

## 2023-03-10 MED ORDER — METHYLPREDNISOLONE ACETATE 80 MG/ML IJ SUSP
INTRAMUSCULAR | Status: AC
  Filled 2023-03-10: qty 1

## 2023-03-10 MED ORDER — MIDAZOLAM HCL 5 MG/5ML IJ SOLN
INTRAMUSCULAR | Status: AC
  Filled 2023-03-10: qty 5

## 2023-03-10 MED ORDER — FENTANYL CITRATE (PF) 100 MCG/2ML IJ SOLN
INTRAMUSCULAR | Status: AC
  Filled 2023-03-10: qty 2

## 2023-03-10 MED ORDER — PENTAFLUOROPROP-TETRAFLUOROETH EX AERO
INHALATION_SPRAY | Freq: Once | CUTANEOUS | Status: AC
  Administered 2023-03-10: 30 via TOPICAL
  Filled 2023-03-10: qty 116

## 2023-03-10 MED ORDER — ROPIVACAINE HCL 2 MG/ML IJ SOLN
INTRAMUSCULAR | Status: AC
  Filled 2023-03-10: qty 20

## 2023-03-10 MED ORDER — ROPIVACAINE HCL 2 MG/ML IJ SOLN
9.0000 mL | Freq: Once | INTRAMUSCULAR | Status: AC
  Administered 2023-03-10: 9 mL via INTRA_ARTICULAR

## 2023-03-10 MED ORDER — FENTANYL CITRATE (PF) 100 MCG/2ML IJ SOLN: 25.0000 ug | INTRAMUSCULAR | Status: DC | PRN

## 2023-03-10 MED ORDER — LIDOCAINE HCL 2 % IJ SOLN
INTRAMUSCULAR | Status: AC
  Filled 2023-03-10: qty 20

## 2023-03-10 NOTE — Progress Notes (Signed)
PROVIDER NOTE: Interpretation of information contained herein should be left to medically-trained personnel. Specific patient instructions are provided elsewhere under "Patient Instructions" section of medical record. This document was created in part using STT-dictation technology, any transcriptional errors that may result from this process are unintentional.  Patient: Brenda Simmons Type: Established DOB: 1975/11/04 MRN: [id number] [facility]CP: [facility]  Service: Procedure DOS: 03/10/2023 Setting: Ambulatory Location: Ambulatory outpatient facility Delivery: Face-to-face Provider: Oswaldo Done, MD Specialty: Interventional Pain Management Specialty designation: 09 Location: Outpatient facility Ref. Prov.: [facility]       Interventional Therapy   Procedure: Sacroiliac Joint Steroid Injection #1    Laterality: Bilateral     Level: PSIS (Posterior Superior Iliac Spine)  Imaging: Fluoroscopic guidance Anesthesia: Local anesthesia (1-2% Lidocaine) Anxiolysis: IV Versed 2.0 mg Sedation: Moderate Sedation Fentanyl 1 mL (50 mcg) DOS: 03/10/2023  Performed by: Oswaldo Done, MD  Purpose: Diagnostic/Therapeutic Indications: Sacroiliac joint pain in the lower back and hip area severe enough to impact quality of life or function. Rationale (medical necessity): procedure needed and proper for the diagnosis and/or treatment of Ms. Jacquet's medical symptoms and needs. 1. Chronic low back pain (1ry area of Pain) (Bilateral) (R>L) w/o sciatica   2. Chronic sacroiliac joint pain (Bilateral) (R>L)   3. Disorder of sacroiliac joints (Bilateral)   4. Osteoarthritis of sacroiliac joints (Bilateral) (HCC)   5. Somatic dysfunction of sacroiliac joints (Bilateral)   6. Other spondylosis, sacral and sacrococcygeal region    NAS-11 Pain score:   Pre-procedure: 9 /10   Post-procedure: 0-No pain/10     Target: Interarticular sacroiliac joint. Location:  Medial to the postero-medial edge of iliac spine. Region: Lumbosacral-sacrococcygeal. Approach: Inferior postero-medial percutaneous approach. Type of procedure: Percutaneous joint injection.  Position / Prep / Materials:  Position: Prone  Prep solution: DuraPrep (Iodine Povacrylex [0.7% available iodine] and Isopropyl Alcohol, 74% w/w) Prep Area: Entire posterior lumbosacral area  Materials:  Tray: Block Needle(s):  Type: Spinal  Gauge (G): 22  Length: 5-in Qty: 2  Pre-op H&P Assessment:  Ms. Tilly is a 47 y.o. (year old), female patient, seen today for interventional treatment. She  has a past surgical history that includes Other surgical history (Left, ankle surgery); Salpingectomy (Bilateral); Ankle surgery (Left); Breast cyst aspiration (Right, age 47); and Tubal ligation (Bilateral). Ms. Tena has a current medication list which includes the following prescription(s): albuterol, albuterol, aspirin-salicylamide-caffeine, cetirizine, fluoxetine, fluticasone, rightest gs550 blood glucose, ibuprofen, lisinopril, meclizine, montelukast, multivitamin with minerals, omeprazole, ondansetron, propranolol er, propranolol er, quetiapine, rightest gl300 lancets, sitagliptin-metformin, sumatriptan, and gabapentin, and the following Facility-Administered Medications: fentanyl, lactated ringers, and midazolam. Her primarily concern today is the Back Pain  Initial Vital Signs:  Pulse/HCG Rate: 74ECG Heart Rate: 74 (NSR) Temp: (!) 97.2 F (36.2 C) Resp: 16 BP: (!) 128/90 SpO2: 100 %  BMI: Estimated body mass index is 37.59 kg/m as calculated from the following:   Height as of this encounter: 5\' 4"  (1.626 m).   Weight as of this encounter: 219 lb (99.3 kg).  Risk Assessment: Allergies: Reviewed. She has No Known Allergies.  Allergy Precautions: None required Coagulopathies: Reviewed. None identified.  Blood-thinner therapy: None at this time Active Infection(s): Reviewed. None  identified. Ms. Klosowski is afebrile  Site Confirmation: Ms. Coffie was asked to confirm the procedure and laterality before marking the site Procedure checklist: Completed Consent: Before the procedure and under the influence of no sedative(s), amnesic(s), or anxiolytics, the patient was informed of  the treatment options, risks and possible complications. To fulfill our ethical and legal obligations, as recommended by the American Medical Association's Code of Ethics, I have informed the patient of my clinical impression; the nature and purpose of the treatment or procedure; the risks, benefits, and possible complications of the intervention; the alternatives, including doing nothing; the risk(s) and benefit(s) of the alternative treatment(s) or procedure(s); and the risk(s) and benefit(s) of doing nothing. The patient was provided information about the general risks and possible complications associated with the procedure. These may include, but are not limited to: failure to achieve desired goals, infection, bleeding, organ or nerve damage, allergic reactions, paralysis, and death. In addition, the patient was informed of those risks and complications associated to the procedure, such as failure to decrease pain; infection; bleeding; organ or nerve damage with subsequent damage to sensory, motor, and/or autonomic systems, resulting in permanent pain, numbness, and/or weakness of one or several areas of the body; allergic reactions; (i.e.: anaphylactic reaction); and/or death. Furthermore, the patient was informed of those risks and complications associated with the medications. These include, but are not limited to: allergic reactions (i.e.: anaphylactic or anaphylactoid reaction(s)); adrenal axis suppression; blood sugar elevation that in diabetics may result in ketoacidosis or comma; water retention that in patients with history of congestive heart failure may result in shortness of breath, pulmonary  edema, and decompensation with resultant heart failure; weight gain; swelling or edema; medication-induced neural toxicity; particulate matter embolism and blood vessel occlusion with resultant organ, and/or nervous system infarction; and/or aseptic necrosis of one or more joints. Finally, the patient was informed that Medicine is not an exact science; therefore, there is also the possibility of unforeseen or unpredictable risks and/or possible complications that may result in a catastrophic outcome. The patient indicated having understood very clearly. We have given the patient no guarantees and we have made no promises. Enough time was given to the patient to ask questions, all of which were answered to the patient's satisfaction. Ms. Eklof has indicated that she wanted to continue with the procedure. Attestation: I, the ordering provider, attest that I have discussed with the patient the benefits, risks, side-effects, alternatives, likelihood of achieving goals, and potential problems during recovery for the procedure that I have provided informed consent. Date  Time: 03/10/2023  9:35 AM  Pre-Procedure Preparation:  Monitoring: As per clinic protocol. Respiration, ETCO2, SpO2, BP, heart rate and rhythm monitor placed and checked for adequate function Safety Precautions: Patient was assessed for positional comfort and pressure points before starting the procedure. Time-out: I initiated and conducted the "Time-out" before starting the procedure, as per protocol. The patient was asked to participate by confirming the accuracy of the "Time Out" information. Verification of the correct person, site, and procedure were performed and confirmed by me, the nursing staff, and the patient. "Time-out" conducted as per Joint Commission's Universal Protocol (UP.01.01.01). Time: 1055 Start Time: 1055 hrs.  Description/Narrative of Procedure:          Start Time: 1055 hrs.  Rationale (medical necessity):  procedure needed and proper for the diagnosis and/or treatment of the patient's medical symptoms and needs. Procedural Technique Safety Precautions: Aspiration looking for blood return was conducted prior to all injections. At no point did we inject any substances, as a needle was being advanced. No attempts were made at seeking any paresthesias. Safe injection practices and needle disposal techniques used. Medications properly checked for expiration dates. SDV (single dose vial) medications used. Description of the Procedure:  Protocol guidelines were followed. The patient was assisted into a comfortable position. The target area was identified and the area prepped in the usual manner. Skin & deeper tissues infiltrated with local anesthetic. Appropriate amount of time allowed to pass for local anesthetics to take effect. The procedure needles were then advanced to the target area. Proper needle placement secured. Negative aspiration confirmed. Solution injected in intermittent fashion, asking for systemic symptoms every 0.5cc of injectate. The needles were then removed and the area cleansed, making sure to leave some of the prepping solution back to take advantage of its long term bactericidal properties.  Technical description of procedure:  Fluoroscopy using a posterior anterior 45 degree angle from the midline aiming at the anterolateral aspect of the patient was used to find a direct path into the sacroiliac joint, the superior medial to posterior superior iliac spine.  The skin was marked where the desired target and the skin infiltrated with local anesthetics.  The procedure needle was then advanced until the joint was entered.  Once inside of the joint, we then proceeded to inject the desired solution.  Vitals:   03/10/23 1058 03/10/23 1108 03/10/23 1118 03/10/23 1128  BP: 116/83 111/69 114/70 117/69  Pulse:      Resp: 17 13 11 14   Temp:      SpO2: 99% 94% 97% 99%  Weight:      Height:          End Time: 1058 hrs.  Imaging Guidance (Non-Spinal):          Type of Imaging Technique: Fluoroscopy Guidance (Non-Spinal) Indication(s): Assistance in needle guidance and placement for procedures requiring needle placement in or near specific anatomical locations not easily accessible without such assistance. Exposure Time: Please see nurses notes. Contrast: None used. Fluoroscopic Guidance: I was personally present during the use of fluoroscopy. "Tunnel Vision Technique" used to obtain the best possible view of the target area. Parallax error corrected before commencing the procedure. "Direction-depth-direction" technique used to introduce the needle under continuous pulsed fluoroscopy. Once target was reached, antero-posterior, oblique, and lateral fluoroscopic projection used confirm needle placement in all planes. Images permanently stored in EMR. Interpretation: No contrast injected. I personally interpreted the imaging intraoperatively. Adequate needle placement confirmed in multiple planes. Permanent images saved into the patient's record.  Post-operative Assessment:  Post-procedure Vital Signs:  Pulse/HCG Rate: 7471 Temp: (!) 97.2 F (36.2 C) Resp: 14 BP: 117/69 SpO2: 99 %  EBL: None  Complications: No immediate post-treatment complications observed by team, or reported by patient.  Note: The patient tolerated the entire procedure well. A repeat set of vitals were taken after the procedure and the patient was kept under observation following institutional policy, for this type of procedure. Post-procedural neurological assessment was performed, showing return to baseline, prior to discharge. The patient was provided with post-procedure discharge instructions, including a section on how to identify potential problems. Should any problems arise concerning this procedure, the patient was given instructions to immediately contact us, at any time, without hesitation. In any case, we  plan to contact the patient by telephone for a follow-up status report regarding this interventional procedure.  Comments:  No additional relevant information.  Plan of Care (POC)  Orders:  Orders Placed This Encounter  Procedures   SACROILIAC JOINT INJECTION    Scheduling Instructions:     Side: Bilateral     Sedation: Patient's choice.     Timeframe: Today    Order Specific Question:   Where will  this procedure be performed?    Answer:   [facility]   DG [facility] C-ARM 1-60 MIN NO REPORT    Intraoperative interpretation by procedural physician at [facility].    Standing Status:   Standing    Number of Occurrences:   1    Order Specific Question:   Reason for exam:    Answer:   Assistance in needle guidance and placement for procedures requiring needle placement in or near specific anatomical locations not easily accessible without such assistance.   Informed Consent Details: Physician/Practitioner Attestation; Transcribe to consent form and obtain patient signature    Nursing Order: Transcribe to consent form and obtain patient signature. Note: Always confirm laterality of pain with Ms. Schachter, before procedure.    Order Specific Question:   Physician/Practitioner attestation of informed consent for procedure/surgical case    Answer:   I, the physician/practitioner, attest that I have discussed with the patient the benefits, risks, side effects, alternatives, likelihood of achieving goals and potential problems during recovery for the procedure that I have provided informed consent.    Order Specific Question:   Procedure    Answer:   Sacroiliac Joint Block    Order Specific Question:   Physician/Practitioner performing the procedure    Answer:    A. Laban Emperor, MD    Order Specific Question:   Indication/Reason    Answer:   Chronic Low Back and Hip Pain secondary to Sacroiliac Joint Pain (Arthralgia/Arthropathy)   Provide equipment / supplies at  bedside    Procedure tray: "Block Tray" (Disposable  single use) Skin infiltration needle: Regular 1.5-in, 25-G, (x1) Block Needle type: Spinal Amount/quantity: 2 Size: Medium (5-inch) Gauge: 22G    Standing Status:   Standing    Number of Occurrences:   1    Order Specific Question:   Specify    Answer:   Block Tray   Follow-up    Schedule Ms. Grays for a post-procedure follow-up evaluation encounter 2 weeks from now.    Standing Status:   Future    Standing Expiration Date:   03/24/2023    Scheduling Instructions:     Schedule follow-up visit on afternoon of procedure day (T, Th)     Type: Face-to-face (F2F) Post-procedure (PP) evaluation (E/M)     When: 2 weeks from now   Chronic Opioid Analgesic:  None MME/day: 0 mg/day   Medications ordered for procedure: Meds ordered this encounter  Medications   lidocaine (XYLOCAINE) 2 % (with pres) injection 400 mg   pentafluoroprop-tetrafluoroeth (GEBAUERS) aerosol   lactated ringers infusion   midazolam (VERSED) 5 MG/5ML injection 0.5-2 mg    Make sure Flumazenil is available in the pyxis when using this medication. If oversedation occurs, administer 0.2 mg IV over 15 sec. If after 45 sec no response, administer 0.2 mg again over 1 min; may repeat at 1 min intervals; not to exceed 4 doses (1 mg)   fentaNYL (SUBLIMAZE) injection 25-50 mcg    Make sure Narcan is available in the pyxis when using this medication. In the event of respiratory depression (RR< 8/min): Titrate NARCAN (naloxone) in increments of 0.1 to 0.2 mg IV at 2-3 minute intervals, until desired degree of reversal.   ropivacaine (PF) 2 mg/mL (0.2%) (NAROPIN) injection 9 mL   methylPREDNISolone acetate (DEPO-MEDROL) injection 80 mg   Medications administered: We administered lidocaine, pentafluoroprop-tetrafluoroeth, lactated ringers, ropivacaine (PF) 2 mg/mL (0.2%), and methylPREDNISolone acetate.  See the medical record for exact dosing,  route, and time of  administration.  Follow-up plan:   Return in about 2 weeks (around 03/24/2023) for Proc-day (T,Th), (Face2F), (PPE).       Interventional Therapies  Risk Factors  Considerations:  MO  T2NIDDM  HTN  Anxiety   Planned  Pending:   Diagnostic bilateral sacroiliac joint Blk #1     Under consideration:   Diagnostic bilateral (L4-5, L5-S1) lumbar facet (L3-S1) MBB #2  Possible bilateral lumbar facet RFA #1  Diagnostic bilateral SI joint Blk #1  Diagnostic bilateral IA hip joint injection #1  Diagnostic bilateral IA knee joint injection #1  Diagnostic bilateral cervical facet MBB #1    Completed:   Diagnostic bilateral (L4-5, L5-S1) lumbar facet (L3-S1) MBB x1    Completed by other providers:   None at this time   Therapeutic  Palliative (PRN) options:   None established       Recent Visits Date Type Provider Dept  01/27/23 Office Visit Delano Metz, MD [facility]  12/30/22 Procedure visit Delano Metz, MD [facility]  Showing recent visits within past 90 days and meeting all other requirements Today's Visits Date Type Provider Dept  03/10/23 Procedure visit Delano Metz, MD [facility]  Showing today's visits and meeting all other requirements Future Appointments Date Type Provider Dept  03/29/23 Appointment Delano Metz, MD [facility]  Showing future appointments within next 90 days and meeting all other requirements  Disposition: Discharge home  Discharge (Date  Time): 03/10/2023; 1130 hrs.   Primary Care Physician: [facility] Location: [facility] Note by: Oswaldo Done, MD (TTS technology used. I apologize for any typographical errors that were not detected and corrected.) Date: 03/10/2023; Time: 11:45 AM  Disclaimer:  Medicine is not an Visual merchandiser. The only guarantee in medicine is that nothing is guaranteed. It is important to  note that the decision to proceed with this intervention was based on the information collected from the patient. The Data and conclusions were drawn from the patient's questionnaire, the interview, and the physical examination. Because the information was provided in large part by the patient, it cannot be guaranteed that it has not been purposely or unconsciously manipulated. Every effort has been made to obtain as much relevant data as possible for this evaluation. It is important to note that the conclusions that lead to this procedure are derived in large part from the available data. Always take into account that the treatment will also be dependent on availability of resources and existing treatment guidelines, considered by other Pain Management Practitioners as being common knowledge and practice, at the time of the intervention. For Medico-Legal purposes, it is also important to point out that variation in procedural techniques and pharmacological choices are the acceptable norm. The indications, contraindications, technique, and results of the above procedure should only be interpreted and judged by a Board-Certified Interventional Pain Specialist with extensive familiarity and expertise in the same exact procedure and technique.

## 2023-03-10 NOTE — Patient Instructions (Signed)

## 2023-03-11 ENCOUNTER — Telehealth: Payer: Self-pay

## 2023-03-11 NOTE — Telephone Encounter (Signed)
Called PP. Denies any needs at this time. Instructed to call if needed. 

## 2023-03-16 ENCOUNTER — Ambulatory Visit (INDEPENDENT_AMBULATORY_CARE_PROVIDER_SITE_OTHER): Payer: Medicaid Other

## 2023-03-16 ENCOUNTER — Ambulatory Visit: Payer: Medicaid Other | Attending: Cardiology | Admitting: Cardiology

## 2023-03-16 ENCOUNTER — Encounter: Payer: Self-pay | Admitting: Cardiology

## 2023-03-16 ENCOUNTER — Other Ambulatory Visit
Admission: RE | Admit: 2023-03-16 | Discharge: 2023-03-16 | Disposition: A | Discharge status: Home / Self Care | Payer: Medicaid Other | Source: Ambulatory Visit | Attending: Cardiology | Admitting: Cardiology

## 2023-03-16 VITALS — BP 140/90 | HR 68 | Ht 64.0 in | Wt 221.4 lb

## 2023-03-16 DIAGNOSIS — R072 Precordial pain: Secondary | ICD-10-CM | POA: Insufficient documentation

## 2023-03-16 DIAGNOSIS — I10 Essential (primary) hypertension: Secondary | ICD-10-CM | POA: Insufficient documentation

## 2023-03-16 DIAGNOSIS — F17200 Nicotine dependence, unspecified, uncomplicated: Secondary | ICD-10-CM | POA: Insufficient documentation

## 2023-03-16 DIAGNOSIS — R002 Palpitations: Secondary | ICD-10-CM | POA: Insufficient documentation

## 2023-03-16 LAB — BASIC METABOLIC PANEL
Anion gap: 7 (ref 5–15)
BUN: 10 mg/dL (ref 6–20)
CO2: 24 mmol/L (ref 22–32)
Calcium: 8.4 mg/dL — ABNORMAL LOW (ref 8.9–10.3)
Chloride: 104 mmol/L (ref 98–111)
Creatinine, Ser: 0.71 mg/dL (ref 0.44–1.00)
GFR, Estimated: >60 mL/min (ref >60)
Glucose, Bld: 186 mg/dL — ABNORMAL HIGH (ref 70–99)
Potassium: 4.1 mmol/L (ref 3.5–5.1)
Sodium: 135 mmol/L (ref 135–145)

## 2023-03-16 MED ORDER — METOPROLOL TARTRATE 100 MG PO TABS: 100.0000 mg | ORAL_TABLET | Freq: Once | ORAL | 0 refills | Status: DC

## 2023-03-16 NOTE — Patient Instructions (Signed)
Medication Instructions:   Your physician recommends that you continue on your current medications as directed. Please refer to the Current Medication list given to you today.  *If you need a refill on your cardiac medications before your next appointment, please call your pharmacy*   Lab Work:  Your physician recommends you go to the medical mall for labs - BMP  If you have labs (blood work) drawn today and your tests are completely normal, you will receive your results only by: MyChart Message (if you have MyChart) OR A paper copy in the mail If you have any lab test that is abnormal or we need to change your treatment, we will call you to review the results.   Testing/Procedures:  Your physician has requested that you have an echocardiogram. Echocardiography is a painless test that uses sound waves to create images of your heart. It provides your doctor with information about the size and shape of your heart and how well your heart's chambers and valves are working. This procedure takes approximately one hour. There are no restrictions for this procedure. Please do NOT wear cologne, perfume, aftershave, or lotions (deodorant is allowed). Please arrive 15 minutes prior to your appointment time.  2. Your physician has recommended that you wear a Zio monitor.   This monitor is a medical device that records the heart's electrical activity. Doctors most often use these monitors to diagnose arrhythmias. Arrhythmias are problems with the speed or rhythm of the heartbeat. The monitor is a small device applied to your chest. You can wear one while you do your normal daily activities. While wearing this monitor if you have any symptoms to push the button and record what you felt. Once you have worn this monitor for the period of time provider prescribed (Usually 14 days), you will return the monitor device in the postage paid box. Once it is returned they will download the data collected and  provide [place] with a report which the provider will then review and we will call you with those results. Important tips:  Avoid showering during the first 24 hours of wearing the monitor. Avoid excessive sweating to help maximize wear time. Do not submerge the device, no hot tubs, and no swimming pools. Keep any lotions or oils away from the patch. After 24 hours you may shower with the patch on. Take brief showers with your back facing the shower head.  Do not remove patch once it has been placed because that will interrupt data and decrease adhesive wear time. Push the button when you have any symptoms and write down what you were feeling. Once you have completed wearing your monitor, remove and place into box which has postage paid and place in your outgoing mailbox.  If for some reason you have misplaced your box then call our office and we can provide another box and/or mail it off for you.    Your cardiac CT will be scheduled at one of the below locations:   [facility] [address] [place], [place] [address] [phone]  OR   [facility] [address] [place], [place] [address] [phone]  If scheduled at [facility] or [facility], please arrive 15 mins early for check-in and test prep.   Please follow these instructions carefully (unless otherwise directed):  Hold all erectile dysfunction medications at least 3 days (72 hrs) prior to test. (Ie viagra, cialis, sildenafil, tadalafil, etc)  We will administer nitroglycerin during this exam.   On the Night Before the Test: Be sure to Drink plenty of water. Do not consume any caffeinated/decaffeinated beverages or chocolate 12 hours prior to your test. Do not take any antihistamines 12 hours prior to your test.  On the Day of the Test: Drink plenty of water until 1 hour prior to the test. Do not eat any  food 1 hour prior to test. You may take your regular medications prior to the test.  Take metoprolol (Lopressor) two hours prior to test. FEMALES- please wear underwire-free bra if available, avoid dresses & tight clothing      After the Test: Drink plenty of water. After receiving IV contrast, you may experience a mild flushed feeling. This is normal. On occasion, you may experience a mild rash up to 24 hours after the test. This is not dangerous. If this occurs, you can take Benadryl 25 mg and increase your fluid intake. If you experience trouble breathing, this can be serious. If it is severe call 911 IMMEDIATELY. If it is mild, please call our office. If you take any of these medications: Glipizide/Metformin, Avandament, Glucavance, please do not take 48 hours after completing test unless otherwise instructed.  For scheduling needs, including cancellations and rescheduling, please call [place], [phone].    Follow-Up: At [facility], you and your health needs are our priority.  As part of our continuing mission to provide you with exceptional heart care, we have created designated Provider Care Teams.  These Care Teams include your primary Cardiologist (physician) and Advanced Practice Providers (APPs -  Physician Assistants and Nurse Practitioners) who all work together to provide you with the care you need, when you need it.  We recommend signing up for the patient portal called "MyChart".  Sign up information is provided on this After Visit Summary.  MyChart is used to connect with patients for Virtual Visits (Telemedicine).  Patients are able to view lab/test results, encounter notes, upcoming appointments, etc.  Non-urgent messages can be sent to your provider as well.   To learn more about what you can do with MyChart, go to [email or web address].    Your next appointment:    After testing  Provider:   You may see Debbe Odea, MD or one of the following  Advanced Practice Providers on your designated Care Team:   Nicolasa Ducking, NP Eula Listen, PA-C Cadence Fransico Michael, PA-C Charlsie Quest, NP

## 2023-03-16 NOTE — Progress Notes (Signed)
Cardiology Office Note:    Date:  03/16/2023   ID:  Brenda Simmons, DOB 1975-11-26, MRN [id number]  PCP:  Sandrea Hughs, NP   [facility] Providers Cardiologist:  Debbe Odea, MD     Referring MD: [facility], [facility]   Chief Complaint  Patient presents with   New Patient (Initial Visit)    Seen by Dr. Kirke Corin in 2016 for abnormal EKG with follow up plan of PRN.  Here today for evaluation of intermittent chest pain with occasional SOBr.    History of Present Illness:    Brenda Simmons is a 47 y.o. female with a hx of hypertension, diabetes, anxiety, current smoker x 25+ years who presents with symptoms of chest pain and palpitations.  States having palpitations ongoing over the past 6 to 12 months.  Symptoms occur about 2-3 times a day lasting a few seconds.  Symptoms of palpitations are typically associated with dizziness.  She also endorses nonexertional chest pain occurring randomly.  Not related with food.  Denies trauma, denies reproducibility with chest palpation.  Past Medical History:  Diagnosis Date   Anxiety    Breast mass x 3 mos   felt by MD, pt does not feel it   Bulging lumbar disc    Seen [facility]   Chronic bronchitis [facility])    Depression    Seeing Mental Health Professional   Diabetes mellitus without complication (HCC)    Hypertension    Migraines    Partial tear of rotator cuff    Injections did not help   Pre-diabetes    On Metformin.    Past Surgical History:  Procedure Laterality Date   ANKLE SURGERY Left    BREAST CYST ASPIRATION Right age 49   OTHER SURGICAL HISTORY Left ankle surgery   SALPINGECTOMY Bilateral    TUBAL LIGATION Bilateral     Current Medications: Current Meds  Medication Sig   albuterol (PROAIR HFA) 108 (90 Base) MCG/ACT inhaler INHALE 2 PUFFS INTO THE LUNGS EVERY FOUR TO SIX HOURS AS NEEDED FOR WHEEZING.   Aspirin-Salicylamide-Caffeine (BC HEADACHE POWDER PO) Take 1 packet by mouth as  needed.   atorvastatin (LIPITOR) 20 MG tablet Take 20 mg by mouth daily.   cyclobenzaprine (FLEXERIL) 10 MG tablet Take 10 mg by mouth 3 (three) times daily.   escitalopram (LEXAPRO) 20 MG tablet Take 20 mg by mouth daily.   fluticasone (FLONASE) 50 MCG/ACT nasal spray Place 2 sprays into both nostrils once daily for allergies.   gabapentin (NEURONTIN) 300 MG capsule Take 1 capsule (300 mg total) by mouth 2 (two) times daily for 30 days. (Patient taking differently: Take 300 mg by mouth daily.)   glucose blood (RIGHTEST GS550 BLOOD GLUCOSE) test strip Use as directed   ibuprofen (ADVIL,MOTRIN) 600 MG tablet Take 600 mg by mouth every 6 (six) hours as needed for cramping.   lisinopril (ZESTRIL) 10 MG tablet Take 10 mg by mouth daily.   meclizine (ANTIVERT) 25 MG tablet Take by mouth.   metoprolol tartrate (LOPRESSOR) 100 MG tablet Take 1 tablet (100 mg total) by mouth once for 1 dose. TAKE TWO HOURS PRIOR TO CARDIAC CT   omeprazole (PRILOSEC) 20 MG capsule TAKE 1 CAPSULE BY MOUTH ONCE A DAY FOR REFLUX AND COUGH.   ondansetron (ZOFRAN-ODT) 4 MG disintegrating tablet DISSOLVE 2 TABLETS (8 MG) BY MOUTH ONCE EVERY 8 HOURS AS NEEDED FOR NAUSEA   propranolol ER (INDERAL LA) 120 MG 24 hr capsule  TAKE 1 CAPSULE BY MOUTH EVERY DAY AT BEDTIME.   Rightest GL300 Lancets MISC USE AS DIRECTED   sitaGLIPtin-metformin (JANUMET) 50-500 MG tablet Take 1 tablet by mouth 2 (two) times daily with a meal.   SUMAtriptan (IMITREX) 50 MG tablet SMARTSIG:1 Tablet(s) By Mouth   traZODone (DESYREL) 50 MG tablet Take 25-50 mg by mouth at bedtime as needed.   Vitamin D, Ergocalciferol, (DRISDOL) 1.25 MG (50000 UNIT) CAPS capsule Take 50,000 Units by mouth once a week.   [DISCONTINUED] albuterol (PROVENTIL HFA;VENTOLIN HFA) 108 (90 Base) MCG/ACT inhaler Inhale 2 puffs into the lungs every 6 (six) hours as needed for wheezing or shortness of breath.     Allergies:   Patient has no known allergies.   Social History    Socioeconomic History   Marital status: Single    Spouse name: Not on file   Number of children: Not on file   Years of education: Not on file   Highest education level: Not on file  Occupational History   Not on file  Tobacco Use   Smoking status: Every Day    Packs/day: 0.25    Years: 15.00    Additional pack years: 0.00    Total pack years: 3.75    Types: Cigarettes   Smokeless tobacco: Never   Tobacco comments:    cutting back  Vaping Use   Vaping Use: Never used  Substance and Sexual Activity   Alcohol use: Yes    Comment: Occasionally during Holidays   Drug use: No   Sexual activity: Yes    Birth control/protection: None  Other Topics Concern   Not on file  Social History Narrative   Not on file   Social Determinants of Health   Financial Resource Strain: Not on file  Food Insecurity: Not on file  Transportation Needs: Not on file  Physical Activity: Insufficiently Active (04/12/2019)   Exercise Vital Sign    Days of Exercise per Week: 4 days    Minutes of Exercise per Session: 10 min  Stress: Not on file  Social Connections: Not on file     Family History: The patient's family history includes Cancer in her maternal grandmother and paternal grandmother; Depression in her brother and mother; Diabetes in her mother; Heart failure in her mother; Hypertension in her father, mother, and sister. There is no history of Breast cancer.  ROS:   Please see the history of present illness.     All other systems reviewed and are negative.  EKGs/Labs/Other Studies Reviewed:    The following studies were reviewed today:     EKG obtained, normal sinus rhythm, normal ECG, heart rate 68  Recent Labs: 10/18/2022: BUN 7; Creatinine, Ser 0.86; Magnesium 1.7; Potassium 4.6; Sodium 143  Recent Lipid Panel    Component Value Date/Time   CHOL 166 07/30/2015 1221   TRIG 103 07/30/2015 1221   HDL 43 07/30/2015 1221   CHOLHDL 3.9 07/30/2015 1221   LDLCALC 102 (H)  07/30/2015 1221     Risk Assessment/Calculations:     HYPERTENSION CONTROL Vitals:   03/16/23 0924 03/16/23 0931  BP: 124/88 (!) 140/90    The patient's blood pressure is elevated above target today.  In order to address the patient's elevated BP: Blood pressure will be monitored at home to determine if medication changes need to be made.            Physical Exam:    VS:  BP (!) 140/90 (BP Location: Right Arm,  Patient Position: Sitting, Cuff Size: Large)   Pulse 68   Ht 5\' 4"  (1.626 m)   Wt 221 lb 6.4 oz (100.4 kg)   SpO2 99%   BMI 38.00 kg/m     Wt Readings from Last 3 Encounters:  03/16/23 221 lb 6.4 oz (100.4 kg)  03/10/23 219 lb (99.3 kg)  01/27/23 219 lb (99.3 kg)     GEN:  Well nourished, well developed in no acute distress HEENT: Normal NECK: No JVD; No carotid bruits CARDIAC: RRR, no murmurs, rubs, gallops RESPIRATORY:  Clear to auscultation without rales, wheezing or rhonchi  ABDOMEN: Soft, non-tender, non-distended MUSCULOSKELETAL:  No edema; No deformity  SKIN: Warm and dry NEUROLOGIC:  Alert and oriented x 3 PSYCHIATRIC:  Normal affect   ASSESSMENT:    1. Precordial pain   2. Palpitations   3. Primary hypertension   4. Smoking    PLAN:    In order of problems listed above:  Chest pain, several risk factors including hypertension, hyperlipidemia, current smoker.  Get echocardiogram, get coronary CTA. Palpitations, place cardiac monitor to evaluate any significant arrhythmias. Hypertension, BP elevated today, usually controlled.  Continue lisinopril 10 mg daily. Current smoker, smoking cessation advised.  Follow-up after cardiac testing      Medication Adjustments/Labs and Tests Ordered: Current medicines are reviewed at length with the patient today.  Concerns regarding medicines are outlined above.  Orders Placed This Encounter  Procedures   CT CORONARY MORPH W/CTA COR W/SCORE W/CA W/CM &/OR WO/CM   Basic Metabolic Panel (BMET)    LONG TERM MONITOR (3-14 DAYS)   EKG 12-Lead   ECHOCARDIOGRAM COMPLETE   Meds ordered this encounter  Medications   metoprolol tartrate (LOPRESSOR) 100 MG tablet    Sig: Take 1 tablet (100 mg total) by mouth once for 1 dose. TAKE TWO HOURS PRIOR TO CARDIAC CT    Dispense:  1 tablet    Refill:  0    Patient Instructions  Medication Instructions:   Your physician recommends that you continue on your current medications as directed. Please refer to the Current Medication list given to you today.  *If you need a refill on your cardiac medications before your next appointment, please call your pharmacy*   Lab Work:  Your physician recommends you go to the medical mall for labs - BMP  If you have labs (blood work) drawn today and your tests are completely normal, you will receive your results only by: MyChart Message (if you have MyChart) OR A paper copy in the mail If you have any lab test that is abnormal or we need to change your treatment, we will call you to review the results.   Testing/Procedures:  Your physician has requested that you have an echocardiogram. Echocardiography is a painless test that uses sound waves to create images of your heart. It provides your doctor with information about the size and shape of your heart and how well your heart's chambers and valves are working. This procedure takes approximately one hour. There are no restrictions for this procedure. Please do NOT wear cologne, perfume, aftershave, or lotions (deodorant is allowed). Please arrive 15 minutes prior to your appointment time.  2. Your physician has recommended that you wear a Zio monitor.   This monitor is a medical device that records the heart's electrical activity. Doctors most often use these monitors to diagnose arrhythmias. Arrhythmias are problems with the speed or rhythm of the heartbeat. The monitor is a small device applied  to your chest. You can wear one while you do your normal  daily activities. While wearing this monitor if you have any symptoms to push the button and record what you felt. Once you have worn this monitor for the period of time provider prescribed (Usually 14 days), you will return the monitor device in the postage paid box. Once it is returned they will download the data collected and provide [place] with a report which the provider will then review and we will call you with those results. Important tips:  Avoid showering during the first 24 hours of wearing the monitor. Avoid excessive sweating to help maximize wear time. Do not submerge the device, no hot tubs, and no swimming pools. Keep any lotions or oils away from the patch. After 24 hours you may shower with the patch on. Take brief showers with your back facing the shower head.  Do not remove patch once it has been placed because that will interrupt data and decrease adhesive wear time. Push the button when you have any symptoms and write down what you were feeling. Once you have completed wearing your monitor, remove and place into box which has postage paid and place in your outgoing mailbox.  If for some reason you have misplaced your box then call our office and we can provide another box and/or mail it off for you.    Your cardiac CT will be scheduled at one of the below locations:   [facility] [address] [place], [place] [address] [phone]  OR   [facility] [address] [place], [place] [address] [phone]  If scheduled at [facility] or [facility], please arrive 15 mins early for check-in and test prep.   Please follow these instructions carefully (unless otherwise directed):  Hold all erectile dysfunction medications at least 3 days (72 hrs) prior to test. (Ie viagra, cialis, sildenafil, tadalafil, etc) We will administer nitroglycerin during this  exam.   On the Night Before the Test: Be sure to Drink plenty of water. Do not consume any caffeinated/decaffeinated beverages or chocolate 12 hours prior to your test. Do not take any antihistamines 12 hours prior to your test.  On the Day of the Test: Drink plenty of water until 1 hour prior to the test. Do not eat any food 1 hour prior to test. You may take your regular medications prior to the test.  Take metoprolol (Lopressor) two hours prior to test. FEMALES- please wear underwire-free bra if available, avoid dresses & tight clothing      After the Test: Drink plenty of water. After receiving IV contrast, you may experience a mild flushed feeling. This is normal. On occasion, you may experience a mild rash up to 24 hours after the test. This is not dangerous. If this occurs, you can take Benadryl 25 mg and increase your fluid intake. If you experience trouble breathing, this can be serious. If it is severe call 911 IMMEDIATELY. If it is mild, please call our office. If you take any of these medications: Glipizide/Metformin, Avandament, Glucavance, please do not take 48 hours after completing test unless otherwise instructed.  For scheduling needs, including cancellations and rescheduling, please call [place], [phone].    Follow-Up: At [facility], you and your health needs are our priority.  As part of our continuing mission to provide you with exceptional heart care, we have created designated Provider Care Teams.  These Care Teams include your primary Cardiologist (physician) and Advanced Practice Providers (APPs -  Physician Assistants and Nurse Practitioners) who all work together to provide you with the care you need, when you need it.  We recommend signing up for the patient portal called "MyChart".  Sign up information is provided on this After Visit Summary.  MyChart is used to connect with patients for Virtual Visits (Telemedicine).  Patients are able to  view lab/test results, encounter notes, upcoming appointments, etc.  Non-urgent messages can be sent to your provider as well.   To learn more about what you can do with MyChart, go to [email or web address].    Your next appointment:    After testing  Provider:   You may see Debbe Odea, MD or one of the following Advanced Practice Providers on your designated Care Team:   Nicolasa Ducking, NP Eula Listen, PA-C Cadence Fransico Michael, PA-C Charlsie Quest, NP   Signed, Debbe Odea, MD  03/16/2023 9:59 AM    [facility]

## 2023-03-21 DIAGNOSIS — R072 Precordial pain: Secondary | ICD-10-CM

## 2023-03-21 DIAGNOSIS — R002 Palpitations: Secondary | ICD-10-CM | POA: Diagnosis not present

## 2023-03-23 ENCOUNTER — Ambulatory Visit: Payer: Medicaid Other | Attending: Cardiology

## 2023-03-23 DIAGNOSIS — R072 Precordial pain: Secondary | ICD-10-CM | POA: Diagnosis present

## 2023-03-23 LAB — ECHOCARDIOGRAM COMPLETE
Area-P 1/2: 3.85 cm2
S' Lateral: 2.9 cm

## 2023-03-28 NOTE — Progress Notes (Unsigned)
PROVIDER NOTE: Information contained herein reflects review and annotations entered in association with encounter. Interpretation of such information and data should be left to medically-trained personnel. Information provided to patient can be located elsewhere in the medical record under "Patient Instructions". Document created using STT-dictation technology, any transcriptional errors that may result from process are unintentional.    Patient: Brenda Simmons  Service Category: E/M  Provider: Oswaldo Done, MD  DOB: 1975-09-29  DOS: 03/29/2023  Referring Provider: Center, Darcella Gasman*  MRN: [id number]  Specialty: Interventional Pain Management  PCP: Sandrea Hughs, NP  Type: Established Patient  Setting: Ambulatory outpatient    Location: Office  Delivery: Face-to-face     HPI  [name], a 47 y.o. year old female, is here today because of her No primary diagnosis found.. [name] primary complain today is No chief complaint on file.  Pertinent problems: [name] has Disorder of rotator cuff; Chronic neck pain (2ry area of Pain) (Bilateral) (R>L); Chronic right shoulder pain; Migraine headache without aura; Migrainous dizziness; Right-sided low back pain with right-sided sciatica; Bilateral lower extremity pain; Chronic tension-type headache, not intractable; Herniated cervical disc; Osteoarthritis of spine with radiculopathy, cervical region; Right rotator cuff tear; Shoulder tendinitis, right; Abnormal CT scan, cervical spine (01/24/2022); DDD (degenerative disc disease), cervical (C5-C7); Chronic pain syndrome; Chronic low back pain (1ry area of Pain) (Bilateral) (R>L) w/o sciatica; Chronic hip pain (3ry area of Pain) (Bilateral) (R>L); Chronic knee pain (4th area of Pain) (Bilateral) (R>L); Chronic lower extremity pain (5th area of Pain) (Bilateral) (L>R); History of ankle surgery old (2006) (Left); Numbness of lower extremity (Intermittent) (Bilateral); DDD (degenerative  disc disease), lumbosacral; Osteoarthritis of knee (Right); Osteoarthritis of hips (Bilateral); Osteoarthritis of sacroiliac joints (Bilateral) (HCC); Cervical facet hypertrophy (Multilevel) (Bilateral); Cervical foraminal stenosis  (Right: C3-4) (Bilateral: C4-5, C5-6) (Left: C6-7); Grade 1 (5 mm) anterolisthesis of lumbar spine (L4/L5); Lumbar facet joint arthropathy (L4-5); Lumbosacral facet joint syndrome; Spondylosis without myelopathy or radiculopathy, lumbosacral region; Lumbar facet joint pain; Chronic sacroiliac joint pain (Bilateral) (R>L); Disorder of sacroiliac joints (Bilateral); Somatic dysfunction of sacroiliac joints (Bilateral); and Other spondylosis, sacral and sacrococcygeal region on their pertinent problem list. Pain Assessment: Severity of   is reported as a  /10. Location:    / . Onset:  . Quality:  . Timing:  . Modifying factor(s):  Marland Kitchen Vitals:  vitals were not taken for this visit.  BMI: Estimated body mass index is 38 kg/m as calculated from the following:   Height as of 03/16/23: 5\' 4"  (1.626 m).   Weight as of 03/16/23: 221 lb 6.4 oz (100.4 kg). Last encounter: 01/27/2023. Last procedure: 03/10/2023.  Reason for encounter: post-procedure evaluation and assessment. ***  Post-procedure evaluation   Procedure: Sacroiliac Joint Steroid Injection #1    Laterality: Bilateral     Level: PSIS (Posterior Superior Iliac Spine)  Imaging: Fluoroscopic guidance Anesthesia: Local anesthesia (1-2% Lidocaine) Anxiolysis: IV Versed 2.0 mg Sedation: Moderate Sedation Fentanyl 1 mL (50 mcg) DOS: 03/10/2023  Performed by: Oswaldo Done, MD  Purpose: Diagnostic/Therapeutic Indications: Sacroiliac joint pain in the lower back and hip area severe enough to impact quality of life or function. Rationale (medical necessity): procedure needed and proper for the diagnosis and/or treatment of Brenda Simmons medical symptoms and needs. 1. Chronic low back pain (1ry area of Pain) (Bilateral)  (R>L) w/o sciatica   2. Chronic sacroiliac joint pain (Bilateral) (R>L)   3. Disorder of sacroiliac joints (Bilateral)   4.  Osteoarthritis of sacroiliac joints (Bilateral) (HCC)   5. Somatic dysfunction of sacroiliac joints (Bilateral)   6. Other spondylosis, sacral and sacrococcygeal region    NAS-11 Pain score:   Pre-procedure: 9 /10   Post-procedure: 0-No pain/10      Effectiveness:  Initial hour after procedure:   ***. Subsequent 4-6 hours post-procedure:   ***. Analgesia past initial 6 hours:   ***. Ongoing improvement:  Analgesic:  *** Function:    ***    ROM:    ***     Pharmacotherapy Assessment  Analgesic: None MME/day: 0 mg/day   Monitoring: [place] PMP: PDMP reviewed during this encounter.       Pharmacotherapy: No side-effects or adverse reactions reported. Compliance: No problems identified. Effectiveness: Clinically acceptable.  No notes on file  No results found for: "CBDTHCR" No results found for: "D8THCCBX" No results found for: "D9THCCBX"  UDS:  Summary  Date Value Ref Range Status  10/18/2022 Note  Final    Comment:    ==================================================================== Compliance Drug Analysis, Ur ==================================================================== Test                             Result       Flag       Units  Drug Present and Declared for Prescription Verification   Gabapentin                     PRESENT      EXPECTED   Salicylate                     PRESENT      EXPECTED   Propranolol                    PRESENT      EXPECTED  Drug Present not Declared for Prescription Verification   Naproxen                       PRESENT      UNEXPECTED  Drug Absent but Declared for Prescription Verification   Fluoxetine                     Not Detected UNEXPECTED   Quetiapine                     Not Detected UNEXPECTED   Ibuprofen                      Not Detected UNEXPECTED    Ibuprofen, as indicated in the declared  medication list, is not    always detected even when used as directed.  ==================================================================== Test                      Result    Flag   Units      Ref Range   Creatinine              139              mg/dL      >=57 ==================================================================== Declared Medications:  The flagging and interpretation on this report are based on the  following declared medications.  Unexpected results may arise from  inaccuracies in the declared medications.   **Note: The testing scope of this panel includes these medications:   Fluoxetine (Prozac)  Gabapentin (Neurontin)  Propranolol  Quetiapine (Seroquel)   **Note: The testing scope of this panel does not include small to  moderate amounts of these reported medications:   Aspirin  Ibuprofen (Advil)  Salicylate (Salicylamide)   **Note: The testing scope of this panel does not include the  following reported medications:   Albuterol  Caffeine  Cetirizine (Zyrtec)  Fluticasone (Flonase)  Metformin (Janumet)  Montelukast  Multivitamin  Omeprazole  Ondansetron  Sitagliptin (Janumet) ==================================================================== For clinical consultation, please call [phone]. ====================================================================       ROS  Constitutional: Denies any fever or chills Gastrointestinal: No reported hemesis, hematochezia, vomiting, or acute GI distress Musculoskeletal: Denies any acute onset joint swelling, redness, loss of ROM, or weakness Neurological: No reported episodes of acute onset apraxia, aphasia, dysarthria, agnosia, amnesia, paralysis, loss of coordination, or loss of consciousness  Medication Review  Aspirin-Salicylamide-Caffeine, FLUoxetine, QUEtiapine, Rightest GL300 Lancets, SUMAtriptan, Vitamin D (Ergocalciferol), albuterol, atorvastatin, cetirizine, cyclobenzaprine,  escitalopram, fluticasone, gabapentin, glucose blood, ibuprofen, lisinopril, meclizine, metoprolol tartrate, montelukast, multivitamin with minerals, omeprazole, ondansetron, propranolol ER, sitaGLIPtin-metformin, and traZODone  History Review  Allergy: Brenda Simmons has No Known Allergies. Drug: Brenda Simmons  reports no history of drug use. Alcohol:  reports current alcohol use. Tobacco:  reports that she has been smoking cigarettes. She has a 3.75 pack-year smoking history. She has never used smokeless tobacco. Social: Brenda Simmons  reports that she has been smoking cigarettes. She has a 3.75 pack-year smoking history. She has never used smokeless tobacco. She reports current alcohol use. She reports that she does not use drugs. Medical:  has a past medical history of Anxiety, Breast mass (x 3 mos), Bulging lumbar disc, Chronic bronchitis (HCC), Depression, Diabetes mellitus without complication (HCC), Hypertension, Migraines, Partial tear of rotator cuff, and Pre-diabetes. Surgical: Brenda Simmons  has a past surgical history that includes Other surgical history (Left, ankle surgery); Salpingectomy (Bilateral); Ankle surgery (Left); Breast cyst aspiration (Right, age 79); and Tubal ligation (Bilateral). Family: family history includes Cancer in her maternal grandmother and paternal grandmother; Depression in her brother and mother; Diabetes in her mother; Heart failure in her mother; Hypertension in her father, mother, and sister.  Laboratory Chemistry Profile   Renal Lab Results  Component Value Date   BUN 10 03/16/2023   CREATININE 0.71 03/16/2023   BCR 8 (L) 10/18/2022   GFRAA >60 07/28/2016   GFRNONAA >60 03/16/2023    Hepatic Lab Results  Component Value Date   AST 14 10/18/2022   ALT 11 (L) 07/28/2016   ALBUMIN 3.8 (L) 10/18/2022   ALKPHOS 114 10/18/2022   LIPASE 103 09/17/2014    Electrolytes Lab Results  Component Value Date   NA 135 03/16/2023   K 4.1 03/16/2023   CL 104  03/16/2023   CALCIUM 8.4 (L) 03/16/2023   MG 1.7 10/18/2022    Bone Lab Results  Component Value Date   VD25OH 23.0 (L) 12/04/2015   25OHVITD1 9.2 (L) 10/18/2022   25OHVITD2 <1.0 10/18/2022   25OHVITD3 9.1 10/18/2022    Inflammation (CRP: Acute Phase) (ESR: Chronic Phase) Lab Results  Component Value Date   CRP 14 (H) 10/18/2022   ESRSEDRATE 7 10/18/2022         Note: Above Lab results reviewed.  Recent Imaging Review  ECHOCARDIOGRAM COMPLETE    ECHOCARDIOGRAM REPORT       Patient Name:   DAISSY BARTOS Date of Exam: 03/23/2023 Medical Rec #:  [id number]        Height:  64.0 in Accession #:    [id number]       Weight:       221.4 lb Date of Birth:  August 31, 1976        BSA:          2.043 m Patient Age:    47 years         BP:           140/90 mmHg Patient Gender: F                HR:           60 bpm. Exam Location:  [facility]  Procedure: 2D Echo, 3D Echo, Cardiac Doppler, Color Doppler and Strain Analysis  Indications:    R07.9* Chest pain, unspecified   History:        Patient has no prior history of Echocardiogram examinations.                 Signs/Symptoms:Chest Pain and Shortness of Breath; Risk                 Factors:Current Smoker, Hypertension and Diabetes.   Sonographer:    Ilda Mori RDCS, MHA Referring Phys: [id number] BRIAN AGBOR-ETANG  IMPRESSIONS   1. Left ventricular ejection fraction, by estimation, is 55 to 60%. The left ventricle has normal function. The left ventricle has no regional wall motion abnormalities. There is mild left ventricular hypertrophy. Left ventricular diastolic parameters  were normal. The average left ventricular global longitudinal strain is -17.6 %. The global longitudinal strain is normal.  2. Right ventricular systolic function is normal. The right ventricular size is normal.  3. The mitral valve is normal in structure. No evidence of mitral valve regurgitation.  4. The aortic valve is normal in structure. Aortic  valve regurgitation is not visualized.  5. The inferior vena cava is normal in size with greater than 50% respiratory variability, suggesting right atrial pressure of 3 mmHg.  FINDINGS  Left Ventricle: Left ventricular ejection fraction, by estimation, is 55 to 60%. The left ventricle has normal function. The left ventricle has no regional wall motion abnormalities. The average left ventricular global longitudinal strain is -17.6 %.  The global longitudinal strain is normal. The left ventricular internal cavity size was normal in size. There is mild left ventricular hypertrophy. Left ventricular diastolic parameters were normal.  Right Ventricle: The right ventricular size is normal. No increase in right ventricular wall thickness. Right ventricular systolic function is normal.  Left Atrium: Left atrial size was normal in size.  Right Atrium: Right atrial size was normal in size.  Pericardium: There is no evidence of pericardial effusion.  Mitral Valve: The mitral valve is normal in structure. No evidence of mitral valve regurgitation.  Tricuspid Valve: The tricuspid valve is normal in structure. Tricuspid valve regurgitation is mild.  Aortic Valve: The aortic valve is normal in structure. Aortic valve regurgitation is not visualized.  Pulmonic Valve: The pulmonic valve was normal in structure. Pulmonic valve regurgitation is trivial.  Aorta: The aortic root and ascending aorta are structurally normal, with no evidence of dilitation.  Venous: The inferior vena cava is normal in size with greater than 50% respiratory variability, suggesting right atrial pressure of 3 mmHg.  IAS/Shunts: No atrial level shunt detected by color flow Doppler.    LEFT VENTRICLE PLAX 2D LVIDd:         4.40 cm Diastology LVIDs:         2.90 cm LV e'  medial:    7.29 cm/s LV PW:         1.30 cm LV E/e' medial:  5.7 LV IVS:        1.30 cm LV e' lateral:   9.25 cm/s                        LV E/e' lateral:  4.5                          2D Longitudinal Strain                        2D Strain GLS Avg:     -17.6 %                          3D Volume EF:                        3D EF:        56 %                        LV EDV:       88 ml                        LV ESV:       39 ml                        LV SV:        49 ml  RIGHT VENTRICLE RV S prime:     12.80 cm/s TAPSE (M-mode): 1.9 cm  LEFT ATRIUM             Index LA diam:        3.60 cm 1.76 cm/m LA Vol (A2C):   38.8 ml 18.99 ml/m LA Vol (A4C):   46.6 ml 22.81 ml/m LA Biplane Vol: 44.8 ml 21.93 ml/m    AORTA Ao Asc diam: 2.70 cm  MITRAL VALVE MV Area (PHT): 3.85 cm MV Decel Time: 197 msec MV E velocity: 41.60 cm/s MV A velocity: 48.70 cm/s MV E/A ratio:  0.85  Debbe Odea MD Electronically signed by Debbe Odea MD Signature Date/Time: 03/23/2023/4:39:35 PM      Final   Note: Reviewed        Physical Exam  General appearance: Well nourished, well developed, and well hydrated. In no apparent acute distress Mental status: Alert, oriented x 3 (person, place, & time)       Respiratory: No evidence of acute respiratory distress Eyes: PERLA Vitals: There were no vitals taken for this visit. BMI: Estimated body mass index is 38 kg/m as calculated from the following:   Height as of 03/16/23: 5\' 4"  (1.626 m).   Weight as of 03/16/23: 221 lb 6.4 oz (100.4 kg). Ideal: Ideal body weight: 54.7 kg (120 lb 9.5 oz) Adjusted ideal body weight: 73 kg (160 lb 14.6 oz)  Assessment   Diagnosis Status  No diagnosis found. Controlled Controlled Controlled   Updated Problems: No problems updated.  Plan of Care  Problem-specific:  No problem-specific Assessment & Plan notes found for this encounter.  Brenda Simmons has a current medication list which includes the following long-term medication(s): albuterol, cetirizine, escitalopram, fluoxetine, fluticasone, gabapentin, lisinopril, metoprolol tartrate,  montelukast,  omeprazole, propranolol er, quetiapine, sitagliptin-metformin, and sumatriptan.  Pharmacotherapy (Medications Ordered): No orders of the defined types were placed in this encounter.  Orders:  No orders of the defined types were placed in this encounter.  Follow-up plan:   No follow-ups on file.      Interventional Therapies  Risk Factors  Considerations:  MO  [id number]  HTN  Anxiety   Planned  Pending:   Diagnostic bilateral sacroiliac joint Blk #1     Under consideration:   Diagnostic bilateral (L4-5, L5-S1) lumbar facet (L3-S1) MBB #2  Possible bilateral lumbar facet RFA #1  Diagnostic bilateral SI joint Blk #1  Diagnostic bilateral IA hip joint injection #1  Diagnostic bilateral IA knee joint injection #1  Diagnostic bilateral cervical facet MBB #1    Completed:   Diagnostic bilateral (L4-5, L5-S1) lumbar facet (L3-S1) MBB x1    Completed by other providers:   None at this time   Therapeutic  Palliative (PRN) options:   None established        Recent Visits Date Type Provider Dept  03/10/23 Procedure visit Delano Metz, MD [facility]  01/27/23 Office Visit Delano Metz, MD [facility]  12/30/22 Procedure visit Delano Metz, MD [facility]  Showing recent visits within past 90 days and meeting all other requirements Future Appointments Date Type Provider Dept  03/29/23 Appointment Delano Metz, MD [facility]  Showing future appointments within next 90 days and meeting all other requirements  I discussed the assessment and treatment plan with the patient. The patient was provided an opportunity to ask questions and all were answered. The patient agreed with the plan and demonstrated an understanding of the instructions.  Patient advised to call back or seek an in-person evaluation if the symptoms or condition worsens.  Duration of encounter: *** minutes.  Total time on  encounter, as per AMA guidelines included both the face-to-face and non-face-to-face time personally spent by the physician and/or other qualified health care professional(s) on the day of the encounter (includes time in activities that require the physician or other qualified health care professional and does not include time in activities normally performed by clinical staff). Physician's time may include the following activities when performed: Preparing to see the patient (e.g., pre-charting review of records, searching for previously ordered imaging, lab work, and nerve conduction tests) Review of prior analgesic pharmacotherapies. Reviewing PMP Interpreting ordered tests (e.g., lab work, imaging, nerve conduction tests) Performing post-procedure evaluations, including interpretation of diagnostic procedures Obtaining and/or reviewing separately obtained history Performing a medically appropriate examination and/or evaluation Counseling and educating the patient/family/caregiver Ordering medications, tests, or procedures Referring and communicating with other health care professionals (when not separately reported) Documenting clinical information in the electronic or other health record Independently interpreting results (not separately reported) and communicating results to the patient/ family/caregiver Care coordination (not separately reported)  Note by: Oswaldo Done, MD Date: 03/29/2023; Time: 6:29 AM

## 2023-03-29 ENCOUNTER — Encounter: Payer: Self-pay | Admitting: Pain Medicine

## 2023-03-29 ENCOUNTER — Ambulatory Visit: Payer: MEDICAID | Attending: Pain Medicine | Admitting: Pain Medicine

## 2023-03-29 VITALS — BP 114/79 | HR 83 | Temp 97.7°F | Ht 64.0 in | Wt 221.0 lb

## 2023-03-29 DIAGNOSIS — M25552 Pain in left hip: Secondary | ICD-10-CM | POA: Insufficient documentation

## 2023-03-29 DIAGNOSIS — M545 Low back pain, unspecified: Secondary | ICD-10-CM | POA: Insufficient documentation

## 2023-03-29 DIAGNOSIS — G8929 Other chronic pain: Secondary | ICD-10-CM | POA: Insufficient documentation

## 2023-03-29 DIAGNOSIS — M25551 Pain in right hip: Secondary | ICD-10-CM | POA: Insufficient documentation

## 2023-03-29 DIAGNOSIS — Z09 Encounter for follow-up examination after completed treatment for conditions other than malignant neoplasm: Secondary | ICD-10-CM | POA: Diagnosis present

## 2023-03-29 DIAGNOSIS — M533 Sacrococcygeal disorders, not elsewhere classified: Secondary | ICD-10-CM | POA: Diagnosis not present

## 2023-03-29 DIAGNOSIS — R937 Abnormal findings on diagnostic imaging of other parts of musculoskeletal system: Secondary | ICD-10-CM | POA: Diagnosis present

## 2023-03-29 NOTE — Progress Notes (Signed)
Safety precautions to be maintained throughout the outpatient stay will include: orient to surroundings, keep bed in low position, maintain call bell within reach at all times, provide assistance with transfer out of bed and ambulation.  

## 2023-03-29 NOTE — Patient Instructions (Signed)
  ____________________________________________________________________________________________  Muscle Spasms & Cramps  Cause(s):  Most common - vitamin and/or electrolyte (calcium, potassium, sodium, etc.) deficiencies. Post procedure - steroids can make your kidneys excrete electrolytes. If you happen to have been borderline low on your electrolytes, it may temporarily triggering cramps & spasms.  Possible triggers: Sweating - causes loss of electrolytes thru the skin. Steroids - causes loss of electrolytes thru the urine.  Treatment: Gatorade (or any other electrolyte-replenishing drink) - Take 1, 8 oz glass with each meal (3 times a day). OTC (over-the-counter) Magnesium 400 to 500 mg - Take 1 tablet twice a day (one with breakfast and one before bedtime). If you have kidney problems, talk to your primary care physician before taking any Magnesium. Tonic Water with quinine - Take 1, 8 oz glass before bedtime.   ____________________________________________________________________________________________    

## 2023-03-30 ENCOUNTER — Telehealth: Payer: Self-pay | Admitting: Pain Medicine

## 2023-03-30 ENCOUNTER — Telehealth: Payer: Self-pay | Admitting: Student in an Organized Health Care Education/Training Program

## 2023-03-30 NOTE — Telephone Encounter (Signed)
Patient states she cannot do PT right now because she is in a lot of pain. I informed her that our office does not prescribe "pain medication" at this time for patients who aren't already taking it. I advised her to schedule appt with Dr. Laban Emperor to discuss her continued pain. She agreed to do so, call transferred to [employer].   To schedule.

## 2023-03-30 NOTE — Telephone Encounter (Signed)
PT called states that she was told by the neurosurgery scheduler that she needed to have at least 5 appointments with physical therapy before they can see her. PT stated that she is in a lot of pain, needs to see what can be done. Please give patient a call. TY

## 2023-03-30 NOTE — Telephone Encounter (Signed)
ERROR

## 2023-04-10 NOTE — Progress Notes (Signed)
PROVIDER NOTE: Information contained herein reflects review and annotations entered in association with encounter. Interpretation of such information and data should be left to medically-trained personnel. Information provided to patient can be located elsewhere in the medical record under "Patient Instructions". Document created using STT-dictation technology, any transcriptional errors that may result from process are unintentional.    Patient: Brenda Simmons  Service Category: E/M  Provider: Oswaldo Done, MD  DOB: Nov 08, 1975  DOS: 04/11/2023  Referring Provider: Sandrea Hughs, NP  MRN: [id number]  Specialty: Interventional Pain Management  PCP: Sandrea Hughs, NP  Type: Established Patient  Setting: Ambulatory outpatient    Location: Office  Delivery: Face-to-face     HPI  [name], a 47 y.o. year old female, is here today because of her Chronic neck pain [M54.2, G89.29]. Brenda Simmons's primary complain today is Neck Pain  Pertinent problems: Brenda Simmons has Disorder of rotator cuff; Chronic neck pain (2ry area of Pain) (Bilateral) (R>L); Chronic right shoulder pain; Migraine headache without aura; Migrainous dizziness; Right-sided low back pain with right-sided sciatica; Bilateral lower extremity pain; Chronic tension-type headache, not intractable; Herniated cervical disc; Osteoarthritis of spine with radiculopathy, cervical region; Right rotator cuff tear; Shoulder tendinitis, right; Abnormal CT scan, cervical spine (01/24/2022); DDD (degenerative disc disease), cervical (C5-C7); Chronic pain syndrome; Chronic low back pain (1ry area of Pain) (Bilateral) (R>L) w/o sciatica; Chronic hip pain (3ry area of Pain) (Bilateral) (R>L); Chronic knee pain (4th area of Pain) (Bilateral) (R>L); Chronic lower extremity pain (5th area of Pain) (Bilateral) (L>R); History of ankle surgery old (2006) (Left); Numbness of lower extremity (Intermittent) (Bilateral); DDD (degenerative disc disease),  lumbosacral; Osteoarthritis of knee (Right); Osteoarthritis of hips (Bilateral); Osteoarthritis of sacroiliac joints (Bilateral) (HCC); Cervical facet hypertrophy (Multilevel) (Bilateral); Cervical foraminal stenosis  (Right: C3-4) (Bilateral: C4-5, C5-6) (Left: C6-7); Grade 1 (5 mm) anterolisthesis of lumbar spine (L4/L5); Lumbar facet joint arthropathy (L4-5); Lumbosacral facet joint syndrome; Spondylosis without myelopathy or radiculopathy, lumbosacral region; Lumbar facet joint pain; Chronic sacroiliac joint pain (Bilateral) (R>L); Disorder of sacroiliac joints (Bilateral); Somatic dysfunction of sacroiliac joints (Bilateral); Other spondylosis, sacral and sacrococcygeal region; Abnormal MRI, lumbar spine (05/07/2022); Abnormal MRI, cervical spine (05/07/2022); Chronic upper extremity pain (Left); Cervical radiculitis (Left); and Acute neck pain on their pertinent problem list. Pain Assessment: Severity of Chronic pain is reported as a 9 /10. Location: Neck  /right arm to the hand. Onset: More than a month ago. Quality: Aching, Dull. Timing: Constant. Modifying factor(s): hot shower. Vitals:  height is 5\' 4"  (1.626 m) and weight is 219 lb (99.3 kg). Her temporal temperature is 97.5 F (36.4 C) (abnormal). Her blood pressure is 153/83 (abnormal) and her pulse is 72. Her respiration is 16 and oxygen saturation is 100%.  BMI: Estimated body mass index is 37.59 kg/m as calculated from the following:   Height as of this encounter: 5\' 4"  (1.626 m).   Weight as of this encounter: 219 lb (99.3 kg). Last encounter: 03/29/2023. Last procedure: 03/10/2023.  Reason for encounter: follow-up evaluation.  On 03/29/2023 I entered a referral to neurosurgery.  On 10/18/2022 I had also entered a referral to physical therapy.  Diagnostic x-rays of the lumbar spine done on 10/18/2022 indicate patient to have a grade 1 anterolisthesis of L4 over L5.  A diagnostic MRI done on 05/07/2022 indicates the patient to have anterolisthesis  at L3-4, L4-5, and L5-S1 levels with no significant Lumbar central or foraminal stenosis.  With these changes, the most common cause  of that low back pain would be lumbar facet disease.  Apparently the physical therapy practice where we sent her does not take Medicaid.  In terms of the neurosurgeon, they indicated that she needed physical therapy first.  On 12/30/2022 the patient underwent a diagnostic L3-S1 MBB which she indicated gave her 100% relief of the pain for the first hour, followed by 80% relief for the next 4 to 6 hours and 0% relief thereafter with no benefit on function or range of motion.  On the day of the procedure she indicated her pain to have gone from 8/10 to a 0/10.  At the time of the follow-up the patient seems to be having pain coming from the sacroiliac joints and for that reason on 03/10/2023 a diagnostic bilateral sacroiliac joint block using the superior approach was done.  At that time the patient indicated her pain to have gone from 9/10 to a 0/10.  On follow-up the patient indicated only 80% relief of the pain for the first hour followed thereafter by 0% relief.  Because neither procedure provided her with any relief of the pain past the initial 6 to 8 hours, then we have to assume that the patient had no benefit from the steroid suggesting no inflammatory component to her low back pain.  Set rate (chronic phase marker) done on 10/22/2022 came back within normal limits however the C-reactive protein (acute phase marker) had been elevated.   Today she indicates her primary paring to be that of the neck and the left upper extremity.  She does have an MRI of the cervical spine done on 05/07/2022 positive for C3-4 and C4-5 disc protrusions with some degree of cervical central canal stenosis and moderate foraminal stenosis at those 2 levels.  It also shows mild central canal stenosis at C5-6 and C6-7 levels with severe foraminal stenosis at C5-6 level.  (05/07/2022) CERVICAL MRI  IMPRESSION:  Multilevel spondylosis with overall degree of neural compromise similar to 2019 examination, however degree of cervical kyphosis has increased since prior.  1. C3-4 and C4-5 posterior disc protrusions indenting the ventral cord, without focal cord signal abnormality (moderate, grade 2 cervical canal  stenosis) at each level. At least moderate foraminal stenoses at these levels. Findings overall similar compared to 2019.  2. C5-6 and C6-7 mild canal stenosis, similar to 2019. Severe foraminal stenoses at C5-6.   Today the patient presents with an acute exacerbation of her neck and left upper extremity pain with pain going all the way down into her index, middle, and ring finger of the right hand and what appears to be a C6/C7 dermatomal distribution.  This would go along with the severe foraminal stenosis at the C5-6 level.  Today she presents with decreased range of motion of the cervical spine with some perceived weakness of the right upper extremity.  She refers that when she has a flareup she drops things.  She also indicates having numbness of all of her fingers in the right hand but primarily the ones indicated above.  Today we will go ahead and proceed with an IM injection of Toradol and Robaxin and we will schedule her for a right-sided cervical ESI under fluoroscopic guidance.  The patient has requested to have done with some sedation.  Will try to see if we can get her referred to a physical therapy practice that takes Medicaid.  Pharmacotherapy Assessment  Analgesic: None MME/day: 0 mg/day   Monitoring: [place] PMP: PDMP reviewed during this encounter.  Pharmacotherapy: No side-effects or adverse reactions reported. Compliance: No problems identified. Effectiveness: Clinically acceptable.  No notes on file  No results found for: "CBDTHCR" No results found for: "D8THCCBX" No results found for: "D9THCCBX"  UDS:  Summary  Date Value Ref Range Status  10/18/2022 Note   Final    Comment:    ==================================================================== Compliance Drug Analysis, Ur ==================================================================== Test                             Result       Flag       Units  Drug Present and Declared for Prescription Verification   Gabapentin                     PRESENT      EXPECTED   Salicylate                     PRESENT      EXPECTED   Propranolol                    PRESENT      EXPECTED  Drug Present not Declared for Prescription Verification   Naproxen                       PRESENT      UNEXPECTED  Drug Absent but Declared for Prescription Verification   Fluoxetine                     Not Detected UNEXPECTED   Quetiapine                     Not Detected UNEXPECTED   Ibuprofen                      Not Detected UNEXPECTED    Ibuprofen, as indicated in the declared medication list, is not    always detected even when used as directed.  ==================================================================== Test                      Result    Flag   Units      Ref Range   Creatinine              139              mg/dL      >=75 ==================================================================== Declared Medications:  The flagging and interpretation on this report are based on the  following declared medications.  Unexpected results may arise from  inaccuracies in the declared medications.   **Note: The testing scope of this panel includes these medications:   Fluoxetine (Prozac)  Gabapentin (Neurontin)  Propranolol  Quetiapine (Seroquel)   **Note: The testing scope of this panel does not include small to  moderate amounts of these reported medications:   Aspirin  Ibuprofen (Advil)  Salicylate (Salicylamide)   **Note: The testing scope of this panel does not include the  following reported medications:   Albuterol  Caffeine  Cetirizine (Zyrtec)  Fluticasone (Flonase)  Metformin (Janumet)   Montelukast  Multivitamin  Omeprazole  Ondansetron  Sitagliptin (Janumet) ==================================================================== For clinical consultation, please call [phone]. ====================================================================       ROS  Constitutional: Denies any fever or chills Gastrointestinal: No reported hemesis, hematochezia, vomiting, or acute GI distress Musculoskeletal: Denies any acute onset joint swelling, redness,  loss of ROM, or weakness Neurological: No reported episodes of acute onset apraxia, aphasia, dysarthria, agnosia, amnesia, paralysis, loss of coordination, or loss of consciousness  Medication Review  Aspirin-Salicylamide-Caffeine, Rightest GL300 Lancets, SUMAtriptan, Vitamin D (Ergocalciferol), albuterol, atorvastatin, cetirizine, cyclobenzaprine, escitalopram, fluticasone, gabapentin, glucose blood, ibuprofen, lisinopril, meclizine, metoprolol tartrate, omeprazole, ondansetron, propranolol ER, sitaGLIPtin-metformin, and traZODone  History Review  Allergy: Brenda Simmons has No Known Allergies. Drug: Brenda Simmons  reports no history of drug use. Alcohol:  reports current alcohol use. Tobacco:  reports that she has been smoking cigarettes. She has a 3.8 pack-year smoking history. She has never used smokeless tobacco. Social: Brenda Simmons  reports that she has been smoking cigarettes. She has a 3.8 pack-year smoking history. She has never used smokeless tobacco. She reports current alcohol use. She reports that she does not use drugs. Medical:  has a past medical history of Anxiety, Breast mass (x 3 mos), Bulging lumbar disc, Chronic bronchitis (HCC), Depression, Diabetes mellitus without complication (HCC), Hypertension, Migraines, Partial tear of rotator cuff, and Pre-diabetes. Surgical: Brenda Simmons  has a past surgical history that includes Other surgical history (Left, ankle surgery); Salpingectomy (Bilateral); Ankle surgery  (Left); Breast cyst aspiration (Right, age 62); and Tubal ligation (Bilateral). Family: family history includes Cancer in her maternal grandmother and paternal grandmother; Depression in her brother and mother; Diabetes in her mother; Heart failure in her mother; Hypertension in her father, mother, and sister.  Laboratory Chemistry Profile   Renal Lab Results  Component Value Date   BUN 10 03/16/2023   CREATININE 0.71 03/16/2023   BCR 8 (L) 10/18/2022   GFRAA >60 07/28/2016   GFRNONAA >60 03/16/2023    Hepatic Lab Results  Component Value Date   AST 14 10/18/2022   ALT 11 (L) 07/28/2016   ALBUMIN 3.8 (L) 10/18/2022   ALKPHOS 114 10/18/2022   LIPASE 103 09/17/2014    Electrolytes Lab Results  Component Value Date   NA 135 03/16/2023   K 4.1 03/16/2023   CL 104 03/16/2023   CALCIUM 8.4 (L) 03/16/2023   MG 1.7 10/18/2022    Bone Lab Results  Component Value Date   VD25OH 23.0 (L) 12/04/2015   25OHVITD1 9.2 (L) 10/18/2022   25OHVITD2 <1.0 10/18/2022   25OHVITD3 9.1 10/18/2022    Inflammation (CRP: Acute Phase) (ESR: Chronic Phase) Lab Results  Component Value Date   CRP 14 (H) 10/18/2022   ESRSEDRATE 7 10/18/2022         Note: Above Lab results reviewed.  Recent Imaging Review  LONG TERM MONITOR (3-14 DAYS) Patch Wear Time:  2 days and 19 hours (2024-06-24T12:19:28-0400 to  2024-06-27T07:50:23-0400)  Patient had a min HR of 56 bpm, max HR of 117 bpm, and avg HR of 79 bpm.  Predominant underlying rhythm was Sinus Rhythm. Isolated SVEs were rare  (<1.0%), SVE Couplets were rare (<1.0%), and no SVE Triplets were present.  Isolated VEs were rare (<1.0%),  and no VE Couplets or VE Triplets were present.  Conclusion Benign cardiac monitor with no significant arrhythmias. Patient triggered events associated with sinus rhythm.  (05/07/2022) LUMBAR MRI IMPRESSION: Chronic multilevel lumbar facet arthropathy, associated with minimal anterolistheses at the L3-4, L4-5,  and L5-S1 levels. Foramina, there is no significant lumbar canal or foraminal stenosis.   (05/07/2022) CERVICAL MRI IMPRESSION:  Multilevel spondylosis with overall degree of neural compromise similar to 2019 examination, however degree of cervical kyphosis has increased since prior.  1. C3-4 and C4-5 posterior disc protrusions indenting the ventral  cord, without focal cord signal abnormality (moderate, grade 2 cervical canal  stenosis) at each level. At least moderate foraminal stenoses at these levels. Findings overall similar compared to 2019.  2. C5-6 and C6-7 mild canal stenosis, similar to 2019. Severe foraminal stenoses at C5-6.   EXAM: LUMBAR SPINE - COMPLETE WITH BENDING VIEWS   COMPARISON:  Lumbar spine radiographs 01/24/2022, CT abdomen and pelvis 03/14/2022; chest two views 12/20/2017   FINDINGS: When counting down from T1 on prior chest radiographs and CT abdomen pelvis 03/14/2022, there are hypoplastic ribs seen at the vertebral body considered "T13." Distal to this, the next 5 vertebral bodies are considered L1 through L5 with partial sacralization of L5.   By this numbering, the L5 vertebral body is partially above but predominantly below the iliac crests. There is 5 mm grade 1 anterolisthesis of L4 on L5, unchanged from prior. Vertebral body heights are maintained.   Moderate L4-5 facet joint arthropathy.   No significant disc space narrowing. Predominantly hypoplastic disc at L5-S1.   Mild bilateral sacroiliac joint subchondral sclerosis. Vascular phleboliths overlie the pelvis.   IMPRESSION: 1. 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 Grade 1 anterolisthesis of L4 on L5, unchanged from prior.     Electronically Signed   By: Neita Garnet M.D.   On: 10/19/2022 13:39  Note: Reviewed        Physical Exam  General appearance: Well nourished, well developed, and well hydrated. In no apparent acute  distress Mental status: Alert, oriented x 3 (person, place, & time)       Respiratory: No evidence of acute respiratory distress Eyes: PERLA Vitals: BP (!) 153/83   Pulse 72   Temp (!) 97.5 F (36.4 C) (Temporal)   Resp 16   Ht 5\' 4"  (1.626 m)   Wt 219 lb (99.3 kg)   SpO2 100%   BMI 37.59 kg/m  BMI: Estimated body mass index is 37.59 kg/m as calculated from the following:   Height as of this encounter: 5\' 4"  (1.626 m).   Weight as of this encounter: 219 lb (99.3 kg). Ideal: Ideal body weight: 54.7 kg (120 lb 9.5 oz) Adjusted ideal body weight: 72.6 kg (159 lb 15.3 oz)  Assessment   Diagnosis Status  1. Chronic neck pain (2ry area of Pain) (Bilateral) (R>L)   2. Cervical radiculitis (Left)   3. Chronic upper extremity pain (Left)   4. DDD (degenerative disc disease), cervical (C5-C7)   5. Cervical foraminal stenosis  (Right: C3-4) (Bilateral: C4-5, C5-6) (Left: C6-7)   6. Abnormal MRI, cervical spine (05/07/2022)   7. Acute neck pain    Controlled Controlled Controlled   Updated Problems: Problem  Chronic upper extremity pain (Left)  Cervical radiculitis (Left)  Acute Neck Pain    Plan of Care  Problem-specific:  No problem-specific Assessment & Plan notes found for this encounter.  Brenda Simmons has a current medication list which includes the following long-term medication(s): albuterol, escitalopram, fluticasone, lisinopril, omeprazole, propranolol er, sitagliptin-metformin, sumatriptan, gabapentin, and metoprolol tartrate.  Pharmacotherapy (Medications Ordered): Meds ordered this encounter  Medications   ketorolac (TORADOL) injection 60 mg   methocarbamol (ROBAXIN) injection 200 mg   Orders:  Orders Placed This Encounter  Procedures   Cervical Epidural Injection    Sedation: Patient's choice. Purpose: Diagnostic/Therapeutic Indication(s): Radiculitis and cervicalgia associater with cervical degenerative disc disease.    Standing Status:   Future     Standing Expiration Date:  07/12/2023    Scheduling Instructions:     Procedure: Cervical Epidural Steroid Injection/Block     Level(s): C7-T1     Laterality: Left-sided     Timeframe: As soon as schedule allows    Order Specific Question:   Where will this procedure be performed?    Answer:   [facility]    Comments:   by Dr. Laban Emperor   Informed Consent Details: Physician/Practitioner Attestation; Transcribe to consent form and obtain patient signature    Do not administer NSAIDs (Toradol, etc.) if patient has an allergy or intolerance to NSAIDs or if patient has CKD (chronic kidney disease or failure). Avoid the use of Muscle Relaxants (orphenedrine/Norflex, etc.) if patient has allergy or intolerance to this medication.    Scheduling Instructions:     Nursing orders: Complete pain questionnaire before administration of therapy. Document location, laterality, onset, level, trigger, and current medications taken for the pain.    Order Specific Question:   Physician/Practitioner attestation of informed consent for procedure/surgical case    Answer:   I, the physician/practitioner, attest that I have discussed with the patient the benefits, risks, side effects, alternatives, likelihood of achieving goals and potential problems during recovery for the procedure that I have provided informed consent.    Order Specific Question:   Procedure    Answer:   IM injection of therapeutic substance    Order Specific Question:   Physician/Practitioner performing the procedure    Answer:    A. Laban Emperor, MD    Order Specific Question:   Indication/Reason    Answer:   Acute on chronic pain   Follow-up plan:   Return for (ECT): (R) CESI #1.      Interventional Therapies  Risk Factors  Considerations:  MO  [id number]  HTN  Anxiety   Planned  Pending:       Under consideration:   Diagnostic bilateral (L4-5, L5-S1) lumbar facet (L3-S1) MBB #2  Possible bilateral lumbar facet  RFA #1  Diagnostic bilateral SI joint Blk #1  Diagnostic bilateral IA hip joint injection #1  Diagnostic bilateral IA knee joint injection #1  Diagnostic bilateral cervical facet MBB #1    Completed:   Diagnostic bilateral sacroiliac joint Blk x1 (80/0/0/0)  Diagnostic bilateral (L4-5, L5-S1) lumbar facet (L3-S1) MBB x1 (100/80/0/0)    Completed by other providers:   None at this time   Therapeutic  Palliative (PRN) options:   None established         Recent Visits Date Type Provider Dept  03/29/23 Office Visit Delano Metz, MD [facility]  03/10/23 Procedure visit Delano Metz, MD [facility]  01/27/23 Office Visit Delano Metz, MD [facility]  Showing recent visits within past 90 days and meeting all other requirements Today's Visits Date Type Provider Dept  04/11/23 Office Visit Delano Metz, MD [facility]  Showing today's visits and meeting all other requirements Future Appointments No visits were found meeting these conditions. Showing future appointments within next 90 days and meeting all other requirements  I discussed the assessment and treatment plan with the patient. The patient was provided an opportunity to ask questions and all were answered. The patient agreed with the plan and demonstrated an understanding of the instructions.  Patient advised to call back or seek an in-person evaluation if the symptoms or condition worsens.  Duration of encounter: 54 minutes.  Total time on encounter, as per AMA guidelines included both the face-to-face and non-face-to-face time personally  spent by the physician and/or other qualified health care professional(s) on the day of the encounter (includes time in activities that require the physician or other qualified health care professional and does not include time in activities normally performed by clinical staff). Physician's time may include the following  activities when performed: Preparing to see the patient (e.g., pre-charting review of records, searching for previously ordered imaging, lab work, and nerve conduction tests) Review of prior analgesic pharmacotherapies. Reviewing PMP Interpreting ordered tests (e.g., lab work, imaging, nerve conduction tests) Performing post-procedure evaluations, including interpretation of diagnostic procedures Obtaining and/or reviewing separately obtained history Performing a medically appropriate examination and/or evaluation Counseling and educating the patient/family/caregiver Ordering medications, tests, or procedures Referring and communicating with other health care professionals (when not separately reported) Documenting clinical information in the electronic or other health record Independently interpreting results (not separately reported) and communicating results to the patient/ family/caregiver Care coordination (not separately reported)  Note by: Oswaldo Done, MD Date: 04/11/2023; Time: 1:18 PM

## 2023-04-11 ENCOUNTER — Encounter: Payer: Self-pay | Admitting: Pain Medicine

## 2023-04-11 ENCOUNTER — Ambulatory Visit: Payer: MEDICAID | Attending: Pain Medicine | Admitting: Pain Medicine

## 2023-04-11 VITALS — BP 153/83 | HR 72 | Temp 97.5°F | Resp 16 | Ht 64.0 in | Wt 219.0 lb

## 2023-04-11 DIAGNOSIS — M542 Cervicalgia: Secondary | ICD-10-CM | POA: Diagnosis present

## 2023-04-11 DIAGNOSIS — M79602 Pain in left arm: Secondary | ICD-10-CM | POA: Diagnosis not present

## 2023-04-11 DIAGNOSIS — M5030 Other cervical disc degeneration, unspecified cervical region: Secondary | ICD-10-CM

## 2023-04-11 DIAGNOSIS — M4802 Spinal stenosis, cervical region: Secondary | ICD-10-CM | POA: Diagnosis not present

## 2023-04-11 DIAGNOSIS — R937 Abnormal findings on diagnostic imaging of other parts of musculoskeletal system: Secondary | ICD-10-CM | POA: Diagnosis present

## 2023-04-11 DIAGNOSIS — M5412 Radiculopathy, cervical region: Secondary | ICD-10-CM | POA: Insufficient documentation

## 2023-04-11 DIAGNOSIS — G8929 Other chronic pain: Secondary | ICD-10-CM | POA: Insufficient documentation

## 2023-04-11 DIAGNOSIS — M545 Low back pain, unspecified: Secondary | ICD-10-CM

## 2023-04-11 MED ORDER — KETOROLAC TROMETHAMINE 60 MG/2ML IM SOLN
60.0000 mg | Freq: Once | INTRAMUSCULAR | Status: AC
  Administered 2023-04-11: 60 mg via INTRAMUSCULAR
  Filled 2023-04-11: qty 2

## 2023-04-11 MED ORDER — METHOCARBAMOL 1000 MG/10ML IJ SOLN
200.0000 mg | Freq: Once | INTRAMUSCULAR | Status: AC
  Administered 2023-04-11: 200 mg via INTRAMUSCULAR
  Filled 2023-04-11: qty 10

## 2023-04-11 NOTE — Patient Instructions (Addendum)
Epidural Steroid Injection  An epidural steroid injection is a shot of steroid medicine, also called cortisone, and a numbing medicine that is given into the epidural space. This space is between the spinal cord and the bones of the back. This shot helps relieve pain caused by an irritated or swollen nerve root. The pain relief you get from the injection depends on the cause of your condition and how long your pain lasts. You may have a period of slightly more pain after your injection, before the steroid medicine takes effect. This medicine usually starts working within 1-3 days. In some cases, you might need 7-10 days to feel the full effect. Tell your health care provider about: Any allergies you have. All medicines you are taking, including vitamins, herbs, eye drops, creams, and over-the-counter medicines. Any problems you or family members have had with anesthesia. Any bleeding problems you have. Any surgeries you have had. Any medical conditions you have. Whether you are pregnant or may be pregnant. What are the risks? Your health care provider will talk with you about risks. These may include: Headache. Bleeding. Infection. Allergic reaction to medicines or dyes. Nerve damage. Not being able to move (paralysis). This is rare. What happens before the procedure? Medicines You may be given medicines to lower anxiety. Ask your provider about: Changing or stopping your regular medicines. These include any diabetes medicines or blood thinners you take. Taking medicines such as aspirin and ibuprofen. These medicines can thin your blood. Do not take them unless your provider tells you to. Taking over-the-counter medicines, vitamins, herbs, and supplements. General instructions Follow instructions from your provider about what you may eat and drink. Ask your provider what steps will be taken to help prevent infection. If you will be going home right after the procedure, plan to have a  responsible adult: Take you home from the hospital or clinic. You will not be allowed to drive. Care for you for the time you are told. What happens during the procedure?  An IV will be inserted into one of your veins. You may be given a sedative to help you relax. You will be asked to sit or lie on your side. The injection site will be cleaned. An X-ray machine will be used to guide the needle close to the nerve that is causing pain. A needle will be put through your skin into the epidural space. This may cause you some discomfort. Contrast dye may be injected at the site to make sure that the steroid medicine will be sent to the exact place it needs to go. The steroid medicine and a numbing medicine will be injected into the epidural space for pain relief. The needle will be removed. A bandage (dressing) will be put over the injection site. The procedure may vary among providers and hospitals. What happens after the procedure? Your blood pressure, heart rate, breathing rate, and blood oxygen level will be monitored until you leave the hospital or clinic. Your IV will be removed. Your arm or leg may feel weak or numb for a few hours. This information is not intended to replace advice given to you by your health care provider. Make sure you discuss any questions you have with your health care provider. Document Revised: 04/23/2022 Document Reviewed: 04/23/2022 [employer] Patient Education  2024 [employer]  ______________________________________________________________________  Procedure instructions  Do not eat or drink fluids (other than water) for 6 hours before your procedure  No water for 2 hours before your procedure    Take your blood pressure medicine with a sip of water  Arrive 30 minutes before your appointment  Carefully read the "Preparing for your procedure" detailed instructions  If you have questions call us at [phone]  [phone]  _____________________________________________________________________    ______________________________________________________________________  Preparing for your procedure  Appointments: If you think you may not be able to keep your appointment, call 24-48 hours in advance to cancel. We need time to make it available to others.  During your procedure appointment there will be: No Prescription Refills. No disability issues to discussed. No medication changes or discussions.  Instructions: Food intake: Avoid eating anything solid for at least 8 hours prior to your procedure. Clear liquid intake: You may take clear liquids such as water up to 2 hours prior to your procedure. (No carbonated drinks. No soda.) Transportation: Unless otherwise stated by your physician, bring a driver. Morning Medicines: Except for blood thinners, take all of your other morning medications with a sip of water. Make sure to take your heart and blood pressure medicines. If your blood pressure's lower number is above 100, the case will be rescheduled. Blood thinners: Make sure to stop your blood thinners as instructed.  If you take a blood thinner, but were not instructed to stop it, call our office [phone] and ask to talk to a nurse. Not stopping a blood thinner prior to certain procedures could lead to serious complications. Diabetics on insulin: Notify the staff so that you can be scheduled 1st case in the morning. If your diabetes requires high dose insulin, take only  of your normal insulin dose the morning of the procedure and notify the staff that you have done so. Preventing infections: Shower with an antibacterial soap the morning of your procedure.  Build-up your immune system: Take 1000 mg of Vitamin C with every meal (3 times a day) the day prior to your procedure. Antibiotics: Inform the nursing staff if you are taking any antibiotics or if you have any conditions that may require  antibiotics prior to procedures. (Example: recent joint implants)   Pregnancy: If you are pregnant make sure to notify the nursing staff. Not doing so may result in injury to the fetus, including death.  Sickness: If you have a cold, fever, or any active infections, call and cancel or reschedule your procedure. Receiving steroids while having an infection may result in complications. Arrival: You must be in the facility at least 30 minutes prior to your scheduled procedure. Tardiness: Your scheduled time is also the cutoff time. If you do not arrive at least 15 minutes prior to your procedure, you will be rescheduled.  Children: Do not bring any children with you. Make arrangements to keep them home. Dress appropriately: There is always a possibility that your clothing may get soiled. Avoid long dresses. Valuables: Do not bring any jewelry or valuables.  Reasons to call and reschedule or cancel your procedure: (Following these recommendations will minimize the risk of a serious complication.) Surgeries: Avoid having procedures within 2 weeks of any surgery. (Avoid for 2 weeks before or after any surgery). Flu Shots: Avoid having procedures within 2 weeks of a flu shots or . (Avoid for 2 weeks before or after immunizations). Barium: Avoid having a procedure within 7-10 days after having had a radiological study involving the use of radiological contrast. (Myelograms, Barium swallow or enema study). Heart attacks: Avoid any elective procedures or surgeries for the initial 6 months after a "Myocardial Infarction" (Heart Attack). Blood thinners:   It is imperative that you stop these medications before procedures. Let us know if you if you take any blood thinner.  Infection: Avoid procedures during or within two weeks of an infection (including chest colds or gastrointestinal problems). Symptoms associated with infections include: Localized redness, fever, chills, night sweats or profuse sweating, burning  sensation when voiding, cough, congestion, stuffiness, runny nose, sore throat, diarrhea, nausea, vomiting, cold or Flu symptoms, recent or current infections. It is specially important if the infection is over the area that we intend to treat. Heart and lung problems: Symptoms that may suggest an active cardiopulmonary problem include: cough, chest pain, breathing difficulties or shortness of breath, dizziness, ankle swelling, uncontrolled high or unusually low blood pressure, and/or palpitations. If you are experiencing any of these symptoms, cancel your procedure and contact your primary care physician for an evaluation.  Remember:  Regular Business hours are:  Monday to Thursday 8:00 AM to 4:00 PM  Provider's Schedule: Francisco Naveira, MD:  Procedure days: Tuesday and Thursday 7:30 AM to 4:00 PM  Bilal Lateef, MD:  Procedure days: Monday and Wednesday 7:30 AM to 4:00 PM  ______________________________________________________________________    ____________________________________________________________________________________________  General Risks and Possible Complications  Patient Responsibilities: It is important that you read this as it is part of your informed consent. It is our duty to inform you of the risks and possible complications associated with treatments offered to you. It is your responsibility as a patient to read this and to ask questions about anything that is not clear or that you believe was not covered in this document.  Patient's Rights: You have the right to refuse treatment. You also have the right to change your mind, even after initially having agreed to have the treatment done. However, under this last option, if you wait until the last second to change your mind, you may be charged for the materials used up to that point.  Introduction: Medicine is not an exact science. Everything in Medicine, including the lack of treatment(s), carries the potential for  danger, harm, or loss (which is by definition: Risk). In Medicine, a complication is a secondary problem, condition, or disease that can aggravate an already existing one. All treatments carry the risk of possible complications. The fact that a side effects or complications occurs, does not imply that the treatment was conducted incorrectly. It must be clearly understood that these can happen even when everything is done following the highest safety standards.  No treatment: You can choose not to proceed with the proposed treatment alternative. The "PRO(s)" would include: avoiding the risk of complications associated with the therapy. The "CON(s)" would include: not getting any of the treatment benefits. These benefits fall under one of three categories: diagnostic; therapeutic; and/or palliative. Diagnostic benefits include: getting information which can ultimately lead to improvement of the disease or symptom(s). Therapeutic benefits are those associated with the successful treatment of the disease. Finally, palliative benefits are those related to the decrease of the primary symptoms, without necessarily curing the condition (example: decreasing the pain from a flare-up of a chronic condition, such as incurable terminal cancer).  General Risks and Complications: These are associated to most interventional treatments. They can occur alone, or in combination. They fall under one of the following six (6) categories: no benefit or worsening of symptoms; bleeding; infection; nerve damage; allergic reactions; and/or death. No benefits or worsening of symptoms: In Medicine there are no guarantees, only probabilities. No healthcare provider can ever guarantee that a   medical treatment will work, they can only state the probability that it may. Furthermore, there is always the possibility that the condition may worsen, either directly, or indirectly, as a consequence of the treatment. Bleeding: This is more common if  the patient is taking a blood thinner, either prescription or over the counter (example: Goody Powders, Fish oil, Aspirin, Garlic, etc.), or if suffering a condition associated with impaired coagulation (example: Hemophilia, cirrhosis of the liver, low platelet counts, etc.). However, even if you do not have one on these, it can still happen. If you have any of these conditions, or take one of these drugs, make sure to notify your treating physician. Infection: This is more common in patients with a compromised immune system, either due to disease (example: diabetes, cancer, human immunodeficiency virus [HIV], etc.), or due to medications or treatments (example: therapies used to treat cancer and rheumatological diseases). However, even if you do not have one on these, it can still happen. If you have any of these conditions, or take one of these drugs, make sure to notify your treating physician. Nerve Damage: This is more common when the treatment is an invasive one, but it can also happen with the use of medications, such as those used in the treatment of cancer. The damage can occur to small secondary nerves, or to large primary ones, such as those in the spinal cord and brain. This damage may be temporary or permanent and it may lead to impairments that can range from temporary numbness to permanent paralysis and/or brain death. Allergic Reactions: Any time a substance or material comes in contact with our body, there is the possibility of an allergic reaction. These can range from a mild skin rash (contact dermatitis) to a severe systemic reaction (anaphylactic reaction), which can result in death. Death: In general, any medical intervention can result in death, most of the time due to an unforeseen complication. ____________________________________________________________________________________________    

## 2023-04-20 ENCOUNTER — Telehealth (HOSPITAL_COMMUNITY): Payer: Self-pay | Admitting: *Deleted

## 2023-04-20 ENCOUNTER — Other Ambulatory Visit (HOSPITAL_COMMUNITY): Payer: Self-pay | Admitting: *Deleted

## 2023-04-20 MED ORDER — METOPROLOL TARTRATE 100 MG PO TABS: 100.0000 mg | ORAL_TABLET | Freq: Once | ORAL | 0 refills | Status: DC

## 2023-04-20 NOTE — Telephone Encounter (Signed)
Reaching out to patient to offer assistance regarding upcoming cardiac imaging study; pt verbalizes understanding of appt date/time, parking situation and where to check in, pre-test NPO status and medications ordered, and verified current allergies; name and call back number provided for further questions should they arise ? ?Merle Prescott RN Navigator Cardiac Imaging ?[facility] ?[phone] office ?[phone] cell ? ?Patient to take 100mg metoprolol tartrate two hours prior to her cardiac CT scan.  ?

## 2023-04-21 ENCOUNTER — Ambulatory Visit
Admission: RE | Admit: 2023-04-21 | Discharge: 2023-04-21 | Disposition: A | Discharge status: Home / Self Care | Payer: MEDICAID | Source: Ambulatory Visit | Attending: Cardiology | Admitting: Cardiology

## 2023-04-21 DIAGNOSIS — R072 Precordial pain: Secondary | ICD-10-CM | POA: Diagnosis not present

## 2023-04-21 MED ORDER — DILTIAZEM HCL 25 MG/5ML IV SOLN
5.0000 mg | Freq: Once | INTRAVENOUS | Status: AC
  Administered 2023-04-21: 5 mg via INTRAVENOUS

## 2023-04-21 MED ORDER — SODIUM CHLORIDE 0.9 % IV SOLN: INTRAVENOUS | Status: DC

## 2023-04-21 MED ORDER — METOPROLOL TARTRATE 5 MG/5ML IV SOLN
10.0000 mg | Freq: Once | INTRAVENOUS | Status: AC
  Administered 2023-04-21: 10 mg via INTRAVENOUS

## 2023-04-21 MED ORDER — NITROGLYCERIN 0.4 MG SL SUBL
0.8000 mg | SUBLINGUAL_TABLET | Freq: Once | SUBLINGUAL | Status: AC
  Administered 2023-04-21: 0.8 mg via SUBLINGUAL

## 2023-04-21 MED ORDER — IOHEXOL 350 MG/ML SOLN
100.0000 mL | Freq: Once | INTRAVENOUS | Status: AC | PRN
  Administered 2023-04-21: 100 mL via INTRAVENOUS

## 2023-04-21 MED ORDER — NITROGLYCERIN 0.4 MG SL SUBL
0.4000 mg | SUBLINGUAL_TABLET | Freq: Once | SUBLINGUAL | Status: AC
  Administered 2023-04-21: 0.4 mg via SUBLINGUAL

## 2023-04-21 NOTE — Progress Notes (Signed)
Patient unable to have Cardiac CT due to heart rate and blood pressure. Dr Azucena Cecil ordered cardiac CT to be rescheduled with Metoprolol 100mg  PO and Ivabradine 15mg  PO pre medications. Rescheduled for 05/09/2023 at 1300 at [facility]. Patient verbalized understanding of plan of care.

## 2023-04-25 NOTE — Patient Instructions (Signed)

## 2023-04-25 NOTE — Progress Notes (Unsigned)
PROVIDER NOTE: Interpretation of information contained herein should be left to medically-trained personnel. Specific patient instructions are provided elsewhere under "Patient Instructions" section of medical record. This document was created in part using STT-dictation technology, any transcriptional errors that may result from this process are unintentional.  Patient: Brenda Simmons Type: Established DOB: 1976/06/01 MRN: [id number] PCP: Sandrea Hughs, NP  Service: Procedure DOS: 04/26/2023 Setting: Ambulatory Location: Ambulatory outpatient facility Delivery: Face-to-face Provider: Oswaldo Done, MD Specialty: Interventional Pain Management Specialty designation: 09 Location: Outpatient facility Ref. Prov.: Delano Metz, MD       Interventional Therapy   Procedure: Cervical Epidural Steroid injection (CESI) (Interlaminar) #1  Laterality: Right  Level: C7-T1 Imaging: Fluoroscopy-assisted DOS: 04/26/2023  Performed by: Delano Metz, MD Anesthesia: Local anesthesia (1-2% Lidocaine) Anxiolysis: IV Versed         Sedation:                           Purpose: Diagnostic/Therapeutic Indications: Cervicalgia, cervical radicular pain, degenerative disc disease, severe enough to impact quality of life or function. 1. Cervical radiculitis   2. Chronic neck pain (2ry area of Pain) (Bilateral) (R>L)   3. DDD (degenerative disc disease), cervical (C5-C7)   4. Cervical foraminal stenosis  (Right: C3-4) (Bilateral: C4-5, C5-6) (Left: C6-7)   5. Chronic shoulder pain (Right)   6. Abnormal MRI, cervical spine (05/07/2022)    NAS-11 score:   Pre-procedure:  /10   Post-procedure:  /10      Position  Prep  Materials:  Location setting: Procedure suite Position: Prone, on modified reverse trendelenburg to facilitate breathing, with head in head-cradle. Pillows positioned under chest (below chin-level) with cervical spine flexed. Safety Precautions: Patient was assessed for  positional comfort and pressure points before starting the procedure. Prepping solution: DuraPrep (Iodine Povacrylex [0.7% available iodine] and Isopropyl Alcohol, 74% w/w) Prep Area: Entire  cervicothoracic region Approach: percutaneous, paramedial Intended target: Posterior cervical epidural space Materials Procedure:  Tray: Epidural Needle(s): Epidural (Tuohy) Qty: 1 Length: (90mm) 3.5-inch Gauge: 17G   H&P (Pre-op Assessment):  Ms. Briscoe is a 47 y.o. (year old), female patient, seen today for interventional treatment. She  has a past surgical history that includes Other surgical history (Left, ankle surgery); Salpingectomy (Bilateral); Ankle surgery (Left); Breast cyst aspiration (Right, age 47); and Tubal ligation (Bilateral). Ms. Kaiser has a current medication list which includes the following prescription(s): albuterol, aspirin-salicylamide-caffeine, atorvastatin, cetirizine, cyclobenzaprine, escitalopram, fluticasone, gabapentin, rightest gs550 blood glucose, ibuprofen, lisinopril, meclizine, metoprolol tartrate, omeprazole, ondansetron, propranolol er, rightest gl300 lancets, sitagliptin-metformin, sumatriptan, trazodone, and vitamin d (ergocalciferol). Her primarily concern today is the No chief complaint on file.  Initial Vital Signs:  Pulse/HCG Rate:    Temp:   Resp:   BP:   SpO2:    BMI: Estimated body mass index is 37.59 kg/m as calculated from the following:   Height as of 04/11/23: 5\' 4"  (1.626 m).   Weight as of 04/11/23: 219 lb (99.3 kg).  Risk Assessment: Allergies: Reviewed. She has No Known Allergies.  Allergy Precautions: None required Coagulopathies: Reviewed. None identified.  Blood-thinner therapy: None at this time Active Infection(s): Reviewed. None identified. Ms. Guinan is afebrile  Site Confirmation: Ms. Pokorney was asked to confirm the procedure and laterality before marking the site Procedure checklist: Completed Consent: Before the procedure  and under the influence of no sedative(s), amnesic(s), or anxiolytics, the patient was informed of the treatment options, risks and possible complications. To fulfill  our ethical and legal obligations, as recommended by the American Medical Association's Code of Ethics, I have informed the patient of my clinical impression; the nature and purpose of the treatment or procedure; the risks, benefits, and possible complications of the intervention; the alternatives, including doing nothing; the risk(s) and benefit(s) of the alternative treatment(s) or procedure(s); and the risk(s) and benefit(s) of doing nothing. The patient was provided information about the general risks and possible complications associated with the procedure. These may include, but are not limited to: failure to achieve desired goals, infection, bleeding, organ or nerve damage, allergic reactions, paralysis, and death. In addition, the patient was informed of those risks and complications associated to Spine-related procedures, such as failure to decrease pain; infection (i.e.: Meningitis, epidural or intraspinal abscess); bleeding (i.e.: epidural hematoma, subarachnoid hemorrhage, or any other type of intraspinal or peri-dural bleeding); organ or nerve damage (i.e.: Any type of peripheral nerve, nerve root, or spinal cord injury) with subsequent damage to sensory, motor, and/or autonomic systems, resulting in permanent pain, numbness, and/or weakness of one or several areas of the body; allergic reactions; (i.e.: anaphylactic reaction); and/or death. Furthermore, the patient was informed of those risks and complications associated with the medications. These include, but are not limited to: allergic reactions (i.e.: anaphylactic or anaphylactoid reaction(s)); adrenal axis suppression; blood sugar elevation that in diabetics may result in ketoacidosis or comma; water retention that in patients with history of congestive heart failure may result  in shortness of breath, pulmonary edema, and decompensation with resultant heart failure; weight gain; swelling or edema; medication-induced neural toxicity; particulate matter embolism and blood vessel occlusion with resultant organ, and/or nervous system infarction; and/or aseptic necrosis of one or more joints. Finally, the patient was informed that Medicine is not an exact science; therefore, there is also the possibility of unforeseen or unpredictable risks and/or possible complications that may result in a catastrophic outcome. The patient indicated having understood very clearly. We have given the patient no guarantees and we have made no promises. Enough time was given to the patient to ask questions, all of which were answered to the patient's satisfaction. Ms. Deleo has indicated that she wanted to continue with the procedure. Attestation: I, the ordering provider, attest that I have discussed with the patient the benefits, risks, side-effects, alternatives, likelihood of achieving goals, and potential problems during recovery for the procedure that I have provided informed consent. Date  Time: {CHL [facility] TIME CHOICES:[id number]}   Pre-Procedure Preparation:  Monitoring: As per clinic protocol. Respiration, ETCO2, SpO2, BP, heart rate and rhythm monitor placed and checked for adequate function Safety Precautions: Patient was assessed for positional comfort and pressure points before starting the procedure. Time-out: I initiated and conducted the "Time-out" before starting the procedure, as per protocol. The patient was asked to participate by confirming the accuracy of the "Time Out" information. Verification of the correct person, site, and procedure were performed and confirmed by me, the nursing staff, and the patient. "Time-out" conducted as per Joint Commission's Universal Protocol (UP.01.01.01). Time:   Start Time:   hrs.  Description  Narrative of Procedure:          Rationale  (medical necessity): procedure needed and proper for the diagnosis and/or treatment of the patient's medical symptoms and needs. Start Time:   hrs. Safety Precautions: Aspiration looking for blood return was conducted prior to all injections. At no point did we inject any substances, as a needle was being advanced. No attempts were made at seeking  any paresthesias. Safe injection practices and needle disposal techniques used. Medications properly checked for expiration dates. SDV (single dose vial) medications used. Description of procedure: Protocol guidelines were followed. The patient was assisted into a comfortable position. The target area was identified and the area prepped in the usual manner. Skin & deeper tissues infiltrated with local anesthetic. Appropriate amount of time allowed to pass for local anesthetics to take effect. Using fluoroscopic guidance, the epidural needle was introduced through the skin, ipsilateral to the reported pain, and advanced to the target area. Posterior laminar os was contacted and the needle walked caudad, until the lamina was cleared. The ligamentum flavum was engaged and the epidural space identified using "loss-of-resistance technique" with 2-3 ml of PF-NaCl (0.9% NSS), in a 5cc dedicated LOR syringe. (See "Imaging guidance" below for use of contrast details.) Once proper needle placement was secured, and negative aspiration confirmed, the solution was injected in intermittent fashion, asking for systemic symptoms every 0.5cc. The needles were then removed and the area cleansed, making sure to leave some of the prepping solution back to take advantage of its long term bactericidal properties.  There were no vitals filed for this visit.   End Time:   hrs.  Imaging Guidance (Spinal):          Type of Imaging Technique: Fluoroscopy Guidance (Spinal) Indication(s): Assistance in needle guidance and placement for procedures requiring needle placement in or near  specific anatomical locations not easily accessible without such assistance. Exposure Time: Please see nurses notes. Contrast: Before injecting any contrast, we confirmed that the patient did not have an allergy to iodine, shellfish, or radiological contrast. Once satisfactory needle placement was completed at the desired level, radiological contrast was injected. Contrast injected under live fluoroscopy. No contrast complications. See chart for type and volume of contrast used. Fluoroscopic Guidance: I was personally present during the use of fluoroscopy. "Tunnel Vision Technique" used to obtain the best possible view of the target area. Parallax error corrected before commencing the procedure. "Direction-depth-direction" technique used to introduce the needle under continuous pulsed fluoroscopy. Once target was reached, antero-posterior, oblique, and lateral fluoroscopic projection used confirm needle placement in all planes. Images permanently stored in EMR. Interpretation: I personally interpreted the imaging intraoperatively. Adequate needle placement confirmed in multiple planes. Appropriate spread of contrast into desired area was observed. No evidence of afferent or efferent intravascular uptake. No intrathecal or subarachnoid spread observed. Permanent images saved into the patient's record.  Post-operative Assessment:  Post-procedure Vital Signs:  Pulse/HCG Rate:    Temp:   Resp:   BP:   SpO2:    EBL: None  Complications: No immediate post-treatment complications observed by team, or reported by patient.  Note: The patient tolerated the entire procedure well. A repeat set of vitals were taken after the procedure and the patient was kept under observation following institutional policy, for this type of procedure. Post-procedural neurological assessment was performed, showing return to baseline, prior to discharge. The patient was provided with post-procedure discharge instructions,  including a section on how to identify potential problems. Should any problems arise concerning this procedure, the patient was given instructions to immediately contact us, at any time, without hesitation. In any case, we plan to contact the patient by telephone for a follow-up status report regarding this interventional procedure.  Comments:  No additional relevant information.  Plan of Care (POC)  Orders:  No orders of the defined types were placed in this encounter.  Chronic Opioid Analgesic:  None  MME/day: 0 mg/day   Medications ordered for procedure: No orders of the defined types were placed in this encounter.  Medications administered: Chip Boer. Ijames had no medications administered during this visit.  See the medical record for exact dosing, route, and time of administration.  Follow-up plan:   No follow-ups on file.       Interventional Therapies  Risk Factors  Considerations:  MO  [id number]  HTN  Anxiety   Planned  Pending:       Under consideration:   Diagnostic bilateral (L4-5, L5-S1) lumbar facet (L3-S1) MBB #2  Possible bilateral lumbar facet RFA #1  Diagnostic bilateral SI joint Blk #1  Diagnostic bilateral IA hip joint injection #1  Diagnostic bilateral IA knee joint injection #1  Diagnostic bilateral cervical facet MBB #1    Completed:   Diagnostic bilateral sacroiliac joint Blk x1 (80/0/0/0)  Diagnostic bilateral (L4-5, L5-S1) lumbar facet (L3-S1) MBB x1 (100/80/0/0)    Completed by other providers:   None at this time   Therapeutic  Palliative (PRN) options:   None established          Recent Visits Date Type Provider Dept  04/11/23 Office Visit Delano Metz, MD [facility]  03/29/23 Office Visit Delano Metz, MD [facility]  03/10/23 Procedure visit Delano Metz, MD [facility]  01/27/23 Office Visit Delano Metz, MD [facility]  Showing recent visits within past 90  days and meeting all other requirements Future Appointments Date Type Provider Dept  04/26/23 Appointment Delano Metz, MD [facility]  Showing future appointments within next 90 days and meeting all other requirements  Disposition: Discharge home  Discharge (Date  Time): 04/26/2023;   hrs.   Primary Care Physician: Sandrea Hughs, NP Location: [facility] Note by: Oswaldo Done, MD (TTS technology used. I apologize for any typographical errors that were not detected and corrected.) Date: 04/26/2023; Time: 12:36 PM  Disclaimer:  Medicine is not an Visual merchandiser. The only guarantee in medicine is that nothing is guaranteed. It is important to note that the decision to proceed with this intervention was based on the information collected from the patient. The Data and conclusions were drawn from the patient's questionnaire, the interview, and the physical examination. Because the information was provided in large part by the patient, it cannot be guaranteed that it has not been purposely or unconsciously manipulated. Every effort has been made to obtain as much relevant data as possible for this evaluation. It is important to note that the conclusions that lead to this procedure are derived in large part from the available data. Always take into account that the treatment will also be dependent on availability of resources and existing treatment guidelines, considered by other Pain Management Practitioners as being common knowledge and practice, at the time of the intervention. For Medico-Legal purposes, it is also important to point out that variation in procedural techniques and pharmacological choices are the acceptable norm. The indications, contraindications, technique, and results of the above procedure should only be interpreted and judged by a Board-Certified Interventional Pain Specialist with extensive familiarity and expertise in the same  exact procedure and technique.

## 2023-04-26 ENCOUNTER — Ambulatory Visit: Admission: RE | Admit: 2023-04-26 | Payer: MEDICAID | Source: Ambulatory Visit

## 2023-04-26 ENCOUNTER — Ambulatory Visit: Payer: MEDICAID | Attending: Pain Medicine | Admitting: Pain Medicine

## 2023-04-26 ENCOUNTER — Encounter: Payer: Self-pay | Admitting: Pain Medicine

## 2023-04-26 VITALS — BP 118/84 | HR 74 | Temp 97.2°F | Ht 64.0 in | Wt 217.0 lb

## 2023-04-26 DIAGNOSIS — Z5189 Encounter for other specified aftercare: Secondary | ICD-10-CM

## 2023-04-26 DIAGNOSIS — M4802 Spinal stenosis, cervical region: Secondary | ICD-10-CM

## 2023-04-26 DIAGNOSIS — R937 Abnormal findings on diagnostic imaging of other parts of musculoskeletal system: Secondary | ICD-10-CM

## 2023-04-26 DIAGNOSIS — M5412 Radiculopathy, cervical region: Secondary | ICD-10-CM

## 2023-04-26 DIAGNOSIS — G8929 Other chronic pain: Secondary | ICD-10-CM

## 2023-04-26 DIAGNOSIS — Z539 Procedure and treatment not carried out, unspecified reason: Secondary | ICD-10-CM

## 2023-04-26 DIAGNOSIS — M5030 Other cervical disc degeneration, unspecified cervical region: Secondary | ICD-10-CM

## 2023-04-26 MED ORDER — PENTAFLUOROPROP-TETRAFLUOROETH EX AERO
INHALATION_SPRAY | Freq: Once | CUTANEOUS | Status: DC
  Filled 2023-04-26: qty 30

## 2023-04-26 MED ORDER — FENTANYL CITRATE (PF) 100 MCG/2ML IJ SOLN: 25.0000 ug | INTRAMUSCULAR | Status: DC | PRN

## 2023-04-26 MED ORDER — LIDOCAINE HCL 2 % IJ SOLN: 20.0000 mL | Freq: Once | INTRAMUSCULAR | Status: DC

## 2023-04-26 MED ORDER — MIDAZOLAM HCL 5 MG/5ML IJ SOLN: 0.5000 mg | Freq: Once | INTRAMUSCULAR | Status: DC

## 2023-04-26 MED ORDER — DEXAMETHASONE SODIUM PHOSPHATE 10 MG/ML IJ SOLN: 10.0000 mg | Freq: Once | INTRAMUSCULAR | Status: DC

## 2023-04-26 MED ORDER — SODIUM CHLORIDE 0.9% FLUSH: 1.0000 mL | Freq: Once | INTRAVENOUS | Status: DC

## 2023-04-26 MED ORDER — ROPIVACAINE HCL 2 MG/ML IJ SOLN: 1.0000 mL | Freq: Once | INTRAMUSCULAR | Status: DC

## 2023-04-26 MED ORDER — LACTATED RINGERS IV SOLN: Freq: Once | INTRAVENOUS | Status: DC

## 2023-04-26 MED ORDER — IOHEXOL 180 MG/ML  SOLN: 10.0000 mL | Freq: Once | INTRAMUSCULAR | Status: DC

## 2023-04-26 NOTE — Progress Notes (Signed)
Safety precautions to be maintained throughout the outpatient stay will include: orient to surroundings, keep bed in low position, maintain call bell within reach at all times, provide assistance with transfer out of bed and ambulation.  

## 2023-04-28 NOTE — Progress Notes (Deleted)
Referring Physician:  Delano Metz, MD [address] [facility] [address] [place],  [place] [address]  Primary Physician:  Sandrea Hughs, NP  History of Present Illness: 04/28/2023*** [name] has a history of migraines, chronic pain, HTN, DM.   Has been seeing pain management Laban Emperor) and he referred her to [place] for evaluation.     Look at lumbar MRI from 05/07/22***    Duration: *** Location: *** Quality: *** Severity: ***  Precipitating: aggravated by *** Modifying factors: made better by *** Weakness: none Timing: *** Bowel/Bladder Dysfunction: none  Conservative measures:  Physical therapy: ***  Multimodal medical therapy including regular antiinflammatories: flexeril, neurontin, motrin,   Injections:  Diagnostic bilateral sacroiliac joint Blk x1  Diagnostic bilateral (L4-5, L5-S1) lumbar facet (L3-S1) MBB x1  Past Surgery: ***  Chip Boer Billingham has ***no symptoms of cervical myelopathy.  The symptoms are causing a significant impact on the patient's life.   Review of Systems:  A 10 point review of systems is negative, except for the pertinent positives and negatives detailed in the HPI.  Past Medical History: Past Medical History:  Diagnosis Date   Anxiety    Breast mass x 3 mos   felt by MD, pt does not feel it   Bulging lumbar disc    Seen Pain Clinic at [facility]   Chronic bronchitis [facility])    Depression    Seeing Mental Health Professional   Diabetes mellitus without complication (HCC)    Hypertension    Migraines    Partial tear of rotator cuff    Injections did not help   Pre-diabetes    On Metformin.    Past Surgical History: Past Surgical History:  Procedure Laterality Date   ANKLE SURGERY Left    BREAST CYST ASPIRATION Right age 49   OTHER SURGICAL HISTORY Left ankle surgery   SALPINGECTOMY Bilateral    TUBAL LIGATION Bilateral     Allergies: Allergies as of 05/11/2023   (No Known Allergies)     Medications: Outpatient Encounter Medications as of 05/11/2023  Medication Sig   albuterol (PROAIR HFA) 108 (90 Base) MCG/ACT inhaler INHALE 2 PUFFS INTO THE LUNGS EVERY FOUR TO SIX HOURS AS NEEDED FOR WHEEZING.   Aspirin-Salicylamide-Caffeine (BC HEADACHE POWDER PO) Take 1 packet by mouth as needed.   atorvastatin (LIPITOR) 20 MG tablet Take 20 mg by mouth daily.   cetirizine (ZYRTEC) 10 MG tablet Take 10 mg by mouth daily.   cyclobenzaprine (FLEXERIL) 10 MG tablet Take 10 mg by mouth 3 (three) times daily.   escitalopram (LEXAPRO) 20 MG tablet Take 20 mg by mouth daily.   fluticasone (FLONASE) 50 MCG/ACT nasal spray Place 2 sprays into both nostrils once daily for allergies.   gabapentin (NEURONTIN) 300 MG capsule Take 1 capsule (300 mg total) by mouth 2 (two) times daily for 30 days. (Patient taking differently: Take 300 mg by mouth daily.)   glucose blood (RIGHTEST GS550 BLOOD GLUCOSE) test strip Use as directed   ibuprofen (ADVIL,MOTRIN) 600 MG tablet Take 600 mg by mouth every 6 (six) hours as needed for cramping.   lisinopril (ZESTRIL) 10 MG tablet Take 10 mg by mouth daily.   meclizine (ANTIVERT) 25 MG tablet Take by mouth.   metoprolol tartrate (LOPRESSOR) 100 MG tablet Take 1 tablet (100 mg total) by mouth once for 1 dose. TAKE TWO HOURS PRIOR TO CARDIAC CT   omeprazole (PRILOSEC) 20 MG capsule TAKE 1 CAPSULE BY MOUTH ONCE A DAY FOR REFLUX AND  COUGH.   ondansetron (ZOFRAN-ODT) 4 MG disintegrating tablet DISSOLVE 2 TABLETS (8 MG) BY MOUTH ONCE EVERY 8 HOURS AS NEEDED FOR NAUSEA   propranolol ER (INDERAL LA) 120 MG 24 hr capsule TAKE 1 CAPSULE BY MOUTH EVERY DAY AT BEDTIME.   Rightest GL300 Lancets MISC USE AS DIRECTED   sitaGLIPtin-metformin (JANUMET) 50-500 MG tablet Take 1 tablet by mouth 2 (two) times daily with a meal.   SUMAtriptan (IMITREX) 50 MG tablet SMARTSIG:1 Tablet(s) By Mouth   traZODone (DESYREL) 50 MG tablet Take 25-50 mg by mouth at bedtime as needed.   Vitamin D,  Ergocalciferol, (DRISDOL) 1.25 MG (50000 UNIT) CAPS capsule Take 50,000 Units by mouth once a week.   No facility-administered encounter medications on file as of 05/11/2023.    Social History: Social History   Tobacco Use   Smoking status: Every Day    Current packs/day: 0.25    Average packs/day: 0.3 packs/day for 15.0 years (3.8 ttl pk-yrs)    Types: Cigarettes   Smokeless tobacco: Never   Tobacco comments:    cutting back  Vaping Use   Vaping status: Never Used  Substance Use Topics   Alcohol use: Yes    Comment: Occasionally during Holidays   Drug use: No    Family Medical History: Family History  Problem Relation Age of Onset   Depression Mother    Hypertension Mother    Diabetes Mother    Heart failure Mother    Hypertension Father    Hypertension Sister    Depression Brother    Cancer Maternal Grandmother    Cancer Paternal Grandmother    Breast cancer Neg Hx     Physical Examination: There were no vitals filed for this visit.  General: Patient is well developed, well nourished, calm, collected, and in no apparent distress. Attention to examination is appropriate.  Respiratory: Patient is breathing without any difficulty.   NEUROLOGICAL:     Awake, alert, oriented to person, place, and time.  Speech is clear and fluent. Fund of knowledge is appropriate.   Cranial Nerves: Pupils equal round and reactive to light.  Facial tone is symmetric.    *** ROM of cervical spine *** pain *** posterior cervical tenderness. *** tenderness in bilateral trapezial region.   *** ROM of lumbar spine *** pain *** posterior lumbar tenderness.   No abnormal lesions on exposed skin.   Strength: Side Biceps Triceps Deltoid Interossei Grip Wrist Ext. Wrist Flex.  R 5 5 5 5 5 5 5   L 5 5 5 5 5 5 5    Side Iliopsoas Quads Hamstring PF DF EHL  R 5 5 5 5 5 5   L 5 5 5 5 5 5    Reflexes are ***2+ and symmetric at the biceps, brachioradialis, patella and achilles.    Hoffman's is absent.  Clonus is not present.   Bilateral upper and lower extremity sensation is intact to light touch.     Gait is normal.   ***No difficulty with tandem gait.    Medical Decision Making  Imaging: Lumbar xrays dated 10/18/22:  FINDINGS: When counting down from T1 on prior chest radiographs and CT abdomen pelvis 03/14/2022, there are hypoplastic ribs seen at the vertebral body considered "T13." Distal to this, the next 5 vertebral bodies are considered L1 through L5 with partial sacralization of L5.   By this numbering, the L5 vertebral body is partially above but predominantly below the iliac crests. There is 5 mm grade 1 anterolisthesis of  L4 on L5, unchanged from prior. Vertebral body heights are maintained.   Moderate L4-5 facet joint arthropathy.   No significant disc space narrowing. Predominantly hypoplastic disc at L5-S1.   Mild bilateral sacroiliac joint subchondral sclerosis. Vascular phleboliths overlie the pelvis.   IMPRESSION: 1. 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 Grade 1 anterolisthesis of L4 on L5, unchanged from prior.     Electronically Signed   By: Neita Garnet M.D.   On: 10/19/2022 13:39   MRI of lumbar spine dated 05/07/22:  Findings: Abnormal appearance of uterus, incompletely imaged, but chronic.  Outside CT report described "mildly prominent and lobulated uterus with  fibroids".  Segmentation: In addition to 5 standard lumbar vertebral segments, there is  a vestigial disc space at S1-2.   Conus medullaris: The conus is grossly normal size and contour. Tip of the  conus is at L1.   Vertebral Column: No acute osseous abnormality identified. Chronic  multilevel lumbar facet arthropathy is present, most prominent at L5-S1  with reactive marrow edema. The facet arthropathy is associated with  minimal degenerative anterolisthesis at L3-4, L4-5, and L5-S1.   L1-L2: No  significant disc bulge or focal disc herniation. No central canal  or foraminal stenosis.   L2-L3: No significant disc bulge or focal disc herniation. No central canal  or foraminal stenosis.   L3-L4: Minimal disc bulge and anterolisthesis without central canal or  foraminal stenosis.   L4-L5: Minimal disc bulge and anterolisthesis without central canal or  foraminal stenosis.   L5-S1: Minimal disc bulge and anterolisthesis without central canal or  foraminal stenosis.   IMPRESSION: Chronic multilevel lumbar facet arthropathy, associated with  minimal anterolistheses at the L3-4, L4-5, and L5-S1 levels. Foramina,  there is no significant lumbar canal or foraminal stenosis.     Electronically Signed by:  Isidore Moos, MD, [facility]  Electronically Signed on:  05/07/2022 2:32 PM    I have personally reviewed the images and agree with the above interpretation.  Assessment and Plan: Ms. Vermeer is a pleasant 47 y.o. female has ***  Treatment options discussed with patient and following plan made:   - Order for physical therapy for *** spine ***. Patient to call to schedule appointment. *** - Continue current medications including ***. Reviewed dosing and side effects.  - Prescription for ***. Reviewed dosing and side effects. Take with food.  - Prescription for *** to take prn muscle spasms. Reviewed dosing and side effects. Discussed this can cause drowsiness.  - MRI of *** to further evaluate *** radiculopathy. No improvement time or medications (***).  - Referral to PMR at [facility] to discuss possible *** injections.  - Will schedule phone visit to review MRI results once I get them back.   I spent a total of *** minutes in face-to-face and non-face-to-face activities related to this patient's care today including review of outside records, review of imaging, review of symptoms, physical exam, discussion of differential diagnosis, discussion of treatment options, and  documentation.   Thank you for involving me in the care of this patient.   Drake Leach PA-C Dept. of Neurosurgery

## 2023-04-29 ENCOUNTER — Ambulatory Visit: Payer: MEDICAID | Admitting: Cardiology

## 2023-05-03 ENCOUNTER — Inpatient Hospital Stay
Admission: RE | Admit: 2023-05-03 | Discharge: 2023-05-03 | Disposition: A | Discharge status: Home / Self Care | Payer: Self-pay | Source: Ambulatory Visit | Attending: Orthopedic Surgery | Admitting: Orthopedic Surgery

## 2023-05-03 ENCOUNTER — Other Ambulatory Visit: Payer: Self-pay

## 2023-05-03 DIAGNOSIS — Z049 Encounter for examination and observation for unspecified reason: Secondary | ICD-10-CM

## 2023-05-05 ENCOUNTER — Other Ambulatory Visit (HOSPITAL_COMMUNITY): Payer: Self-pay | Admitting: Emergency Medicine

## 2023-05-05 ENCOUNTER — Encounter (HOSPITAL_COMMUNITY): Payer: Self-pay

## 2023-05-05 DIAGNOSIS — R079 Chest pain, unspecified: Secondary | ICD-10-CM

## 2023-05-05 MED ORDER — METOPROLOL TARTRATE 100 MG PO TABS: 100.0000 mg | ORAL_TABLET | Freq: Once | ORAL | 0 refills | Status: DC

## 2023-05-05 MED ORDER — IVABRADINE HCL 5 MG PO TABS: 15.0000 mg | ORAL_TABLET | Freq: Once | ORAL | 0 refills | Status: AC

## 2023-05-09 ENCOUNTER — Ambulatory Visit: Admission: RE | Admit: 2023-05-09 | Payer: MEDICAID | Source: Ambulatory Visit

## 2023-05-11 ENCOUNTER — Ambulatory Visit: Payer: MEDICAID | Admitting: Orthopedic Surgery

## 2023-05-12 ENCOUNTER — Encounter (HOSPITAL_COMMUNITY): Payer: Self-pay

## 2023-05-16 ENCOUNTER — Ambulatory Visit: Payer: MEDICAID
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
## 2023-05-16 NOTE — Progress Notes (Unsigned)
PROVIDER NOTE: Interpretation of information contained herein should be left to medically-trained personnel. Specific patient instructions are provided elsewhere under "Patient Instructions" section of medical record. This document was created in part using STT-dictation technology, any transcriptional errors that may result from this process are unintentional.  Patient: Brenda Simmons Type: Established DOB: Nov 01, 1975 MRN: [id number] PCP: Sandrea Hughs, NP  Service: Procedure DOS: 05/17/2023 Setting: Ambulatory Location: Ambulatory outpatient facility Delivery: Face-to-face Provider: Oswaldo Done, MD Specialty: Interventional Pain Management Specialty designation: 09 Location: Outpatient facility Ref. Prov.: Sandrea Hughs, NP       Interventional Therapy   Procedure: Cervical Epidural Steroid injection (CESI) (Interlaminar) #1  Laterality: Right  Level: C7-T1 Imaging: Fluoroscopy-assisted DOS: 05/17/2023  Performed by: Delano Metz, MD Anesthesia: Local anesthesia (1-2% Lidocaine) Anxiolysis: IV Versed 2.0 mg Sedation: Moderate Sedation Fentanyl 1 mL (50 mcg)   Purpose: Diagnostic/Therapeutic Indications: Cervicalgia, cervical radicular pain, degenerative disc disease, severe enough to impact quality of life or function. 1. Chronic neck pain (2ry area of Pain) (Bilateral) (R>L)   2. Chronic upper extremity pain (Left)   3. Chronic shoulder pain (Right)   4. DDD (degenerative disc disease), cervical (C5-C7)   5. Cervical radiculitis   6. Cervical foraminal stenosis  (Right: C3-4) (Bilateral: C4-5, C5-6) (Left: C6-7)   7. Herniated cervical disc   8. Abnormal MRI, cervical spine (05/07/2022)   9. Abnormal CT scan, cervical spine (01/24/2022)    NAS-11 score:   Pre-procedure: 8 /10   Post-procedure: 0-No pain/10      Position  Prep  Materials:  Location setting: Procedure suite Position: Prone, on modified reverse trendelenburg to facilitate breathing, with  head in head-cradle. Pillows positioned under chest (below chin-level) with cervical spine flexed. Safety Precautions: Patient was assessed for positional comfort and pressure points before starting the procedure. Prepping solution: DuraPrep (Iodine Povacrylex [0.7% available iodine] and Isopropyl Alcohol, 74% w/w) Prep Area: Entire  cervicothoracic region Approach: percutaneous, paramedial Intended target: Posterior cervical epidural space Materials Procedure:  Tray: Epidural Needle(s): Epidural (Tuohy) Qty: 1 Length: (90mm) 3.5-inch Gauge: 17G   H&P (Pre-op Assessment):  Brenda Simmons is a 47 y.o. (year old), female patient, seen today for interventional treatment. She  has a past surgical history that includes Other surgical history (Left, ankle surgery); Salpingectomy (Bilateral); Ankle surgery (Left); Breast cyst aspiration (Right, age 44); and Tubal ligation (Bilateral). Brenda Simmons has a current medication list which includes the following prescription(s): albuterol, aspirin-salicylamide-caffeine, atorvastatin, cetirizine, cyclobenzaprine, escitalopram, fluticasone, rightest gs550 blood glucose, ibuprofen, lisinopril, meclizine, omeprazole, ondansetron, propranolol er, rightest gl300 lancets, sitagliptin-metformin, sumatriptan, trazodone, vitamin d (ergocalciferol), gabapentin, and metoprolol tartrate, and the following Facility-Administered Medications: fentanyl and lactated ringers. Her primarily concern today is the Neck Pain  Initial Vital Signs:  Pulse/HCG Rate: 78ECG Heart Rate: 73 (NSR) Temp: (!) 97.2 F (36.2 C) Resp: 14 BP: (!) 131/92 SpO2: 100 %  BMI: Estimated body mass index is 37.59 kg/m as calculated from the following:   Height as of this encounter: 5\' 4"  (1.626 m).   Weight as of this encounter: 219 lb (99.3 kg).  Risk Assessment: Allergies: Reviewed. She has No Known Allergies.  Allergy Precautions: None required Coagulopathies: Reviewed. None identified.   Blood-thinner therapy: None at this time Active Infection(s): Reviewed. None identified. Brenda Simmons is afebrile  Site Confirmation: Brenda Simmons was asked to confirm the procedure and laterality before marking the site Procedure checklist: Completed Consent: Before the procedure and under the influence of no sedative(s), amnesic(s), or anxiolytics, the patient  was informed of the treatment options, risks and possible complications. To fulfill our ethical and legal obligations, as recommended by the American Medical Association's Code of Ethics, I have informed the patient of my clinical impression; the nature and purpose of the treatment or procedure; the risks, benefits, and possible complications of the intervention; the alternatives, including doing nothing; the risk(s) and benefit(s) of the alternative treatment(s) or procedure(s); and the risk(s) and benefit(s) of doing nothing. The patient was provided information about the general risks and possible complications associated with the procedure. These may include, but are not limited to: failure to achieve desired goals, infection, bleeding, organ or nerve damage, allergic reactions, paralysis, and death. In addition, the patient was informed of those risks and complications associated to Spine-related procedures, such as failure to decrease pain; infection (i.e.: Meningitis, epidural or intraspinal abscess); bleeding (i.e.: epidural hematoma, subarachnoid hemorrhage, or any other type of intraspinal or peri-dural bleeding); organ or nerve damage (i.e.: Any type of peripheral nerve, nerve root, or spinal cord injury) with subsequent damage to sensory, motor, and/or autonomic systems, resulting in permanent pain, numbness, and/or weakness of one or several areas of the body; allergic reactions; (i.e.: anaphylactic reaction); and/or death. Furthermore, the patient was informed of those risks and complications associated with the medications. These  include, but are not limited to: allergic reactions (i.e.: anaphylactic or anaphylactoid reaction(s)); adrenal axis suppression; blood sugar elevation that in diabetics may result in ketoacidosis or comma; water retention that in patients with history of congestive heart failure may result in shortness of breath, pulmonary edema, and decompensation with resultant heart failure; weight gain; swelling or edema; medication-induced neural toxicity; particulate matter embolism and blood vessel occlusion with resultant organ, and/or nervous system infarction; and/or aseptic necrosis of one or more joints. Finally, the patient was informed that Medicine is not an exact science; therefore, there is also the possibility of unforeseen or unpredictable risks and/or possible complications that may result in a catastrophic outcome. The patient indicated having understood very clearly. We have given the patient no guarantees and we have made no promises. Enough time was given to the patient to ask questions, all of which were answered to the patient's satisfaction. Ms. Mangine has indicated that she wanted to continue with the procedure. Attestation: I, the ordering provider, attest that I have discussed with the patient the benefits, risks, side-effects, alternatives, likelihood of achieving goals, and potential problems during recovery for the procedure that I have provided informed consent. Date  Time: 05/17/2023  8:30 AM   Pre-Procedure Preparation:  Monitoring: As per clinic protocol. Respiration, ETCO2, SpO2, BP, heart rate and rhythm monitor placed and checked for adequate function Safety Precautions: Patient was assessed for positional comfort and pressure points before starting the procedure. Time-out: I initiated and conducted the "Time-out" before starting the procedure, as per protocol. The patient was asked to participate by confirming the accuracy of the "Time Out" information. Verification of the correct  person, site, and procedure were performed and confirmed by me, the nursing staff, and the patient. "Time-out" conducted as per Joint Commission's Universal Protocol (UP.01.01.01). Time: 0904 Start Time: 0904 hrs.  Description  Narrative of Procedure:          Rationale (medical necessity): procedure needed and proper for the diagnosis and/or treatment of the patient's medical symptoms and needs. Start Time: 0904 hrs. Safety Precautions: Aspiration looking for blood return was conducted prior to all injections. At no point did we inject any substances, as a needle  was being advanced. No attempts were made at seeking any paresthesias. Safe injection practices and needle disposal techniques used. Medications properly checked for expiration dates. SDV (single dose vial) medications used. Description of procedure: Protocol guidelines were followed. The patient was assisted into a comfortable position. The target area was identified and the area prepped in the usual manner. Skin & deeper tissues infiltrated with local anesthetic. Appropriate amount of time allowed to pass for local anesthetics to take effect. Using fluoroscopic guidance, the epidural needle was introduced through the skin, ipsilateral to the reported pain, and advanced to the target area. Posterior laminar os was contacted and the needle walked caudad, until the lamina was cleared. The ligamentum flavum was engaged and the epidural space identified using "loss-of-resistance technique" with 2-3 ml of PF-NaCl (0.9% NSS), in a 5cc dedicated LOR syringe. (See "Imaging guidance" below for use of contrast details.) Once proper needle placement was secured, and negative aspiration confirmed, the solution was injected in intermittent fashion, asking for systemic symptoms every 0.5cc. The needles were then removed and the area cleansed, making sure to leave some of the prepping solution back to take advantage of its long term bactericidal  properties.  Vitals:   05/17/23 0904 05/17/23 0909 05/17/23 0911 05/17/23 0918  BP: (!) 137/90 (!) 131/91 128/87 127/82  Pulse:      Resp: 16 15 17 13   Temp:    98.1 F (36.7 C)  TempSrc:    Temporal  SpO2: 100% 100% 99% 99%  Weight:      Height:         End Time: 0911 hrs.  Imaging Guidance (Spinal):          Type of Imaging Technique: Fluoroscopy Guidance (Spinal) Indication(s): Assistance in needle guidance and placement for procedures requiring needle placement in or near specific anatomical locations not easily accessible without such assistance. Exposure Time: Please see nurses notes. Contrast: Before injecting any contrast, we confirmed that the patient did not have an allergy to iodine, shellfish, or radiological contrast. Once satisfactory needle placement was completed at the desired level, radiological contrast was injected. Contrast injected under live fluoroscopy. No contrast complications. See chart for type and volume of contrast used. Fluoroscopic Guidance: I was personally present during the use of fluoroscopy. "Tunnel Vision Technique" used to obtain the best possible view of the target area. Parallax error corrected before commencing the procedure. "Direction-depth-direction" technique used to introduce the needle under continuous pulsed fluoroscopy. Once target was reached, antero-posterior, oblique, and lateral fluoroscopic projection used confirm needle placement in all planes. Images permanently stored in EMR. Interpretation: I personally interpreted the imaging intraoperatively. Adequate needle placement confirmed in multiple planes. Appropriate spread of contrast into desired area was observed. No evidence of afferent or efferent intravascular uptake. No intrathecal or subarachnoid spread observed. Permanent images saved into the patient's record.  Post-operative Assessment:  Post-procedure Vital Signs:  Pulse/HCG Rate: 7872 Temp: 98.1 F (36.7 C) Resp: 13 BP:  127/82 SpO2: 99 %  EBL: None  Complications: No immediate post-treatment complications observed by team, or reported by patient.  Note: The patient tolerated the entire procedure well. A repeat set of vitals were taken after the procedure and the patient was kept under observation following institutional policy, for this type of procedure. Post-procedural neurological assessment was performed, showing return to baseline, prior to discharge. The patient was provided with post-procedure discharge instructions, including a section on how to identify potential problems. Should any problems arise concerning this procedure, the patient  was given instructions to immediately contact us, at any time, without hesitation. In any case, we plan to contact the patient by telephone for a follow-up status report regarding this interventional procedure.  Comments:  No additional relevant information.  Plan of Care (POC)  Orders:  Orders Placed This Encounter  Procedures   Cervical Epidural Injection    Indication(s): Radiculitis and cervicalgia associated with cervical degenerative disc disease. Position: Prone Imaging guidance: Fluoroscopy required. Contrast required unless contraindicated by allergy or severe CKD. Equipment & Materials: Epidural tray & needle.    Scheduling Instructions:     Procedure: Cervical Epidural Steroid Injection/Block     Planned Level(s): C7-T1     Laterality: Right-sided     Anxiolysis: Patient's choice.     Timeframe: Today    Order Specific Question:   Where will this procedure be performed?    Answer:   [facility]    Comments:   by Dr. Conception [facility] C-ARM 1-60 MIN NO REPORT    Intraoperative interpretation by procedural physician at [facility].    Standing Status:   Standing    Number of Occurrences:   1    Order Specific Question:   Reason for exam:    Answer:   Assistance in needle guidance and placement for procedures requiring  needle placement in or near specific anatomical locations not easily accessible without such assistance.   Informed Consent Details: Physician/Practitioner Attestation; Transcribe to consent form and obtain patient signature    Nursing instructions: Transcribe to consent form and obtain patient signature. Always confirm laterality of pain with Ms. Ladouceur, before procedure.    Order Specific Question:   Physician/Practitioner attestation of informed consent for procedure/surgical case    Answer:   I, the physician/practitioner, attest that I have discussed with the patient the benefits, risks, side effects, alternatives, likelihood of achieving goals and potential problems during recovery for the procedure that I have provided informed consent.    Order Specific Question:   Procedure    Answer:   Cervical Epidural Steroid Injection (CESI) under fluoroscopic guidance    Order Specific Question:   Physician/Practitioner performing the procedure    Answer:   Johni Narine A. Laban Emperor MD    Order Specific Question:   Indication/Reason    Answer:   Indications: Cervicalgia (neck pain), cervical radicular pain, radiculitis (arm/shoulder pain, numbness, and/or weakness), degenerative disc disease, severe enough to greatly impact quality of life or function.   Provide equipment / supplies at bedside    Procedural tray: Epidural Tray (Disposable  single use) Skin infiltration needle: Regular 1.5-in, 25-G, (x1) Block needle size: Regular standard Catheter: No catheter required    Standing Status:   Standing    Number of Occurrences:   1    Order Specific Question:   Specify    Answer:   Epidural Tray   Chronic Opioid Analgesic:  None MME/day: 0 mg/day   Medications ordered for procedure: Meds ordered this encounter  Medications   iohexol (OMNIPAQUE) 180 MG/ML injection 10 mL    Must be Myelogram-compatible. If not available, you may substitute with a water-soluble, non-ionic, hypoallergenic,  myelogram-compatible radiological contrast medium.   lidocaine (XYLOCAINE) 2 % (with pres) injection 400 mg   pentafluoroprop-tetrafluoroeth (GEBAUERS) aerosol   lactated ringers infusion   midazolam (VERSED) 5 MG/5ML injection 0.5-2 mg    Make sure Flumazenil is available in the pyxis when using this medication. If oversedation occurs, administer 0.2 mg IV  over 15 sec. If after 45 sec no response, administer 0.2 mg again over 1 min; may repeat at 1 min intervals; not to exceed 4 doses (1 mg)   fentaNYL (SUBLIMAZE) injection 25-50 mcg    Make sure Narcan is available in the pyxis when using this medication. In the event of respiratory depression (RR< 8/min): Titrate NARCAN (naloxone) in increments of 0.1 to 0.2 mg IV at 2-3 minute intervals, until desired degree of reversal.   sodium chloride flush (NS) 0.9 % injection 1 mL   ropivacaine (PF) 2 mg/mL (0.2%) (NAROPIN) injection 1 mL   dexamethasone (DECADRON) injection 10 mg   Medications administered: We administered iohexol, lidocaine, pentafluoroprop-tetrafluoroeth, lactated ringers, midazolam, fentaNYL, sodium chloride flush, ropivacaine (PF) 2 mg/mL (0.2%), and dexamethasone.  See the medical record for exact dosing, route, and time of administration.  Follow-up plan:   Return in about 2 weeks (around 05/31/2023) for (Face2F), (PPE).       Interventional Therapies  Risk Factors  Considerations:  MO  T2NIDDM  HTN  Anxiety   Planned  Pending:       Under consideration:   Diagnostic bilateral (L4-5, L5-S1) lumbar facet (L3-S1) MBB #2  Possible bilateral lumbar facet RFA #1  Diagnostic bilateral SI joint Blk #1  Diagnostic bilateral IA hip joint injection #1  Diagnostic bilateral IA knee joint injection #1  Diagnostic bilateral cervical facet MBB #1    Completed:   Diagnostic bilateral sacroiliac joint Blk x1 (80/0/0/0)  Diagnostic bilateral (L4-5, L5-S1) lumbar facet (L3-S1) MBB x1 (100/80/0/0)    Completed by other  providers:   None at this time   Therapeutic  Palliative (PRN) options:   None established          Recent Visits Date Type Provider Dept  04/26/23 Procedure visit Delano Metz, MD [facility]  04/11/23 Office Visit Delano Metz, MD [facility]  03/29/23 Office Visit Delano Metz, MD [facility]  03/10/23 Procedure visit Delano Metz, MD [facility]  Showing recent visits within past 90 days and meeting all other requirements Today's Visits Date Type Provider Dept  05/17/23 Procedure visit Delano Metz, MD [facility]  Showing today's visits and meeting all other requirements Future Appointments Date Type Provider Dept  06/02/23 Appointment Delano Metz, MD [facility]  Showing future appointments within next 90 days and meeting all other requirements  Disposition: Discharge home  Discharge (Date  Time): 05/17/2023;   hrs.   Primary Care Physician: Sandrea Hughs, NP Location: [facility] Note by: Oswaldo Done, MD (TTS technology used. I apologize for any typographical errors that were not detected and corrected.) Date: 05/17/2023; Time: 9:27 AM  Disclaimer:  Medicine is not an Visual merchandiser. The only guarantee in medicine is that nothing is guaranteed. It is important to note that the decision to proceed with this intervention was based on the information collected from the patient. The Data and conclusions were drawn from the patient's questionnaire, the interview, and the physical examination. Because the information was provided in large part by the patient, it cannot be guaranteed that it has not been purposely or unconsciously manipulated. Every effort has been made to obtain as much relevant data as possible for this evaluation. It is important to note that the conclusions that lead to this procedure are derived in large part from the  available data. Always take into account that the treatment will also be dependent on availability of resources and  existing treatment guidelines, considered by other Pain Management Practitioners as being common knowledge and practice, at the time of the intervention. For Medico-Legal purposes, it is also important to point out that variation in procedural techniques and pharmacological choices are the acceptable norm. The indications, contraindications, technique, and results of the above procedure should only be interpreted and judged by a Board-Certified Interventional Pain Specialist with extensive familiarity and expertise in the same exact procedure and technique.

## 2023-05-16 NOTE — Patient Instructions (Signed)

## 2023-05-17 ENCOUNTER — Encounter: Payer: Self-pay | Admitting: Pain Medicine

## 2023-05-17 ENCOUNTER — Ambulatory Visit
Admission: RE | Admit: 2023-05-17 | Discharge: 2023-05-17 | Disposition: A | Discharge status: Home / Self Care | Payer: MEDICAID | Source: Ambulatory Visit | Attending: Pain Medicine | Admitting: Pain Medicine

## 2023-05-17 ENCOUNTER — Ambulatory Visit: Payer: MEDICAID | Attending: Pain Medicine | Admitting: Pain Medicine

## 2023-05-17 VITALS — BP 119/83 | HR 78 | Temp 97.2°F | Resp 15 | Ht 64.0 in | Wt 219.0 lb

## 2023-05-17 DIAGNOSIS — M5412 Radiculopathy, cervical region: Secondary | ICD-10-CM | POA: Diagnosis present

## 2023-05-17 DIAGNOSIS — R937 Abnormal findings on diagnostic imaging of other parts of musculoskeletal system: Secondary | ICD-10-CM

## 2023-05-17 DIAGNOSIS — M5020 Other cervical disc displacement, unspecified cervical region: Secondary | ICD-10-CM | POA: Diagnosis present

## 2023-05-17 DIAGNOSIS — M25511 Pain in right shoulder: Secondary | ICD-10-CM | POA: Insufficient documentation

## 2023-05-17 DIAGNOSIS — M5030 Other cervical disc degeneration, unspecified cervical region: Secondary | ICD-10-CM | POA: Diagnosis not present

## 2023-05-17 DIAGNOSIS — M4802 Spinal stenosis, cervical region: Secondary | ICD-10-CM | POA: Diagnosis present

## 2023-05-17 DIAGNOSIS — M542 Cervicalgia: Secondary | ICD-10-CM | POA: Insufficient documentation

## 2023-05-17 DIAGNOSIS — M79602 Pain in left arm: Secondary | ICD-10-CM | POA: Insufficient documentation

## 2023-05-17 DIAGNOSIS — G8929 Other chronic pain: Secondary | ICD-10-CM | POA: Diagnosis present

## 2023-05-17 MED ORDER — FENTANYL CITRATE (PF) 100 MCG/2ML IJ SOLN
25.0000 ug | INTRAMUSCULAR | Status: DC | PRN
  Administered 2023-05-17: 50 ug via INTRAVENOUS

## 2023-05-17 MED ORDER — ROPIVACAINE HCL 2 MG/ML IJ SOLN
1.0000 mL | Freq: Once | INTRAMUSCULAR | Status: AC
  Administered 2023-05-17: 1 mL via EPIDURAL

## 2023-05-17 MED ORDER — ROPIVACAINE HCL 2 MG/ML IJ SOLN
INTRAMUSCULAR | Status: AC
  Filled 2023-05-17: qty 20

## 2023-05-17 MED ORDER — MIDAZOLAM HCL 5 MG/5ML IJ SOLN
0.5000 mg | Freq: Once | INTRAMUSCULAR | Status: AC
  Administered 2023-05-17: 2 mg via INTRAVENOUS

## 2023-05-17 MED ORDER — FENTANYL CITRATE (PF) 100 MCG/2ML IJ SOLN
INTRAMUSCULAR | Status: AC
  Filled 2023-05-17: qty 2

## 2023-05-17 MED ORDER — SODIUM CHLORIDE 0.9% FLUSH
1.0000 mL | Freq: Once | INTRAVENOUS | Status: AC
  Administered 2023-05-17: 1 mL

## 2023-05-17 MED ORDER — DEXAMETHASONE SODIUM PHOSPHATE 10 MG/ML IJ SOLN
10.0000 mg | Freq: Once | INTRAMUSCULAR | Status: AC
  Administered 2023-05-17: 10 mg

## 2023-05-17 MED ORDER — LIDOCAINE HCL (PF) 2 % IJ SOLN
INTRAMUSCULAR | Status: AC
  Filled 2023-05-17: qty 10

## 2023-05-17 MED ORDER — LACTATED RINGERS IV SOLN: Freq: Once | INTRAVENOUS | Status: AC

## 2023-05-17 MED ORDER — IOHEXOL 180 MG/ML  SOLN
10.0000 mL | Freq: Once | INTRAMUSCULAR | Status: AC
  Administered 2023-05-17: 10 mL via EPIDURAL

## 2023-05-17 MED ORDER — PENTAFLUOROPROP-TETRAFLUOROETH EX AERO
INHALATION_SPRAY | Freq: Once | CUTANEOUS | Status: AC
  Administered 2023-05-17: 30 via TOPICAL
  Filled 2023-05-17: qty 30

## 2023-05-17 MED ORDER — SODIUM CHLORIDE (PF) 0.9 % IJ SOLN
INTRAMUSCULAR | Status: AC
  Filled 2023-05-17: qty 10

## 2023-05-17 MED ORDER — LIDOCAINE HCL 2 % IJ SOLN
20.0000 mL | Freq: Once | INTRAMUSCULAR | Status: AC
  Administered 2023-05-17: 100 mg

## 2023-05-17 MED ORDER — DEXAMETHASONE SODIUM PHOSPHATE 10 MG/ML IJ SOLN
INTRAMUSCULAR | Status: AC
  Filled 2023-05-17: qty 1

## 2023-05-17 MED ORDER — IOHEXOL 180 MG/ML  SOLN
INTRAMUSCULAR | Status: AC
  Filled 2023-05-17: qty 10

## 2023-05-17 MED ORDER — MIDAZOLAM HCL 5 MG/5ML IJ SOLN
INTRAMUSCULAR | Status: AC
  Filled 2023-05-17: qty 5

## 2023-05-18 ENCOUNTER — Telehealth: Payer: Self-pay | Admitting: *Deleted

## 2023-05-18 NOTE — Telephone Encounter (Signed)
Post procedure call:   no  questions or concerns.  

## 2023-05-26 ENCOUNTER — Encounter: Payer: Self-pay | Admitting: Orthopedic Surgery

## 2023-05-26 ENCOUNTER — Ambulatory Visit: Payer: MEDICAID | Admitting: Orthopedic Surgery

## 2023-05-26 VITALS — Ht 64.0 in | Wt 220.6 lb

## 2023-05-26 DIAGNOSIS — M50321 Other cervical disc degeneration at C4-C5 level: Secondary | ICD-10-CM

## 2023-05-26 DIAGNOSIS — M25511 Pain in right shoulder: Secondary | ICD-10-CM

## 2023-05-26 DIAGNOSIS — M47816 Spondylosis without myelopathy or radiculopathy, lumbar region: Secondary | ICD-10-CM

## 2023-05-26 DIAGNOSIS — M4722 Other spondylosis with radiculopathy, cervical region: Secondary | ICD-10-CM | POA: Diagnosis not present

## 2023-05-26 DIAGNOSIS — M5030 Other cervical disc degeneration, unspecified cervical region: Secondary | ICD-10-CM

## 2023-05-26 DIAGNOSIS — M50323 Other cervical disc degeneration at C6-C7 level: Secondary | ICD-10-CM

## 2023-05-26 DIAGNOSIS — M47812 Spondylosis without myelopathy or radiculopathy, cervical region: Secondary | ICD-10-CM

## 2023-05-26 DIAGNOSIS — M5412 Radiculopathy, cervical region: Secondary | ICD-10-CM

## 2023-05-26 DIAGNOSIS — M50322 Other cervical disc degeneration at C5-C6 level: Secondary | ICD-10-CM

## 2023-05-26 DIAGNOSIS — M4726 Other spondylosis with radiculopathy, lumbar region: Secondary | ICD-10-CM | POA: Diagnosis not present

## 2023-05-26 DIAGNOSIS — M5416 Radiculopathy, lumbar region: Secondary | ICD-10-CM

## 2023-05-26 DIAGNOSIS — M4802 Spinal stenosis, cervical region: Secondary | ICD-10-CM

## 2023-05-26 DIAGNOSIS — M4316 Spondylolisthesis, lumbar region: Secondary | ICD-10-CM

## 2023-05-26 DIAGNOSIS — E1140 Type 2 diabetes mellitus with diabetic neuropathy, unspecified: Secondary | ICD-10-CM

## 2023-05-26 NOTE — Patient Instructions (Signed)
It was so nice to see you today. Thank you so much for coming in.    You have some wear and tear (arthritis) in your neck and your back.   I think your right shoulder pain is coming from your shoulder.   I want to get an MRI of your neck and lower back to look into things further. We will get this approved through your insurance and [facility] will call you to schedule the appointment.   [facility] (building with the white pillars) is located off of [address]. The address is [address], [place], [place] [address].   I sent physical therapy orders to [facility]. You can call them at [phone] if you don't hear from them to schedule your visit.   After you have the MRI, it takes 10-14 days for me to get the results back. Once I have them, we will call you to schedule a follow up phone visit with me to review them.   Continue to work on quitting smoking- you can do it!  Please do not hesitate to call if you have any questions or concerns. You can also message me in MyChart.   Drake Leach PA-C [phone]

## 2023-05-31 NOTE — Progress Notes (Unsigned)
PROVIDER NOTE: Information contained herein reflects review and annotations entered in association with encounter. Interpretation of such information and data should be left to medically-trained personnel. Information provided to patient can be located elsewhere in the medical record under "Patient Instructions". Document created using STT-dictation technology, any transcriptional errors that may result from process are unintentional.    Patient: Brenda Simmons  Service Category: E/M  Provider: Oswaldo Done, MD  DOB: 1976-05-14  DOS: 06/02/2023  Referring Provider: Sandrea Hughs, NP  MRN: [id number]  Specialty: Interventional Pain Management  PCP: Sandrea Hughs, NP  Type: Established Patient  Setting: Ambulatory outpatient    Location: Office  Delivery: Face-to-face     HPI  [name], a 47 y.o. year old female, is here today because of her Chronic neck pain [M54.2, G89.29]. [name]'s primary complain today is No chief complaint on file.  Pertinent problems: [name] has Disorder of rotator cuff; Chronic neck pain (2ry area of Pain) (Bilateral) (R>L); Chronic shoulder pain (Right); Migraine headache without aura; Migrainous dizziness; Right-sided low back pain with right-sided sciatica; Bilateral lower extremity pain; Chronic tension-type headache, not intractable; Herniated cervical disc; Osteoarthritis of spine with radiculopathy, cervical region; Right rotator cuff tear; Shoulder tendinitis, right; Abnormal CT scan, cervical spine (01/24/2022); DDD (degenerative disc disease), cervical (C5-C7); Chronic pain syndrome; Chronic low back pain (1ry area of Pain) (Bilateral) (R>L) w/o sciatica; Chronic hip pain (3ry area of Pain) (Bilateral) (R>L); Chronic knee pain (4th area of Pain) (Bilateral) (R>L); Chronic lower extremity pain (5th area of Pain) (Bilateral) (L>R); History of ankle surgery old (2006) (Left); Numbness of lower extremity (Intermittent) (Bilateral); DDD  (degenerative disc disease), lumbosacral; Osteoarthritis of knee (Right); Osteoarthritis of hips (Bilateral); Osteoarthritis of sacroiliac joints (Bilateral) (HCC); Cervical facet hypertrophy (Multilevel) (Bilateral); Cervical foraminal stenosis  (Right: C3-4) (Bilateral: C4-5, C5-6) (Left: C6-7); Grade 1 (5 mm) anterolisthesis of lumbar spine (L4/L5); Lumbar facet joint arthropathy (L4-5); Lumbosacral facet joint syndrome; Spondylosis without myelopathy or radiculopathy, lumbosacral region; Lumbar facet joint pain; Chronic sacroiliac joint pain (Bilateral) (R>L); Disorder of sacroiliac joints (Bilateral); Somatic dysfunction of sacroiliac joints (Bilateral); Other spondylosis, sacral and sacrococcygeal region; Abnormal MRI, lumbar spine (05/07/2022); Abnormal MRI, cervical spine (05/07/2022); Chronic upper extremity pain (Left); Cervical radiculitis; and Acute neck pain on their pertinent problem list. Pain Assessment: Severity of   is reported as a  /10. Location:    / . Onset:  . Quality:  . Timing:  . Modifying factor(s):  Marland Kitchen Vitals:  vitals were not taken for this visit.  BMI: Estimated body mass index is 37.87 kg/m as calculated from the following:   Height as of 05/26/23: 5\' 4"  (1.626 m).   Weight as of 05/26/23: 220 lb 9.6 oz (100.1 kg). Last encounter: 04/11/2023. Last procedure: 05/17/2023.  Reason for encounter: post-procedure evaluation and assessment. ***  Post-procedure evaluation   Procedure: Cervical Epidural Steroid injection (CESI) (Interlaminar) #1  Laterality: Right  Level: C7-T1 Imaging: Fluoroscopy-assisted DOS: 05/17/2023  Performed by: Delano Metz, MD Anesthesia: Local anesthesia (1-2% Lidocaine) Anxiolysis: IV Versed 2.0 mg Sedation: Moderate Sedation Fentanyl 1 mL (50 mcg)   Purpose: Diagnostic/Therapeutic Indications: Cervicalgia, cervical radicular pain, degenerative disc disease, severe enough to impact quality of life or function. 1. Chronic neck pain (2ry area  of Pain) (Bilateral) (R>L)   2. Chronic upper extremity pain (Left)   3. Chronic shoulder pain (Right)   4. DDD (degenerative disc disease), cervical (C5-C7)   5. Cervical radiculitis   6. Cervical foraminal  stenosis  (Right: C3-4) (Bilateral: C4-5, C5-6) (Left: C6-7)   7. Herniated cervical disc   8. Abnormal MRI, cervical spine (05/07/2022)   9. Abnormal CT scan, cervical spine (01/24/2022)    NAS-11 score:   Pre-procedure: 8 /10   Post-procedure: 0-No pain/10      Effectiveness:  Initial hour after procedure:   ***. Subsequent 4-6 hours post-procedure:   ***. Analgesia past initial 6 hours:   ***. Ongoing improvement:  Analgesic:  *** Function:    ***    ROM:    ***     Pharmacotherapy Assessment  Analgesic: None MME/day: 0 mg/day   Monitoring: [place] PMP: PDMP reviewed during this encounter.       Pharmacotherapy: No side-effects or adverse reactions reported. Compliance: No problems identified. Effectiveness: Clinically acceptable.  No notes on file  No results found for: "CBDTHCR" No results found for: "D8THCCBX" No results found for: "D9THCCBX"  UDS:  Summary  Date Value Ref Range Status  10/18/2022 Note  Final    Comment:    ==================================================================== Compliance Drug Analysis, Ur ==================================================================== Test                             Result       Flag       Units  Drug Present and Declared for Prescription Verification   Gabapentin                     PRESENT      EXPECTED   Salicylate                     PRESENT      EXPECTED   Propranolol                    PRESENT      EXPECTED  Drug Present not Declared for Prescription Verification   Naproxen                       PRESENT      UNEXPECTED  Drug Absent but Declared for Prescription Verification   Fluoxetine                     Not Detected UNEXPECTED   Quetiapine                     Not Detected UNEXPECTED    Ibuprofen                      Not Detected UNEXPECTED    Ibuprofen, as indicated in the declared medication list, is not    always detected even when used as directed.  ==================================================================== Test                      Result    Flag   Units      Ref Range   Creatinine              139              mg/dL      >=62 ==================================================================== Declared Medications:  The flagging and interpretation on this report are based on the  following declared medications.  Unexpected results may arise from  inaccuracies in the declared medications.   **Note: The testing scope of this panel includes these medications:   Fluoxetine (  Prozac)  Gabapentin (Neurontin)  Propranolol  Quetiapine (Seroquel)   **Note: The testing scope of this panel does not include small to  moderate amounts of these reported medications:   Aspirin  Ibuprofen (Advil)  Salicylate (Salicylamide)   **Note: The testing scope of this panel does not include the  following reported medications:   Albuterol  Caffeine  Cetirizine (Zyrtec)  Fluticasone (Flonase)  Metformin (Janumet)  Montelukast  Multivitamin  Omeprazole  Ondansetron  Sitagliptin (Janumet) ==================================================================== For clinical consultation, please call [phone]. ====================================================================       ROS  Constitutional: Denies any fever or chills Gastrointestinal: No reported hemesis, hematochezia, vomiting, or acute GI distress Musculoskeletal: Denies any acute onset joint swelling, redness, loss of ROM, or weakness Neurological: No reported episodes of acute onset apraxia, aphasia, dysarthria, agnosia, amnesia, paralysis, loss of coordination, or loss of consciousness  Medication Review  Aspirin-Salicylamide-Caffeine, Rightest GL300 Lancets, SUMAtriptan, Vitamin D  (Ergocalciferol), albuterol, atorvastatin, cetirizine, cyclobenzaprine, escitalopram, fluticasone, gabapentin, glucose blood, ibuprofen, lisinopril, meclizine, metoprolol tartrate, omeprazole, ondansetron, propranolol ER, sitaGLIPtin-metformin, and traZODone  History Review  Allergy: [name] has No Known Allergies. Drug: [name]  reports no history of drug use. Alcohol:  reports current alcohol use. Tobacco:  reports that she has been smoking cigarettes. She has a 3.8 pack-year smoking history. She has never used smokeless tobacco. Social: [name]  reports that she has been smoking cigarettes. She has a 3.8 pack-year smoking history. She has never used smokeless tobacco. She reports current alcohol use. She reports that she does not use drugs. Medical:  has a past medical history of Anxiety, Breast mass (x 3 mos), Bulging lumbar disc, Chronic bronchitis (HCC), Depression, Diabetes mellitus without complication (HCC), Hypertension, Migraines, Partial tear of rotator cuff, and Pre-diabetes. Surgical: [name]  has a past surgical history that includes Other surgical history (Left, ankle surgery); Salpingectomy (Bilateral); Ankle surgery (Left); Breast cyst aspiration (Right, age 50); and Tubal ligation (Bilateral). Family: family history includes Cancer in her maternal grandmother and paternal grandmother; Depression in her brother and mother; Diabetes in her mother; Heart failure in her mother; Hypertension in her father, mother, and sister.  Laboratory Chemistry Profile   Renal Lab Results  Component Value Date   BUN 10 03/16/2023   CREATININE 0.71 03/16/2023   BCR 8 (L) 10/18/2022   GFRAA >60 07/28/2016   GFRNONAA >60 03/16/2023    Hepatic Lab Results  Component Value Date   AST 14 10/18/2022   ALT 11 (L) 07/28/2016   ALBUMIN 3.8 (L) 10/18/2022   ALKPHOS 114 10/18/2022   LIPASE 103 09/17/2014    Electrolytes Lab Results  Component Value Date   NA 135 03/16/2023   K  4.1 03/16/2023   CL 104 03/16/2023   CALCIUM 8.4 (L) 03/16/2023   MG 1.7 10/18/2022    Bone Lab Results  Component Value Date   VD25OH 23.0 (L) 12/04/2015   25OHVITD1 9.2 (L) 10/18/2022   25OHVITD2 <1.0 10/18/2022   25OHVITD3 9.1 10/18/2022    Inflammation (CRP: Acute Phase) (ESR: Chronic Phase) Lab Results  Component Value Date   CRP 14 (H) 10/18/2022   ESRSEDRATE 7 10/18/2022         Note: Above Lab results reviewed.  Recent Imaging Review  DG PAIN CLINIC C-ARM 1-60 MIN NO REPORT Fluoro was used, but no Radiologist interpretation will be provided.  Please refer to "NOTES" tab for provider progress note. Note: Reviewed        Physical Exam  General appearance:  Well nourished, well developed, and well hydrated. In no apparent acute distress Mental status: Alert, oriented x 3 (person, place, & time)       Respiratory: No evidence of acute respiratory distress Eyes: PERLA Vitals: There were no vitals taken for this visit. BMI: Estimated body mass index is 37.87 kg/m as calculated from the following:   Height as of 05/26/23: 5\' 4"  (1.626 m).   Weight as of 05/26/23: 220 lb 9.6 oz (100.1 kg). Ideal: Ideal body weight: 54.7 kg (120 lb 9.5 oz) Adjusted ideal body weight: 72.8 kg (160 lb 9.5 oz)  Assessment   Diagnosis Status  1. Chronic neck pain (2ry area of Pain) (Bilateral) (R>L)   2. Chronic upper extremity pain (Left)   3. Chronic shoulder pain (Right)   4. Cervical radiculitis   5. Postop check    Controlled Controlled Controlled   Updated Problems: No problems updated.  Plan of Care  Problem-specific:  No problem-specific Assessment & Plan notes found for this encounter.  [name] has a current medication list which includes the following long-term medication(s): albuterol, escitalopram, fluticasone, gabapentin, lisinopril, metoprolol tartrate, omeprazole, propranolol er, sitagliptin-metformin, and sumatriptan.  Pharmacotherapy (Medications  Ordered): No orders of the defined types were placed in this encounter.  Orders:  No orders of the defined types were placed in this encounter.  Follow-up plan:   No follow-ups on file.      Interventional Therapies  Risk Factors  Considerations:  MO  T2NIDDM  HTN  Anxiety   Planned  Pending:       Under consideration:   Diagnostic bilateral (L4-5, L5-S1) lumbar facet (L3-S1) MBB #2  Possible bilateral lumbar facet RFA #1  Diagnostic bilateral SI joint Blk #1  Diagnostic bilateral IA hip joint injection #1  Diagnostic bilateral IA knee joint injection #1  Diagnostic bilateral cervical facet MBB #1    Completed:   Diagnostic bilateral sacroiliac joint Blk x1 (80/0/0/0)  Diagnostic bilateral (L4-5, L5-S1) lumbar facet (L3-S1) MBB x1 (100/80/0/0)    Completed by other providers:   None at this time   Therapeutic  Palliative (PRN) options:   None established           Recent Visits Date Type Provider Dept  05/17/23 Procedure visit Delano Metz, MD [facility]  04/26/23 Procedure visit Delano Metz, MD [facility]  04/11/23 Office Visit Delano Metz, MD [facility]  03/29/23 Office Visit Delano Metz, MD [facility]  03/10/23 Procedure visit Delano Metz, MD [facility]  Showing recent visits within past 90 days and meeting all other requirements Future Appointments Date Type Provider Dept  06/02/23 Appointment Delano Metz, MD [facility]  Showing future appointments within next 90 days and meeting all other requirements  I discussed the assessment and treatment plan with the patient. The patient was provided an opportunity to ask questions and all were answered. The patient agreed with the plan and demonstrated an understanding of the instructions.  Patient advised to call back or seek an in-person evaluation if the symptoms or condition worsens.  Duration of  encounter: *** minutes.  Total time on encounter, as per AMA guidelines included both the face-to-face and non-face-to-face time personally spent by the physician and/or other qualified health care professional(s) on the day of the encounter (includes time in activities that require the physician or other qualified health care professional and does not include time in activities normally performed by clinical staff). Physician's time may  include the following activities when performed: Preparing to see the patient (e.g., pre-charting review of records, searching for previously ordered imaging, lab work, and nerve conduction tests) Review of prior analgesic pharmacotherapies. Reviewing PMP Interpreting ordered tests (e.g., lab work, imaging, nerve conduction tests) Performing post-procedure evaluations, including interpretation of diagnostic procedures Obtaining and/or reviewing separately obtained history Performing a medically appropriate examination and/or evaluation Counseling and educating the patient/family/caregiver Ordering medications, tests, or procedures Referring and communicating with other health care professionals (when not separately reported) Documenting clinical information in the electronic or other health record Independently interpreting results (not separately reported) and communicating results to the patient/ family/caregiver Care coordination (not separately reported)  Note by: Oswaldo Done, MD Date: 06/02/2023; Time: 5:02 PM

## 2023-06-02 ENCOUNTER — Other Ambulatory Visit (HOSPITAL_COMMUNITY): Payer: Self-pay | Admitting: *Deleted

## 2023-06-02 ENCOUNTER — Ambulatory Visit: Payer: MEDICAID | Attending: Pain Medicine | Admitting: Pain Medicine

## 2023-06-02 ENCOUNTER — Encounter: Payer: Self-pay | Admitting: Pain Medicine

## 2023-06-02 ENCOUNTER — Ambulatory Visit: Admission: RE | Admit: 2023-06-02 | Payer: MEDICAID | Source: Ambulatory Visit

## 2023-06-02 VITALS — BP 121/90 | HR 102 | Temp 97.0°F | Resp 16 | Ht 64.0 in | Wt 220.0 lb

## 2023-06-02 DIAGNOSIS — M542 Cervicalgia: Secondary | ICD-10-CM | POA: Insufficient documentation

## 2023-06-02 DIAGNOSIS — M5020 Other cervical disc displacement, unspecified cervical region: Secondary | ICD-10-CM | POA: Diagnosis present

## 2023-06-02 DIAGNOSIS — M5412 Radiculopathy, cervical region: Secondary | ICD-10-CM | POA: Diagnosis present

## 2023-06-02 DIAGNOSIS — Z09 Encounter for follow-up examination after completed treatment for conditions other than malignant neoplasm: Secondary | ICD-10-CM | POA: Diagnosis present

## 2023-06-02 DIAGNOSIS — M79601 Pain in right arm: Secondary | ICD-10-CM | POA: Insufficient documentation

## 2023-06-02 DIAGNOSIS — G8929 Other chronic pain: Secondary | ICD-10-CM

## 2023-06-02 DIAGNOSIS — R937 Abnormal findings on diagnostic imaging of other parts of musculoskeletal system: Secondary | ICD-10-CM

## 2023-06-02 DIAGNOSIS — M25511 Pain in right shoulder: Secondary | ICD-10-CM | POA: Diagnosis present

## 2023-06-02 DIAGNOSIS — M5030 Other cervical disc degeneration, unspecified cervical region: Secondary | ICD-10-CM

## 2023-06-02 DIAGNOSIS — M4802 Spinal stenosis, cervical region: Secondary | ICD-10-CM | POA: Diagnosis present

## 2023-06-02 DIAGNOSIS — R079 Chest pain, unspecified: Secondary | ICD-10-CM

## 2023-06-02 MED ORDER — METOPROLOL TARTRATE 100 MG PO TABS: 100.0000 mg | ORAL_TABLET | Freq: Once | ORAL | 0 refills | Status: DC

## 2023-06-02 NOTE — Patient Instructions (Signed)

## 2023-06-07 ENCOUNTER — Encounter (HOSPITAL_COMMUNITY): Payer: Self-pay

## 2023-06-09 ENCOUNTER — Ambulatory Visit
Admission: RE | Admit: 2023-06-09 | Discharge: 2023-06-09 | Disposition: A | Discharge status: Home / Self Care | Payer: MEDICAID | Source: Ambulatory Visit | Attending: Cardiology | Admitting: Cardiology

## 2023-06-09 ENCOUNTER — Ambulatory Visit: Admission: RE | Admit: 2023-06-09 | Payer: MEDICAID | Source: Ambulatory Visit

## 2023-06-09 ENCOUNTER — Other Ambulatory Visit: Payer: Self-pay | Admitting: Cardiology

## 2023-06-09 DIAGNOSIS — R072 Precordial pain: Secondary | ICD-10-CM

## 2023-06-09 LAB — POCT I-STAT CREATININE: Creatinine, Ser: 0.7 mg/dL (ref 0.44–1.00)

## 2023-06-09 MED ORDER — IOHEXOL 350 MG/ML SOLN
100.0000 mL | Freq: Once | INTRAVENOUS | Status: AC | PRN
  Administered 2023-06-09: 100 mL via INTRAVENOUS

## 2023-06-09 MED ORDER — NITROGLYCERIN 0.4 MG SL SUBL
0.4000 mg | SUBLINGUAL_TABLET | Freq: Once | SUBLINGUAL | Status: AC
  Administered 2023-06-09: 0.4 mg via SUBLINGUAL

## 2023-06-09 MED ORDER — SODIUM CHLORIDE 0.9 % IV SOLN: INTRAVENOUS | Status: DC

## 2023-06-09 MED ORDER — IVABRADINE HCL 5 MG PO TABS
10.0000 mg | ORAL_TABLET | Freq: Once | ORAL | Status: AC
  Administered 2023-06-09: 10 mg via ORAL

## 2023-06-09 MED ORDER — IVABRADINE HCL 5 MG PO TABS
15.0000 mg | ORAL_TABLET | Freq: Once | ORAL | Status: AC
Start: 2023-06-09 — End: ?

## 2023-06-09 NOTE — Progress Notes (Signed)
Patient tolerated procedure well. Ambulate w/o difficulty. Denies light headedness or being dizzy. Drinking water provided. Encouraged to drink extra water today and reasoning explained. Verbalized understanding. All questions answered. ABC intact. No further needs. Discharge from procedure area w/o issues.

## 2023-06-16 ENCOUNTER — Ambulatory Visit
Admission: RE | Admit: 2023-06-16 | Discharge: 2023-06-16 | Disposition: A | Discharge status: Home / Self Care | Payer: MEDICAID | Source: Ambulatory Visit | Attending: Pain Medicine | Admitting: Pain Medicine

## 2023-06-16 ENCOUNTER — Encounter: Payer: Self-pay | Admitting: Pain Medicine

## 2023-06-16 ENCOUNTER — Ambulatory Visit: Payer: MEDICAID | Attending: Pain Medicine | Admitting: Pain Medicine

## 2023-06-16 VITALS — BP 105/81 | HR 73 | Temp 97.3°F | Resp 16 | Ht 64.0 in | Wt 220.0 lb

## 2023-06-16 DIAGNOSIS — M4802 Spinal stenosis, cervical region: Secondary | ICD-10-CM | POA: Diagnosis present

## 2023-06-16 DIAGNOSIS — R937 Abnormal findings on diagnostic imaging of other parts of musculoskeletal system: Secondary | ICD-10-CM | POA: Insufficient documentation

## 2023-06-16 DIAGNOSIS — M79601 Pain in right arm: Secondary | ICD-10-CM | POA: Insufficient documentation

## 2023-06-16 DIAGNOSIS — Z5189 Encounter for other specified aftercare: Secondary | ICD-10-CM | POA: Insufficient documentation

## 2023-06-16 DIAGNOSIS — M25511 Pain in right shoulder: Secondary | ICD-10-CM | POA: Insufficient documentation

## 2023-06-16 DIAGNOSIS — M5412 Radiculopathy, cervical region: Secondary | ICD-10-CM | POA: Diagnosis present

## 2023-06-16 DIAGNOSIS — M542 Cervicalgia: Secondary | ICD-10-CM | POA: Diagnosis present

## 2023-06-16 DIAGNOSIS — M5030 Other cervical disc degeneration, unspecified cervical region: Secondary | ICD-10-CM | POA: Diagnosis present

## 2023-06-16 DIAGNOSIS — G8929 Other chronic pain: Secondary | ICD-10-CM | POA: Diagnosis present

## 2023-06-16 MED ORDER — IOHEXOL 180 MG/ML  SOLN
INTRAMUSCULAR | Status: AC
  Filled 2023-06-16: qty 10

## 2023-06-16 MED ORDER — LIDOCAINE HCL 2 % IJ SOLN
20.0000 mL | Freq: Once | INTRAMUSCULAR | Status: AC
  Administered 2023-06-16: 100 mg

## 2023-06-16 MED ORDER — SODIUM CHLORIDE 0.9% FLUSH
1.0000 mL | Freq: Once | INTRAVENOUS | Status: AC
  Administered 2023-06-16: 1 mL

## 2023-06-16 MED ORDER — FENTANYL CITRATE (PF) 100 MCG/2ML IJ SOLN
INTRAMUSCULAR | Status: AC
  Filled 2023-06-16: qty 2

## 2023-06-16 MED ORDER — MIDAZOLAM HCL 5 MG/5ML IJ SOLN
0.5000 mg | Freq: Once | INTRAMUSCULAR | Status: AC
  Administered 2023-06-16: 2 mg via INTRAVENOUS

## 2023-06-16 MED ORDER — MIDAZOLAM HCL 5 MG/5ML IJ SOLN
INTRAMUSCULAR | Status: AC
  Filled 2023-06-16: qty 5

## 2023-06-16 MED ORDER — LACTATED RINGERS IV SOLN: Freq: Once | INTRAVENOUS | Status: AC

## 2023-06-16 MED ORDER — LIDOCAINE HCL (PF) 2 % IJ SOLN
INTRAMUSCULAR | Status: AC
  Filled 2023-06-16: qty 10

## 2023-06-16 MED ORDER — IOHEXOL 180 MG/ML  SOLN
10.0000 mL | Freq: Once | INTRAMUSCULAR | Status: AC
  Administered 2023-06-16: 10 mL via EPIDURAL

## 2023-06-16 MED ORDER — FENTANYL CITRATE (PF) 100 MCG/2ML IJ SOLN: 25.0000 ug | INTRAMUSCULAR | Status: DC | PRN

## 2023-06-16 MED ORDER — DEXAMETHASONE SODIUM PHOSPHATE 10 MG/ML IJ SOLN
10.0000 mg | Freq: Once | INTRAMUSCULAR | Status: AC
  Administered 2023-06-16: 10 mg

## 2023-06-16 MED ORDER — PENTAFLUOROPROP-TETRAFLUOROETH EX AERO
INHALATION_SPRAY | Freq: Once | CUTANEOUS | Status: AC
  Administered 2023-06-16: 30 via TOPICAL
  Filled 2023-06-16: qty 30

## 2023-06-16 MED ORDER — ROPIVACAINE HCL 2 MG/ML IJ SOLN
1.0000 mL | Freq: Once | INTRAMUSCULAR | Status: AC
  Administered 2023-06-16: 1 mL via EPIDURAL

## 2023-06-16 MED ORDER — ROPIVACAINE HCL 2 MG/ML IJ SOLN
INTRAMUSCULAR | Status: AC
  Filled 2023-06-16: qty 20

## 2023-06-16 MED ORDER — DEXAMETHASONE SODIUM PHOSPHATE 10 MG/ML IJ SOLN
INTRAMUSCULAR | Status: AC
  Filled 2023-06-16: qty 1

## 2023-06-16 MED ORDER — SODIUM CHLORIDE (PF) 0.9 % IJ SOLN
INTRAMUSCULAR | Status: AC
  Filled 2023-06-16: qty 10

## 2023-06-16 NOTE — Progress Notes (Signed)
Safety precautions to be maintained throughout the outpatient stay will include: orient to surroundings, keep bed in low position, maintain call bell within reach at all times, provide assistance with transfer out of bed and ambulation.  

## 2023-06-16 NOTE — Progress Notes (Signed)
PROVIDER NOTE: Interpretation of information contained herein should be left to medically-trained personnel. Specific patient instructions are provided elsewhere under "Patient Instructions" section of medical record. This document was created in part using STT-dictation technology, any transcriptional errors that may result from this process are unintentional.  Patient: Brenda Simmons Type: Established DOB: 08/25/1976 MRN: [id number] PCP: Sandrea Hughs, NP  Service: Procedure DOS: 06/16/2023 Setting: Ambulatory Location: Ambulatory outpatient facility Delivery: Face-to-face Provider: Oswaldo Done, MD Specialty: Interventional Pain Management Specialty designation: 09 Location: Outpatient facility Ref. Prov.: Sandrea Hughs, NP       Interventional Therapy   Procedure: Cervical Epidural Steroid injection (CESI) (Interlaminar) #2  Laterality: Right  Level: C7-T1 Imaging: Fluoroscopy-assisted DOS: 06/16/2023  Performed by: Delano Metz, MD Anesthesia: Local anesthesia (1-2% Lidocaine) Anxiolysis: IV Versed 2.0 mg Sedation: Moderate Sedation Fentanyl 1 mL (50 mcg)   Purpose: Diagnostic/Therapeutic Indications: Cervicalgia, cervical radicular pain, degenerative disc disease, severe enough to impact quality of life or function. 1. Cervical radiculitis   2. Cervical foraminal stenosis  (Right: C3-4) (Bilateral: C4-5, C5-6) (Left: C6-7)   3. Chronic neck pain (2ry area of Pain) (Bilateral) (R>L)   4. Chronic shoulder pain (Right)   5. Chronic upper extremity pain (Right)   6. DDD (degenerative disc disease), cervical (C5-C7)   7. Abnormal CT scan, cervical spine (01/24/2022)    NAS-11 score:   Pre-procedure: 8 /10   Post-procedure: 0-No pain/10      Position  Prep  Materials:  Location setting: Procedure suite Position: Prone, on modified reverse trendelenburg to facilitate breathing, with head in head-cradle. Pillows positioned under chest (below chin-level) with  cervical spine flexed. Safety Precautions: Patient was assessed for positional comfort and pressure points before starting the procedure. Prepping solution: DuraPrep (Iodine Povacrylex [0.7% available iodine] and Isopropyl Alcohol, 74% w/w) Prep Area: Entire  cervicothoracic region Approach: percutaneous, paramedial Intended target: Posterior cervical epidural space Materials Procedure:  Tray: Epidural Needle(s): Epidural (Tuohy) Qty: 1 Length: (90mm) 3.5-inch Gauge: 17G   H&P (Pre-op Assessment):  Ms. Fanfan is a 47 y.o. (year old), female patient, seen today for interventional treatment. She  has a past surgical history that includes Other surgical history (Left, ankle surgery); Salpingectomy (Bilateral); Ankle surgery (Left); Breast cyst aspiration (Right, age 58); and Tubal ligation (Bilateral). Ms. Quist has a current medication list which includes the following prescription(s): albuterol, aspirin-salicylamide-caffeine, atorvastatin, cetirizine, cyclobenzaprine, escitalopram, fluticasone, rightest gs550 blood glucose, ibuprofen, lisinopril, meclizine, omeprazole, ondansetron, propranolol er, rightest gl300 lancets, sitagliptin-metformin, sumatriptan, trazodone, vitamin d (ergocalciferol), gabapentin, and metoprolol tartrate, and the following Facility-Administered Medications: fentanyl and ivabradine. Her primarily concern today is the Neck Pain  Initial Vital Signs:  Pulse/HCG Rate: 73ECG Heart Rate: 80 (NSR) Temp: (!) 97.3 F (36.3 C) Resp: 18 BP: 120/83 SpO2: 98 %  BMI: Estimated body mass index is 37.76 kg/m as calculated from the following:   Height as of this encounter: 5\' 4"  (1.626 m).   Weight as of this encounter: 220 lb (99.8 kg).  Risk Assessment: Allergies: Reviewed. She has No Known Allergies.  Allergy Precautions: None required Coagulopathies: Reviewed. None identified.  Blood-thinner therapy: None at this time Active Infection(s): Reviewed. None identified.  Ms. Subia is afebrile  Site Confirmation: Ms. Erle was asked to confirm the procedure and laterality before marking the site Procedure checklist: Completed Consent: Before the procedure and under the influence of no sedative(s), amnesic(s), or anxiolytics, the patient was informed of the treatment options, risks and possible complications. To fulfill our ethical and legal  obligations, as recommended by the American Medical Association's Code of Ethics, I have informed the patient of my clinical impression; the nature and purpose of the treatment or procedure; the risks, benefits, and possible complications of the intervention; the alternatives, including doing nothing; the risk(s) and benefit(s) of the alternative treatment(s) or procedure(s); and the risk(s) and benefit(s) of doing nothing. The patient was provided information about the general risks and possible complications associated with the procedure. These may include, but are not limited to: failure to achieve desired goals, infection, bleeding, organ or nerve damage, allergic reactions, paralysis, and death. In addition, the patient was informed of those risks and complications associated to Spine-related procedures, such as failure to decrease pain; infection (i.e.: Meningitis, epidural or intraspinal abscess); bleeding (i.e.: epidural hematoma, subarachnoid hemorrhage, or any other type of intraspinal or peri-dural bleeding); organ or nerve damage (i.e.: Any type of peripheral nerve, nerve root, or spinal cord injury) with subsequent damage to sensory, motor, and/or autonomic systems, resulting in permanent pain, numbness, and/or weakness of one or several areas of the body; allergic reactions; (i.e.: anaphylactic reaction); and/or death. Furthermore, the patient was informed of those risks and complications associated with the medications. These include, but are not limited to: allergic reactions (i.e.: anaphylactic or anaphylactoid  reaction(s)); adrenal axis suppression; blood sugar elevation that in diabetics may result in ketoacidosis or comma; water retention that in patients with history of congestive heart failure may result in shortness of breath, pulmonary edema, and decompensation with resultant heart failure; weight gain; swelling or edema; medication-induced neural toxicity; particulate matter embolism and blood vessel occlusion with resultant organ, and/or nervous system infarction; and/or aseptic necrosis of one or more joints. Finally, the patient was informed that Medicine is not an exact science; therefore, there is also the possibility of unforeseen or unpredictable risks and/or possible complications that may result in a catastrophic outcome. The patient indicated having understood very clearly. We have given the patient no guarantees and we have made no promises. Enough time was given to the patient to ask questions, all of which were answered to the patient's satisfaction. Ms. Everroad has indicated that she wanted to continue with the procedure. Attestation: I, the ordering provider, attest that I have discussed with the patient the benefits, risks, side-effects, alternatives, likelihood of achieving goals, and potential problems during recovery for the procedure that I have provided informed consent. Date  Time: 06/16/2023  9:20 AM   Pre-Procedure Preparation:  Monitoring: As per clinic protocol. Respiration, ETCO2, SpO2, BP, heart rate and rhythm monitor placed and checked for adequate function Safety Precautions: Patient was assessed for positional comfort and pressure points before starting the procedure. Time-out: I initiated and conducted the "Time-out" before starting the procedure, as per protocol. The patient was asked to participate by confirming the accuracy of the "Time Out" information. Verification of the correct person, site, and procedure were performed and confirmed by me, the nursing staff, and the  patient. "Time-out" conducted as per Joint Commission's Universal Protocol (UP.01.01.01). Time: 1108 Start Time: 1108 hrs.  Description  Narrative of Procedure:          Rationale (medical necessity): procedure needed and proper for the diagnosis and/or treatment of the patient's medical symptoms and needs. Start Time: 1108 hrs. Safety Precautions: Aspiration looking for blood return was conducted prior to all injections. At no point did we inject any substances, as a needle was being advanced. No attempts were made at seeking any paresthesias. Safe injection practices and needle  disposal techniques used. Medications properly checked for expiration dates. SDV (single dose vial) medications used. Description of procedure: Protocol guidelines were followed. The patient was assisted into a comfortable position. The target area was identified and the area prepped in the usual manner. Skin & deeper tissues infiltrated with local anesthetic. Appropriate amount of time allowed to pass for local anesthetics to take effect. Using fluoroscopic guidance, the epidural needle was introduced through the skin, ipsilateral to the reported pain, and advanced to the target area. Posterior laminar os was contacted and the needle walked caudad, until the lamina was cleared. The ligamentum flavum was engaged and the epidural space identified using "loss-of-resistance technique" with 2-3 ml of PF-NaCl (0.9% NSS), in a 5cc dedicated LOR syringe. (See "Imaging guidance" below for use of contrast details.) Once proper needle placement was secured, and negative aspiration confirmed, the solution was injected in intermittent fashion, asking for systemic symptoms every 0.5cc. The needles were then removed and the area cleansed, making sure to leave some of the prepping solution back to take advantage of its long term bactericidal properties.  Vitals:   06/16/23 1107 06/16/23 1112 06/16/23 1120 06/16/23 1127  BP: 122/88 118/80  117/84 105/81  Pulse:    73  Resp: 16 17 18 16   Temp:      SpO2: 100% 100% 100% 100%  Weight:      Height:         End Time: 1118 hrs.  Imaging Guidance (Spinal):          Type of Imaging Technique: Fluoroscopy Guidance (Spinal) Indication(s): Assistance in needle guidance and placement for procedures requiring needle placement in or near specific anatomical locations not easily accessible without such assistance. Exposure Time: Please see nurses notes. Contrast: Before injecting any contrast, we confirmed that the patient did not have an allergy to iodine, shellfish, or radiological contrast. Once satisfactory needle placement was completed at the desired level, radiological contrast was injected. Contrast injected under live fluoroscopy. No contrast complications. See chart for type and volume of contrast used. Fluoroscopic Guidance: I was personally present during the use of fluoroscopy. "Tunnel Vision Technique" used to obtain the best possible view of the target area. Parallax error corrected before commencing the procedure. "Direction-depth-direction" technique used to introduce the needle under continuous pulsed fluoroscopy. Once target was reached, antero-posterior, oblique, and lateral fluoroscopic projection used confirm needle placement in all planes. Images permanently stored in EMR. Interpretation: I personally interpreted the imaging intraoperatively. Adequate needle placement confirmed in multiple planes. Appropriate spread of contrast into desired area was observed. No evidence of afferent or efferent intravascular uptake. No intrathecal or subarachnoid spread observed. Permanent images saved into the patient's record.  Post-operative Assessment:  Post-procedure Vital Signs:  Pulse/HCG Rate: 7372 (NSR) Temp: (!) 97.3 F (36.3 C) Resp: 16 BP: 105/81 SpO2: 100 %  EBL: None  Complications: No immediate post-treatment complications observed by team, or reported by  patient.  Note: The patient tolerated the entire procedure well. A repeat set of vitals were taken after the procedure and the patient was kept under observation following institutional policy, for this type of procedure. Post-procedural neurological assessment was performed, showing return to baseline, prior to discharge. The patient was provided with post-procedure discharge instructions, including a section on how to identify potential problems. Should any problems arise concerning this procedure, the patient was given instructions to immediately contact us, at any time, without hesitation. In any case, we plan to contact the patient by telephone for a  follow-up status report regarding this interventional procedure.  Comments:  No additional relevant information.  Plan of Care (POC)  Orders:  Orders Placed This Encounter  Procedures   Cervical Epidural Injection    Indication(s): Radiculitis and cervicalgia associated with cervical degenerative disc disease. Position: Prone Imaging guidance: Fluoroscopy required. Contrast required unless contraindicated by allergy or severe CKD. Equipment & Materials: Epidural tray & needle.    Scheduling Instructions:     Procedure: Cervical Epidural Steroid Injection/Block     Planned Level(s): C7-T1     Laterality: Right-sided     Anxiolysis: Patient's choice.     Timeframe: Today    Order Specific Question:   Where will this procedure be performed?    Answer:   [facility]    Comments:   by Dr. Conception [facility] C-[facility] 1-60 MIN NO REPORT    Intraoperative interpretation by procedural physician at [facility].    Standing Status:   Standing    Number of Occurrences:   1    Order Specific Question:   Reason for exam:    Answer:   Assistance in needle guidance and placement for procedures requiring needle placement in or near specific anatomical locations not easily accessible without such assistance.   Informed Consent  Details: Physician/Practitioner Attestation; Transcribe to consent form and obtain patient signature    Nursing instructions: Transcribe to consent form and obtain patient signature. Always confirm laterality of pain with Ms. Laba, before procedure.    Order Specific Question:   Physician/Practitioner attestation of informed consent for procedure/surgical case    Answer:   I, the physician/practitioner, attest that I have discussed with the patient the benefits, risks, side effects, alternatives, likelihood of achieving goals and potential problems during recovery for the procedure that I have provided informed consent.    Order Specific Question:   Procedure    Answer:   Cervical Epidural Steroid Injection (CESI) under fluoroscopic guidance    Order Specific Question:   Physician/Practitioner performing the procedure    Answer:   Maryon Kemnitz A. Laban Emperor MD    Order Specific Question:   Indication/Reason    Answer:   Indications: Cervicalgia (neck pain), cervical radicular pain, radiculitis (arm/shoulder pain, numbness, and/or weakness), degenerative disc disease, severe enough to greatly impact quality of life or function.   Provide equipment / supplies at bedside    Procedural tray: Epidural Tray (Disposable  single use) Skin infiltration needle: Regular 1.5-in, 25-G, (x1) Block needle size: Regular standard Catheter: No catheter required    Standing Status:   Standing    Number of Occurrences:   1    Order Specific Question:   Specify    Answer:   Epidural Tray   Chronic Opioid Analgesic:  None MME/day: 0 mg/day   Medications ordered for procedure: Meds ordered this encounter  Medications   iohexol (OMNIPAQUE) 180 MG/ML injection 10 mL    Must be Myelogram-compatible. If not available, you may substitute with a water-soluble, non-ionic, hypoallergenic, myelogram-compatible radiological contrast medium.   lidocaine (XYLOCAINE) 2 % (with pres) injection 400 mg    pentafluoroprop-tetrafluoroeth (GEBAUERS) aerosol   lactated ringers infusion   midazolam (VERSED) 5 MG/5ML injection 0.5-2 mg    Make sure Flumazenil is available in the pyxis when using this medication. If oversedation occurs, administer 0.2 mg IV over 15 sec. If after 45 sec no response, administer 0.2 mg again over 1 min; may repeat at 1 min intervals; not to exceed  4 doses (1 mg)   fentaNYL (SUBLIMAZE) injection 25-50 mcg    Make sure Narcan is available in the pyxis when using this medication. In the event of respiratory depression (RR< 8/min): Titrate NARCAN (naloxone) in increments of 0.1 to 0.2 mg IV at 2-3 minute intervals, until desired degree of reversal.   sodium chloride flush (NS) 0.9 % injection 1 mL   ropivacaine (PF) 2 mg/mL (0.2%) (NAROPIN) injection 1 mL   dexamethasone (DECADRON) injection 10 mg   Medications administered: We administered iohexol, lidocaine, pentafluoroprop-tetrafluoroeth, lactated ringers, midazolam, sodium chloride flush, ropivacaine (PF) 2 mg/mL (0.2%), and dexamethasone.  See the medical record for exact dosing, route, and time of administration.  Follow-up plan:   Return in about 2 weeks (around 06/30/2023) for (Face2F), (PPE).       Interventional Therapies  Risk Factors  Considerations:  MO  T2NIDDM  HTN  Anxiety   Planned  Pending:   Therapeutic right cervical ESI #2    Under consideration:   Diagnostic bilateral (L4-5, L5-S1) lumbar facet (L3-S1) MBB #2  Possible bilateral lumbar facet RFA #1  Diagnostic bilateral SI joint Blk #1  Diagnostic bilateral IA hip joint injection #1  Diagnostic bilateral IA knee joint injection #1  Diagnostic bilateral cervical facet MBB #1    Completed:   Diagnostic/therapeutic right cervical ESI x1 (05/17/2023) (100/100/70/0)  Diagnostic bilateral sacroiliac joint Blk x1 (80/0/0/0)  Diagnostic bilateral (L4-5, L5-S1) lumbar facet (L3-S1) MBB x1 (100/80/0/0)    Completed by other providers:   None  at this time   Therapeutic  Palliative (PRN) options:   None established            Recent Visits Date Type Provider Dept  06/02/23 Office Visit Delano Metz, MD [facility]  05/17/23 Procedure visit Delano Metz, MD [facility]  04/26/23 Procedure visit Delano Metz, MD [facility]  04/11/23 Office Visit Delano Metz, MD [facility]  03/29/23 Office Visit Delano Metz, MD [facility]  Showing recent visits within past 90 days and meeting all other requirements Today's Visits Date Type Provider Dept  06/16/23 Procedure visit Delano Metz, MD [facility]  Showing today's visits and meeting all other requirements Future Appointments Date Type Provider Dept  07/05/23 Appointment Delano Metz, MD [facility]  Showing future appointments within next 90 days and meeting all other requirements  Disposition: Discharge home  Discharge (Date  Time): 06/16/2023;   hrs.   Primary Care Physician: Sandrea Hughs, NP Location: [facility] Note by: Oswaldo Done, MD (TTS technology used. I apologize for any typographical errors that were not detected and corrected.) Date: 06/16/2023; Time: 12:22 PM  Disclaimer:  Medicine is not an Visual merchandiser. The only guarantee in medicine is that nothing is guaranteed. It is important to note that the decision to proceed with this intervention was based on the information collected from the patient. The Data and conclusions were drawn from the patient's questionnaire, the interview, and the physical examination. Because the information was provided in large part by the patient, it cannot be guaranteed that it has not been purposely or unconsciously manipulated. Every effort has been made to obtain as much relevant data as possible for this evaluation. It is important to note that the conclusions that lead to  this procedure are derived in large part from the available data. Always take into account that the treatment will also be dependent on availability of resources and existing treatment  guidelines, considered by other Pain Management Practitioners as being common knowledge and practice, at the time of the intervention. For Medico-Legal purposes, it is also important to point out that variation in procedural techniques and pharmacological choices are the acceptable norm. The indications, contraindications, technique, and results of the above procedure should only be interpreted and judged by a Board-Certified Interventional Pain Specialist with extensive familiarity and expertise in the same exact procedure and technique.

## 2023-06-16 NOTE — Patient Instructions (Signed)

## 2023-06-17 ENCOUNTER — Telehealth: Payer: Self-pay

## 2023-06-17 NOTE — Telephone Encounter (Signed)
Post procedure follow up.  Patient states she is doing ok.

## 2023-06-20 ENCOUNTER — Ambulatory Visit: Payer: MEDICAID | Attending: Cardiology | Admitting: Cardiology

## 2023-06-20 ENCOUNTER — Encounter: Payer: Self-pay | Admitting: Cardiology

## 2023-06-20 ENCOUNTER — Other Ambulatory Visit: Payer: Self-pay | Admitting: Student

## 2023-06-20 VITALS — BP 124/76 | HR 68 | Ht 64.0 in | Wt 224.4 lb

## 2023-06-20 DIAGNOSIS — R072 Precordial pain: Secondary | ICD-10-CM

## 2023-06-20 DIAGNOSIS — R002 Palpitations: Secondary | ICD-10-CM | POA: Diagnosis not present

## 2023-06-20 DIAGNOSIS — F17200 Nicotine dependence, unspecified, uncomplicated: Secondary | ICD-10-CM | POA: Diagnosis not present

## 2023-06-20 DIAGNOSIS — I10 Essential (primary) hypertension: Secondary | ICD-10-CM | POA: Diagnosis not present

## 2023-06-20 DIAGNOSIS — R42 Dizziness and giddiness: Secondary | ICD-10-CM

## 2023-06-20 NOTE — Progress Notes (Signed)
Cardiology Office Note:    Date:  06/20/2023   ID:  Brenda Simmons, DOB Nov 06, 1975, MRN [id number]  PCP:  Sandrea Hughs, NP   [facility] Providers Cardiologist:  Debbe Odea, MD     Referring MD: Sandrea Hughs, NP   Chief Complaint  Patient presents with   Follow-up    Discuss cardiac testing results.  Patient denies new or acute cardiac problems/concerns today.      History of Present Illness:    Brenda Simmons is a 47 y.o. female with a hx of hypertension, diabetes, anxiety, current smoker x 25+ years who presents with for follow-up.  Previously seen due to chest pain and palpitations.  Echo and coronary CT obtained to evaluate cardiac etiology for symptoms.  Cardiac monitor also placed to evaluate any significant arrhythmias.  She states overall chest pain has improved.  She still smokes, is working on quitting.  Presents for testing results.  No new concerns at this time.   Past Medical History:  Diagnosis Date   Anxiety    Breast mass x 3 mos   felt by MD, pt does not feel it   Bulging lumbar disc    Seen [facility] Clinic at [facility]   Chronic bronchitis [facility])    Depression    Seeing Mental Health Professional   Diabetes mellitus without complication (HCC)    Hypertension    Migraines    Partial tear of rotator cuff    Injections did not help   Pre-diabetes    On Metformin.    Past Surgical History:  Procedure Laterality Date   ANKLE SURGERY Left    BREAST CYST ASPIRATION Right age 68   OTHER SURGICAL HISTORY Left ankle surgery   SALPINGECTOMY Bilateral    TUBAL LIGATION Bilateral     Current Medications: Current Meds  Medication Sig   albuterol (PROAIR HFA) 108 (90 Base) MCG/ACT inhaler INHALE 2 PUFFS INTO THE LUNGS EVERY FOUR TO SIX HOURS AS NEEDED FOR WHEEZING.   Aspirin-Salicylamide-Caffeine (BC HEADACHE POWDER PO) Take 1 packet by mouth as needed.   atorvastatin (LIPITOR) 20 MG tablet Take 20 mg by mouth daily.   cetirizine (ZYRTEC)  10 MG tablet Take 10 mg by mouth daily.   cyclobenzaprine (FLEXERIL) 10 MG tablet Take 10 mg by mouth 3 (three) times daily.   DEPO-PROVERA 150 MG/ML injection Inject 150 mg into the muscle every 3 (three) months.   escitalopram (LEXAPRO) 20 MG tablet Take 20 mg by mouth daily.   fluticasone (FLONASE) 50 MCG/ACT nasal spray Place 2 sprays into both nostrils once daily for allergies.   gabapentin (NEURONTIN) 300 MG capsule Take 1 capsule (300 mg total) by mouth 2 (two) times daily for 30 days. (Patient taking differently: Take 300 mg by mouth daily.)   glucose blood (RIGHTEST GS550 BLOOD GLUCOSE) test strip Use as directed   ibuprofen (ADVIL,MOTRIN) 600 MG tablet Take 600 mg by mouth every 6 (six) hours as needed for cramping.   lisinopril (ZESTRIL) 10 MG tablet Take 10 mg by mouth daily.   meclizine (ANTIVERT) 25 MG tablet Take by mouth.   omeprazole (PRILOSEC) 20 MG capsule TAKE 1 CAPSULE BY MOUTH ONCE A DAY FOR REFLUX AND COUGH.   ondansetron (ZOFRAN-ODT) 4 MG disintegrating tablet DISSOLVE 2 TABLETS (8 MG) BY MOUTH ONCE EVERY 8 HOURS AS NEEDED FOR NAUSEA   propranolol ER (INDERAL LA) 120 MG 24 hr capsule TAKE 1 CAPSULE BY MOUTH EVERY DAY AT BEDTIME.   QUEtiapine (SEROQUEL)  25 MG tablet Take 25 mg by mouth at bedtime.   Rightest GL300 Lancets MISC USE AS DIRECTED   sitaGLIPtin-metformin (JANUMET) 50-500 MG tablet Take 1 tablet by mouth 2 (two) times daily with a meal.   SUMAtriptan (IMITREX) 50 MG tablet SMARTSIG:1 Tablet(s) By Mouth   traZODone (DESYREL) 50 MG tablet Take 25-50 mg by mouth at bedtime as needed.   Vitamin D, Ergocalciferol, (DRISDOL) 1.25 MG (50000 UNIT) CAPS capsule Take 50,000 Units by mouth once a week.   Current Facility-Administered Medications for the 06/20/23 encounter (Office Visit) with Debbe Odea, MD  Medication   ivabradine (CORLANOR) tablet 15 mg     Allergies:   Patient has no known allergies.   Social History   Socioeconomic History   Marital  status: Single    Spouse name: Not on file   Number of children: Not on file   Years of education: Not on file   Highest education level: Not on file  Occupational History   Not on file  Tobacco Use   Smoking status: Every Day    Current packs/day: 0.25    Average packs/day: 0.3 packs/day for 15.0 years (3.8 ttl pk-yrs)    Types: Cigarettes   Smokeless tobacco: Never   Tobacco comments:    cutting back  Vaping Use   Vaping status: Never Used  Substance and Sexual Activity   Alcohol use: Yes    Comment: Occasionally during Holidays   Drug use: No   Sexual activity: Yes    Birth control/protection: None  Other Topics Concern   Not on file  Social History Narrative   Not on file   Social Determinants of Health   Financial Resource Strain: Not on file  Food Insecurity: Not on file  Transportation Needs: Not on file  Physical Activity: Insufficiently Active (04/12/2019)   Exercise Vital Sign    Days of Exercise per Week: 4 days    Minutes of Exercise per Session: 10 min  Stress: Not on file  Social Connections: Not on file     Family History: The patient's family history includes Cancer in her maternal grandmother and paternal grandmother; Depression in her brother and mother; Diabetes in her mother; Heart failure in her mother; Hypertension in her father, mother, and sister. There is no history of Breast cancer.  ROS:   Please see the history of present illness.     All other systems reviewed and are negative.  EKGs/Labs/Other Studies Reviewed:    The following studies were reviewed today:     EKG not obtained  Recent Labs: 10/18/2022: Magnesium 1.7 03/16/2023: BUN 10; Potassium 4.1; Sodium 135 06/09/2023: Creatinine, Ser 0.70  Recent Lipid Panel    Component Value Date/Time   CHOL 166 07/30/2015 1221   TRIG 103 07/30/2015 1221   HDL 43 07/30/2015 1221   CHOLHDL 3.9 07/30/2015 1221   LDLCALC 102 (H) 07/30/2015 1221     Risk Assessment/Calculations:                Physical Exam:    VS:  BP 124/76 (BP Location: Left Arm, Patient Position: Sitting, Cuff Size: Large)   Pulse 68   Ht 5\' 4"  (1.626 m)   Wt 224 lb 6.4 oz (101.8 kg)   SpO2 99%   BMI 38.52 kg/m     Wt Readings from Last 3 Encounters:  06/20/23 224 lb 6.4 oz (101.8 kg)  06/16/23 220 lb (99.8 kg)  06/02/23 220 lb (99.8 kg)  GEN:  Well nourished, well developed in no acute distress HEENT: Normal NECK: No JVD; No carotid bruits CARDIAC: RRR, no murmurs, rubs, gallops RESPIRATORY:  Clear to auscultation without rales, wheezing or rhonchi  ABDOMEN: Soft, non-tender, non-distended MUSCULOSKELETAL:  No edema; No deformity  SKIN: Warm and dry NEUROLOGIC:  Alert and oriented x 3 PSYCHIATRIC:  Normal affect   ASSESSMENT:    1. Precordial pain   2. Palpitations   3. Primary hypertension   4. Smoking     PLAN:    In order of problems listed above:  Chest pain, coronary CT no CAD, echo with EF 55 to 60%. Palpitations, cardiac monitor with no significant arrhythmias.  Patient made aware, reassured.  Plans to follow-up with neurology regarding dizziness.  Patient may have nicotine induced nystagmus.  Smoking cessation again strongly advised. Hypertension, BP  controlled.  Continue lisinopril 10 mg daily. Current smoker, smoking cessation advised.  Follow-up as needed.      Medication Adjustments/Labs and Tests Ordered: Current medicines are reviewed at length with the patient today.  Concerns regarding medicines are outlined above.  No orders of the defined types were placed in this encounter.  No orders of the defined types were placed in this encounter.   Patient Instructions  Medication Instructions:   Your physician recommends that you continue on your current medications as directed. Please refer to the Current Medication list given to you today.  *If you need a refill on your cardiac medications before your next appointment, please call your  pharmacy*   Lab Work:  None Ordered  If you have labs (blood work) drawn today and your tests are completely normal, you will receive your results only by: MyChart Message (if you have MyChart) OR A paper copy in the mail If you have any lab test that is abnormal or we need to change your treatment, we will call you to review the results.   Testing/Procedures:  None Ordered   Follow-Up: At [facility], you and your health needs are our priority.  As part of our continuing mission to provide you with exceptional heart care, we have created designated Provider Care Teams.  These Care Teams include your primary Cardiologist (physician) and Advanced Practice Providers (APPs -  Physician Assistants and Nurse Practitioners) who all work together to provide you with the care you need, when you need it.  We recommend signing up for the patient portal called "MyChart".  Sign up information is provided on this After Visit Summary.  MyChart is used to connect with patients for Virtual Visits (Telemedicine).  Patients are able to view lab/test results, encounter notes, upcoming appointments, etc.  Non-urgent messages can be sent to your provider as well.   To learn more about what you can do with MyChart, go to [email or web address].    Your next appointment:    AS NEEDED    Signed, Debbe Odea, MD  06/20/2023 12:16 PM    [facility]

## 2023-06-20 NOTE — Patient Instructions (Signed)
Medication Instructions:   Your physician recommends that you continue on your current medications as directed. Please refer to the Current Medication list given to you today.  *If you need a refill on your cardiac medications before your next appointment, please call your pharmacy*   Lab Work:  None Ordered  If you have labs (blood work) drawn today and your tests are completely normal, you will receive your results only by: MyChart Message (if you have MyChart) OR A paper copy in the mail If you have any lab test that is abnormal or we need to change your treatment, we will call you to review the results.   Testing/Procedures:  None Ordered   Follow-Up: At [facility], you and your health needs are our priority.  As part of our continuing mission to provide you with exceptional heart care, we have created designated Provider Care Teams.  These Care Teams include your primary Cardiologist (physician) and Advanced Practice Providers (APPs -  Physician Assistants and Nurse Practitioners) who all work together to provide you with the care you need, when you need it.  We recommend signing up for the patient portal called "MyChart".  Sign up information is provided on this After Visit Summary.  MyChart is used to connect with patients for Virtual Visits (Telemedicine).  Patients are able to view lab/test results, encounter notes, upcoming appointments, etc.  Non-urgent messages can be sent to your provider as well.   To learn more about what you can do with MyChart, go to [email or web address].    Your next appointment:  AS NEEDED

## 2023-06-28 ENCOUNTER — Ambulatory Visit: Payer: MEDICAID | Attending: Orthopedic Surgery

## 2023-06-28 DIAGNOSIS — M5416 Radiculopathy, lumbar region: Secondary | ICD-10-CM | POA: Insufficient documentation

## 2023-06-28 DIAGNOSIS — M47812 Spondylosis without myelopathy or radiculopathy, cervical region: Secondary | ICD-10-CM | POA: Diagnosis not present

## 2023-06-28 DIAGNOSIS — M4316 Spondylolisthesis, lumbar region: Secondary | ICD-10-CM | POA: Diagnosis not present

## 2023-06-28 DIAGNOSIS — M542 Cervicalgia: Secondary | ICD-10-CM | POA: Insufficient documentation

## 2023-06-28 DIAGNOSIS — M47816 Spondylosis without myelopathy or radiculopathy, lumbar region: Secondary | ICD-10-CM | POA: Insufficient documentation

## 2023-06-28 DIAGNOSIS — M5030 Other cervical disc degeneration, unspecified cervical region: Secondary | ICD-10-CM | POA: Insufficient documentation

## 2023-06-28 DIAGNOSIS — M25511 Pain in right shoulder: Secondary | ICD-10-CM | POA: Insufficient documentation

## 2023-06-28 DIAGNOSIS — M5459 Other low back pain: Secondary | ICD-10-CM | POA: Diagnosis present

## 2023-06-28 DIAGNOSIS — M5412 Radiculopathy, cervical region: Secondary | ICD-10-CM | POA: Diagnosis not present
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
## 2023-06-29 ENCOUNTER — Ambulatory Visit: Payer: MEDICAID

## 2023-07-04 NOTE — Progress Notes (Unsigned)
PROVIDER NOTE: Information contained herein reflects review and annotations entered in association with encounter. Interpretation of such information and data should be left to medically-trained personnel. Information provided to patient can be located elsewhere in the medical record under "Patient Instructions". Document created using STT-dictation technology, any transcriptional errors that may result from process are unintentional.    Patient: Brenda Simmons  Service Category: E/M  Provider: Oswaldo Done, MD  DOB: [birth year]  DOS: 07/05/2023  Referring Provider: Sandrea Hughs, NP  MRN: [id number]  Specialty: Interventional Pain Management  PCP: Sandrea Hughs, NP  Type: Established Patient  Setting: Ambulatory outpatient    Location: Office  Delivery: Face-to-face     HPI  Brenda Simmons, a 47 y.o. year old female, is here today because of her No primary diagnosis found.. Brenda Simmons's primary complain today is No chief complaint on file.  Pertinent problems: Brenda Simmons has Disorder of rotator cuff; Chronic neck pain (2ry area of Pain) (Bilateral) (R>L); Chronic shoulder pain (Right); Migraine headache without aura; Migrainous dizziness; Right-sided low back pain with right-sided sciatica; Bilateral lower extremity pain; Chronic tension-type headache, not intractable; Herniated cervical disc; Osteoarthritis of spine with radiculopathy, cervical region; Right rotator cuff tear; Shoulder tendinitis, right; Abnormal CT scan, cervical spine (01/24/2022); DDD (degenerative disc disease), cervical (C5-C7); Chronic pain syndrome; Chronic low back pain (1ry area of Pain) (Bilateral) (R>L) w/o sciatica; Chronic hip pain (3ry area of Pain) (Bilateral) (R>L); Chronic knee pain (4th area of Pain) (Bilateral) (R>L); Chronic lower extremity pain (5th area of Pain) (Bilateral) (L>R); History of ankle surgery old (2006) (Left); Numbness of lower extremity (Intermittent) (Bilateral); DDD (degenerative  disc disease), lumbosacral; Osteoarthritis of knee (Right); Osteoarthritis of hips (Bilateral); Osteoarthritis of sacroiliac joints (Bilateral) (HCC); Cervical facet hypertrophy (Multilevel) (Bilateral); Cervical foraminal stenosis  (Right: C3-4) (Bilateral: C4-5, C5-6) (Left: C6-7); Grade 1 (5 mm) anterolisthesis of lumbar spine (L4/L5); Lumbar facet joint arthropathy (L4-5); Lumbosacral facet joint syndrome; Spondylosis without myelopathy or radiculopathy, lumbosacral region; Lumbar facet joint pain; Chronic sacroiliac joint pain (Bilateral) (R>L); Disorder of sacroiliac joints (Bilateral); Somatic dysfunction of sacroiliac joints (Bilateral); Other spondylosis, sacral and sacrococcygeal region; Abnormal MRI, lumbar spine (05/07/2022); Abnormal MRI, cervical spine (05/07/2022); Chronic upper extremity pain (Left); Cervical radiculitis; Acute neck pain; and Chronic upper extremity pain (Right) on their pertinent problem list. Pain Assessment: Severity of   is reported as a  /10. Location:    / . Onset:  . Quality:  . Timing:  . Modifying factor(s):  Marland Kitchen Vitals:  vitals were not taken for this visit.  BMI: Estimated body mass index is 38.52 kg/m as calculated from the following:   Height as of 06/20/23: 5\' 4"  (1.626 m).   Weight as of 06/20/23: 224 lb 6.4 oz (101.8 kg). Last encounter: 06/02/2023. Last procedure: 06/16/2023.  Reason for encounter: post-procedure evaluation and assessment. ***  Post-procedure evaluation   Procedure: Cervical Epidural Steroid injection (CESI) (Interlaminar) #2  Laterality: Right  Level: C7-T1 Imaging: Fluoroscopy-assisted DOS: 06/16/2023  Performed by: Delano Metz, MD Anesthesia: Local anesthesia (1-2% Lidocaine) Anxiolysis: IV Versed 2.0 mg Sedation: Moderate Sedation Fentanyl 1 mL (50 mcg)   Purpose: Diagnostic/Therapeutic Indications: Cervicalgia, cervical radicular pain, degenerative disc disease, severe enough to impact quality of life or function. 1.  Cervical radiculitis   2. Cervical foraminal stenosis  (Right: C3-4) (Bilateral: C4-5, C5-6) (Left: C6-7)   3. Chronic neck pain (2ry area of Pain) (Bilateral) (R>L)   4. Chronic shoulder pain (Right)   5. Chronic  upper extremity pain (Right)   6. DDD (degenerative disc disease), cervical (C5-C7)   7. Abnormal CT scan, cervical spine (01/24/2022)    NAS-11 score:   Pre-procedure: 8 /10   Post-procedure: 0-No pain/10      Effectiveness:  Initial hour after procedure:   ***. Subsequent 4-6 hours post-procedure:   ***. Analgesia past initial 6 hours:   ***. Ongoing improvement:  Analgesic:  *** Function:    ***    ROM:    ***     Pharmacotherapy Assessment  Analgesic: None MME/day: 0 mg/day   Monitoring: [place] PMP: PDMP reviewed during this encounter.       Pharmacotherapy: No side-effects or adverse reactions reported. Compliance: No problems identified. Effectiveness: Clinically acceptable.  No notes on file  No results found for: "CBDTHCR" No results found for: "D8THCCBX" No results found for: "D9THCCBX"  UDS:  Summary  Date Value Ref Range Status  10/18/2022 Note  Final    Comment:    ==================================================================== Compliance Drug Analysis, Ur ==================================================================== Test                             Result       Flag       Units  Drug Present and Declared for Prescription Verification   Gabapentin                     PRESENT      EXPECTED   Salicylate                     PRESENT      EXPECTED   Propranolol                    PRESENT      EXPECTED  Drug Present not Declared for Prescription Verification   Naproxen                       PRESENT      UNEXPECTED  Drug Absent but Declared for Prescription Verification   Fluoxetine                     Not Detected UNEXPECTED   Quetiapine                     Not Detected UNEXPECTED   Ibuprofen                      Not Detected  UNEXPECTED    Ibuprofen, as indicated in the declared medication list, is not    always detected even when used as directed.  ==================================================================== Test                      Result    Flag   Units      Ref Range   Creatinine              139              mg/dL      >=84 ==================================================================== Declared Medications:  The flagging and interpretation on this report are based on the  following declared medications.  Unexpected results may arise from  inaccuracies in the declared medications.   **Note: The testing scope of this panel includes these medications:   Fluoxetine (Prozac)  Gabapentin (Neurontin)  Propranolol  Quetiapine (Seroquel)   **  Note: The testing scope of this panel does not include small to  moderate amounts of these reported medications:   Aspirin  Ibuprofen (Advil)  Salicylate (Salicylamide)   **Note: The testing scope of this panel does not include the  following reported medications:   Albuterol  Caffeine  Cetirizine (Zyrtec)  Fluticasone (Flonase)  Metformin (Janumet)  Montelukast  Multivitamin  Omeprazole  Ondansetron  Sitagliptin (Janumet) ==================================================================== For clinical consultation, please call [phone]. ====================================================================       ROS  Constitutional: Denies any fever or chills Gastrointestinal: No reported hemesis, hematochezia, vomiting, or acute GI distress Musculoskeletal: Denies any acute onset joint swelling, redness, loss of ROM, or weakness Neurological: No reported episodes of acute onset apraxia, aphasia, dysarthria, agnosia, amnesia, paralysis, loss of coordination, or loss of consciousness  Medication Review  Aspirin-Salicylamide-Caffeine, QUEtiapine, Rightest GL300 Lancets, SUMAtriptan, Vitamin D (Ergocalciferol), albuterol,  atorvastatin, cetirizine, cyclobenzaprine, escitalopram, fluticasone, gabapentin, glucose blood, ibuprofen, lisinopril, meclizine, medroxyPROGESTERone, omeprazole, ondansetron, propranolol ER, sitaGLIPtin-metformin, and traZODone  History Review  Allergy: Brenda Simmons has No Known Allergies. Drug: Brenda Simmons  reports no history of drug use. Alcohol:  reports current alcohol use. Tobacco:  reports that she has been smoking cigarettes. She has a 3.8 pack-year smoking history. She has never used smokeless tobacco. Social: Brenda Simmons  reports that she has been smoking cigarettes. She has a 3.8 pack-year smoking history. She has never used smokeless tobacco. She reports current alcohol use. She reports that she does not use drugs. Medical:  has a past medical history of Anxiety, Breast mass (x 3 mos), Bulging lumbar disc, Chronic bronchitis (HCC), Depression, Diabetes mellitus without complication (HCC), Hypertension, Migraines, Partial tear of rotator cuff, and Pre-diabetes. Surgical: Brenda Simmons  has a past surgical history that includes Other surgical history (Left, ankle surgery); Salpingectomy (Bilateral); Ankle surgery (Left); Breast cyst aspiration (Right, age 60); and Tubal ligation (Bilateral). Family: family history includes Cancer in her maternal grandmother and paternal grandmother; Depression in her brother and mother; Diabetes in her mother; Heart failure in her mother; Hypertension in her father, mother, and sister.  Laboratory Chemistry Profile   Renal Lab Results  Component Value Date   BUN 10 03/16/2023   CREATININE 0.70 06/09/2023   BCR 8 (L) 10/18/2022   GFRAA >60 07/28/2016   GFRNONAA >60 03/16/2023    Hepatic Lab Results  Component Value Date   AST 14 10/18/2022   ALT 11 (L) 07/28/2016   ALBUMIN 3.8 (L) 10/18/2022   ALKPHOS 114 10/18/2022   LIPASE 103 09/17/2014    Electrolytes Lab Results  Component Value Date   NA 135 03/16/2023   K 4.1 03/16/2023   CL 104  03/16/2023   CALCIUM 8.4 (L) 03/16/2023   MG 1.7 10/18/2022    Bone Lab Results  Component Value Date   VD25OH 23.0 (L) 12/04/2015   25OHVITD1 9.2 (L) 10/18/2022   25OHVITD2 <1.0 10/18/2022   25OHVITD3 9.1 10/18/2022    Inflammation (CRP: Acute Phase) (ESR: Chronic Phase) Lab Results  Component Value Date   CRP 14 (H) 10/18/2022   ESRSEDRATE 7 10/18/2022         Note: Above Lab results reviewed.  Recent Imaging Review  CT CORONARY MORPH W/CTA COR W/SCORE W/CA W/CM &/OR WO/CM Addendum: ADDENDUM REPORT: 06/25/2023 01:12   EXAM:  OVER-READ INTERPRETATION  CT CHEST   The following report is an over-read performed by radiologist Dr.  Alcide Clever of [facility] on 06/25/2023. This over-read  does not include interpretation of  cardiac or coronary anatomy or  pathology. The coronary calcium score/coronary CTA interpretation by  the cardiologist is attached.   COMPARISON:  None.   FINDINGS:  Cardiovascular: There are no significant extracardiac vascular  findings.   Mediastinum/Nodes: There are no enlarged lymph nodes within the  visualized mediastinum.   Lungs/Pleura: There is no pleural effusion. The visualized lungs  appear clear.   Upper abdomen: No significant findings in the visualized upper  abdomen.   Musculoskeletal/Chest wall: No chest wall mass or suspicious osseous  findings within the visualized chest.   IMPRESSION:  No significant extracardiac findings within the visualized chest.   Electronically Signed    By: Alcide Clever M.D.    On: 06/25/2023 01:12 Narrative: CLINICAL DATA:  Chest pain, shortness of breath  EXAM: Cardiac/Coronary  CTA  TECHNIQUE: The patient was scanned on a Siemens Somatom go.Top scanner.  : A retrospective scan was triggered in the ascending thoracic aorta. Axial non-contrast 3 mm slices were carried out through the heart. The data set was analyzed on a dedicated work station and scored using the Agatson  method. Gantry rotation speed was 330 msecs and collimation was .6 mm. 100mg  of metoprolol and 0.4 mg of sl NTG was given. The 3D data set was reconstructed in 5% intervals of the 60-95 % of the R-R cycle. Diastolic phases were analyzed on a dedicated work station using MPR, MIP and VRT modes. The patient received 100 cc of contrast.  FINDINGS: Aorta:  Normal size.  No calcifications.  No dissection.  Aortic Valve:  Trileaflet.  No calcifications.  Coronary Arteries:  Normal coronary origin.  Right dominance.  RCA is a dominant artery. There is no plaque.  Left main gives rise to LAD and LCX arteries. LM has no disease.  LAD has no plaque.  LCX is a non-dominant artery.  There is no plaque.  Other findings:  Normal pulmonary vein drainage into the left atrium.  Normal left atrial appendage without a thrombus.  Normal size of the pulmonary artery.  IMPRESSION: 1. Coronary calcium score of 0.  2. Normal coronary origin with right dominance.  3. No evidence of CAD.  4. CAD-RADS 0. Consider non-atherosclerotic causes of chest pain.  Electronically Signed: By: Debbe Odea M.D. On: 06/09/2023 17:03 Note: Reviewed        Physical Exam  General appearance: Well nourished, well developed, and well hydrated. In no apparent acute distress Mental status: Alert, oriented x 3 (person, place, & time)       Respiratory: No evidence of acute respiratory distress Eyes: PERLA Vitals: There were no vitals taken for this visit. BMI: Estimated body mass index is 38.52 kg/m as calculated from the following:   Height as of 06/20/23: 5\' 4"  (1.626 m).   Weight as of 06/20/23: 224 lb 6.4 oz (101.8 kg). Ideal: Patient weight not recorded  Assessment   Diagnosis Status  No diagnosis found. Controlled Controlled Controlled   Updated Problems: No problems updated.  Plan of Care  Problem-specific:  No problem-specific Assessment & Plan notes found for this encounter.  Ms.  KAWAILANI Simmons has a current medication list which includes the following long-term medication(s): albuterol, depo-provera, escitalopram, fluticasone, gabapentin, lisinopril, omeprazole, propranolol er, sitagliptin-metformin, and sumatriptan.  Pharmacotherapy (Medications Ordered): No orders of the defined types were placed in this encounter.  Orders:  No orders of the defined types were placed in this encounter.  Follow-up plan:   No follow-ups on file.  Interventional Therapies  Risk Factors  Considerations:  MO  T2NIDDM  HTN  Anxiety   Planned  Pending:   Therapeutic right cervical ESI #2    Under consideration:   Diagnostic bilateral (L4-5, L5-S1) lumbar facet (L3-S1) MBB #2  Possible bilateral lumbar facet RFA #1  Diagnostic bilateral SI joint Blk #1  Diagnostic bilateral IA hip joint injection #1  Diagnostic bilateral IA knee joint injection #1  Diagnostic bilateral cervical facet MBB #1    Completed:   Diagnostic/therapeutic right cervical ESI x1 (05/17/2023) (100/100/70/0)  Diagnostic bilateral sacroiliac joint Blk x1 (80/0/0/0)  Diagnostic bilateral (L4-5, L5-S1) lumbar facet (L3-S1) MBB x1 (100/80/0/0)    Completed by other providers:   None at this time   Therapeutic  Palliative (PRN) options:   None established             Recent Visits Date Type Provider Dept  06/16/23 Procedure visit Delano Metz, MD [facility]  06/02/23 Office Visit Delano Metz, MD [facility]  05/17/23 Procedure visit Delano Metz, MD [facility]  04/26/23 Procedure visit Delano Metz, MD [facility]  04/11/23 Office Visit Delano Metz, MD [facility]  Showing recent visits within past 90 days and meeting all other requirements Future Appointments Date Type Provider Dept  07/05/23 Appointment Delano Metz, MD [facility]  Showing future appointments within next 90 days  and meeting all other requirements  I discussed the assessment and treatment plan with the patient. The patient was provided an opportunity to ask questions and all were answered. The patient agreed with the plan and demonstrated an understanding of the instructions.  Patient advised to call back or seek an in-person evaluation if the symptoms or condition worsens.  Duration of encounter: *** minutes.  Total time on encounter, as per AMA guidelines included both the face-to-face and non-face-to-face time personally spent by the physician and/or other qualified health care professional(s) on the day of the encounter (includes time in activities that require the physician or other qualified health care professional and does not include time in activities normally performed by clinical staff). Physician's time may include the following activities when performed: Preparing to see the patient (e.g., pre-charting review of records, searching for previously ordered imaging, lab work, and nerve conduction tests) Review of prior analgesic pharmacotherapies. Reviewing PMP Interpreting ordered tests (e.g., lab work, imaging, nerve conduction tests) Performing post-procedure evaluations, including interpretation of diagnostic procedures Obtaining and/or reviewing separately obtained history Performing a medically appropriate examination and/or evaluation Counseling and educating the patient/family/caregiver Ordering medications, tests, or procedures Referring and communicating with other health care professionals (when not separately reported) Documenting clinical information in the electronic or other health record Independently interpreting results (not separately reported) and communicating results to the patient/ family/caregiver Care coordination (not separately reported)  Note by: Oswaldo Done, MD Date: 07/05/2023; Time: 12:41 PM

## 2023-07-05 ENCOUNTER — Encounter: Payer: Self-pay | Admitting: Pain Medicine

## 2023-07-05 ENCOUNTER — Ambulatory Visit: Payer: MEDICAID | Attending: Pain Medicine | Admitting: Pain Medicine

## 2023-07-05 VITALS — BP 116/84 | HR 74 | Temp 96.4°F | Resp 14 | Ht 64.0 in | Wt 220.0 lb

## 2023-07-05 DIAGNOSIS — M25511 Pain in right shoulder: Secondary | ICD-10-CM | POA: Insufficient documentation

## 2023-07-05 DIAGNOSIS — M79601 Pain in right arm: Secondary | ICD-10-CM | POA: Diagnosis not present

## 2023-07-05 DIAGNOSIS — M5412 Radiculopathy, cervical region: Secondary | ICD-10-CM | POA: Insufficient documentation

## 2023-07-05 DIAGNOSIS — G8929 Other chronic pain: Secondary | ICD-10-CM | POA: Diagnosis present

## 2023-07-05 DIAGNOSIS — M542 Cervicalgia: Secondary | ICD-10-CM | POA: Diagnosis present

## 2023-07-05 DIAGNOSIS — Z09 Encounter for follow-up examination after completed treatment for conditions other than malignant neoplasm: Secondary | ICD-10-CM | POA: Insufficient documentation

## 2023-07-07 ENCOUNTER — Telehealth: Payer: Self-pay

## 2023-07-07 ENCOUNTER — Ambulatory Visit: Payer: MEDICAID

## 2023-07-07 NOTE — Telephone Encounter (Signed)
No show. Called patient and left a message pertaining to appointment and a reminder for the next follow up session. Return phone call requested. Phone number ([phone]) provided.   

## 2023-07-12 ENCOUNTER — Ambulatory Visit: Payer: MEDICAID

## 2023-07-12 DIAGNOSIS — M5459 Other low back pain: Secondary | ICD-10-CM

## 2023-07-12 DIAGNOSIS — M25511 Pain in right shoulder: Secondary | ICD-10-CM

## 2023-07-12 DIAGNOSIS — M542 Cervicalgia: Secondary | ICD-10-CM
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
## 2023-07-14 ENCOUNTER — Ambulatory Visit: Payer: MEDICAID

## 2023-07-19 ENCOUNTER — Ambulatory Visit: Payer: MEDICAID

## 2023-07-19 DIAGNOSIS — M542 Cervicalgia: Secondary | ICD-10-CM | POA: Diagnosis not present
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
## 2023-07-26 ENCOUNTER — Ambulatory Visit: Payer: MEDICAID

## 2023-07-28 ENCOUNTER — Ambulatory Visit: Payer: MEDICAID

## 2023-07-28 DIAGNOSIS — M5459 Other low back pain: Secondary | ICD-10-CM

## 2023-07-28 DIAGNOSIS — M25511 Pain in right shoulder: Secondary | ICD-10-CM

## 2023-07-28 DIAGNOSIS — M542 Cervicalgia: Secondary | ICD-10-CM | POA: Diagnosis not present
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
## 2023-08-02 ENCOUNTER — Ambulatory Visit: Payer: MEDICAID

## 2023-08-02 ENCOUNTER — Encounter: Payer: Self-pay | Admitting: Orthopedic Surgery

## 2023-08-04 ENCOUNTER — Ambulatory Visit: Payer: MEDICAID

## 2023-08-05 ENCOUNTER — Ambulatory Visit
Admission: RE | Admit: 2023-08-05 | Discharge: 2023-08-05 | Disposition: A | Discharge status: Home / Self Care | Payer: MEDICAID | Source: Ambulatory Visit | Attending: Student

## 2023-08-05 ENCOUNTER — Ambulatory Visit
Admission: RE | Admit: 2023-08-05 | Discharge: 2023-08-05 | Disposition: A | Discharge status: Home / Self Care | Payer: MEDICAID | Source: Ambulatory Visit | Attending: Orthopedic Surgery | Admitting: Orthopedic Surgery

## 2023-08-05 DIAGNOSIS — M5412 Radiculopathy, cervical region: Secondary | ICD-10-CM

## 2023-08-05 DIAGNOSIS — M47812 Spondylosis without myelopathy or radiculopathy, cervical region: Secondary | ICD-10-CM

## 2023-08-05 DIAGNOSIS — M5416 Radiculopathy, lumbar region: Secondary | ICD-10-CM

## 2023-08-05 DIAGNOSIS — M5030 Other cervical disc degeneration, unspecified cervical region: Secondary | ICD-10-CM

## 2023-08-05 DIAGNOSIS — M4316 Spondylolisthesis, lumbar region: Secondary | ICD-10-CM

## 2023-08-05 DIAGNOSIS — M47816 Spondylosis without myelopathy or radiculopathy, lumbar region: Secondary | ICD-10-CM

## 2023-08-05 DIAGNOSIS — R42 Dizziness and giddiness: Secondary | ICD-10-CM

## 2023-08-05 MED ORDER — GADOPICLENOL 0.5 MMOL/ML IV SOLN
10.0000 mL | Freq: Once | INTRAVENOUS | Status: AC | PRN
  Administered 2023-08-05: 10 mL via INTRAVENOUS

## 2023-08-09 ENCOUNTER — Ambulatory Visit: Payer: MEDICAID | Attending: Orthopedic Surgery

## 2023-08-09 DIAGNOSIS — M25511 Pain in right shoulder: Secondary | ICD-10-CM | POA: Insufficient documentation

## 2023-08-09 DIAGNOSIS — M542 Cervicalgia: Secondary | ICD-10-CM | POA: Insufficient documentation

## 2023-08-09 DIAGNOSIS — M5459 Other low back pain: Secondary | ICD-10-CM | POA: Diagnosis present
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
## 2023-08-11 ENCOUNTER — Ambulatory Visit: Payer: MEDICAID | Admitting: Physical Therapy

## 2023-08-16 NOTE — Progress Notes (Unsigned)
PROVIDER NOTE: Information contained herein reflects review and annotations entered in association with encounter. Interpretation of such information and data should be left to medically-trained personnel. Information provided to patient can be located elsewhere in the medical record under "Patient Instructions". Document created using STT-dictation technology, any transcriptional errors that may result from process are unintentional.    Patient: Brenda Simmons  Service Category: E/M  Provider: Oswaldo Done, MD  DOB: 12-Oct-1975  DOS: 08/17/2023  Referring Provider: Sandrea Hughs, NP  MRN: [id number]  Specialty: Interventional Pain Management  PCP: Sandrea Hughs, NP  Type: Established Patient  Setting: Ambulatory outpatient    Location: Office  Delivery: Face-to-face     HPI  [name], a 47 y.o. year old female, is here today because of her Chronic bilateral low back pain without sciatica [M54.50, G89.29]. Brenda Simmons's primary complain today is No chief complaint on file.  Pertinent problems: Brenda Simmons has Disorder of rotator cuff; Chronic neck pain (2ry area of Pain) (Bilateral) (R>L); Chronic shoulder pain (Right); Migraine headache without aura; Migrainous dizziness; Right-sided low back pain with right-sided sciatica; Bilateral lower extremity pain; Chronic tension-type headache, not intractable; Herniated cervical disc; Osteoarthritis of spine with radiculopathy, cervical region; Right rotator cuff tear; Shoulder tendinitis, right; Abnormal CT scan, cervical spine (01/24/2022); DDD (degenerative disc disease), cervical (C5-C7); Chronic pain syndrome; Chronic low back pain (1ry area of Pain) (Bilateral) (R>L) w/o sciatica; Chronic hip pain (3ry area of Pain) (Bilateral) (R>L); Chronic knee pain (4th area of Pain) (Bilateral) (R>L); Chronic lower extremity pain (5th area of Pain) (Bilateral) (L>R); History of ankle surgery old (2006) (Left); Numbness of lower extremity  (Intermittent) (Bilateral); DDD (degenerative disc disease), lumbosacral; Osteoarthritis of knee (Right); Osteoarthritis of hips (Bilateral); Osteoarthritis of sacroiliac joints (Bilateral) (HCC); Cervical facet hypertrophy (Multilevel) (Bilateral); Cervical foraminal stenosis  (Right: C3-4) (Bilateral: C4-5, C5-6) (Left: C6-7); Grade 1 (5 mm) anterolisthesis of lumbar spine (L4/L5); Lumbar facet joint arthropathy (L4-5); Lumbosacral facet joint syndrome; Spondylosis without myelopathy or radiculopathy, lumbosacral region; Lumbar facet joint pain; Chronic sacroiliac joint pain (Bilateral) (R>L); Disorder of sacroiliac joints (Bilateral); Somatic dysfunction of sacroiliac joints (Bilateral); Other spondylosis, sacral and sacrococcygeal region; Abnormal MRI, lumbar spine (05/07/2022); Abnormal MRI, cervical spine (05/07/2022); Chronic upper extremity pain (Left); Cervical radiculitis; Acute neck pain; and Chronic upper extremity pain (Right) on their pertinent problem list. Pain Assessment: Severity of   is reported as a  /10. Location:    / . Onset:  . Quality:  . Timing:  . Modifying factor(s):  Marland Kitchen Vitals:  vitals were not taken for this visit.  BMI: Estimated body mass index is 37.76 kg/m as calculated from the following:   Height as of 07/05/23: 5\' 4"  (1.626 m).   Weight as of 07/05/23: 220 lb (99.8 kg). Last encounter: 07/05/2023. Last procedure: 06/16/2023.  Reason for encounter: evaluation of worsening, or previously known (established) problem.  The patient has requested this appointment to evaluate increased pain in both of her hips and lower back.  ***  Pharmacotherapy Assessment  Analgesic: None MME/day: 0 mg/day   Monitoring: [place] PMP: PDMP reviewed during this encounter.       Pharmacotherapy: No side-effects or adverse reactions reported. Compliance: No problems identified. Effectiveness: Clinically acceptable.  No notes on file  No results found for: "CBDTHCR" No results found for:  "D8THCCBX" No results found for: "D9THCCBX"  UDS:  Summary  Date Value Ref Range Status  10/18/2022 Note  Final    Comment:    ====================================================================  Compliance Drug Analysis, Ur ==================================================================== Test                             Result       Flag       Units  Drug Present and Declared for Prescription Verification   Gabapentin                     PRESENT      EXPECTED   Salicylate                     PRESENT      EXPECTED   Propranolol                    PRESENT      EXPECTED  Drug Present not Declared for Prescription Verification   Naproxen                       PRESENT      UNEXPECTED  Drug Absent but Declared for Prescription Verification   Fluoxetine                     Not Detected UNEXPECTED   Quetiapine                     Not Detected UNEXPECTED   Ibuprofen                      Not Detected UNEXPECTED    Ibuprofen, as indicated in the declared medication list, is not    always detected even when used as directed.  ==================================================================== Test                      Result    Flag   Units      Ref Range   Creatinine              139              mg/dL      >=95 ==================================================================== Declared Medications:  The flagging and interpretation on this report are based on the  following declared medications.  Unexpected results may arise from  inaccuracies in the declared medications.   **Note: The testing scope of this panel includes these medications:   Fluoxetine (Prozac)  Gabapentin (Neurontin)  Propranolol  Quetiapine (Seroquel)   **Note: The testing scope of this panel does not include small to  moderate amounts of these reported medications:   Aspirin  Ibuprofen (Advil)  Salicylate (Salicylamide)   **Note: The testing scope of this panel does not include the  following  reported medications:   Albuterol  Caffeine  Cetirizine (Zyrtec)  Fluticasone (Flonase)  Metformin (Janumet)  Montelukast  Multivitamin  Omeprazole  Ondansetron  Sitagliptin (Janumet) ==================================================================== For clinical consultation, please call [phone]. ====================================================================       ROS  Constitutional: Denies any fever or chills Gastrointestinal: No reported hemesis, hematochezia, vomiting, or acute GI distress Musculoskeletal: Denies any acute onset joint swelling, redness, loss of ROM, or weakness Neurological: No reported episodes of acute onset apraxia, aphasia, dysarthria, agnosia, amnesia, paralysis, loss of coordination, or loss of consciousness  Medication Review  Aspirin-Salicylamide-Caffeine, QUEtiapine, Rightest GL300 Lancets, SUMAtriptan, Vitamin D (Ergocalciferol), albuterol, atorvastatin, cetirizine, cyclobenzaprine, escitalopram, fluticasone, gabapentin, glucose blood, ibuprofen, lisinopril, meclizine, medroxyPROGESTERone, nortriptyline, omeprazole, ondansetron, propranolol ER, sitaGLIPtin-metformin, and traZODone  History Review  Allergy: Brenda Simmons has No Known Allergies. Drug: Brenda Simmons  reports no history of drug use. Alcohol:  reports current alcohol use. Tobacco:  reports that she has been smoking cigarettes. She has a 3.8 pack-year smoking history. She has never used smokeless tobacco. Social: Brenda Simmons  reports that she has been smoking cigarettes. She has a 3.8 pack-year smoking history. She has never used smokeless tobacco. She reports current alcohol use. She reports that she does not use drugs. Medical:  has a past medical history of Anxiety, Breast mass (x 3 mos), Bulging lumbar disc, Chronic bronchitis (HCC), Depression, Diabetes mellitus without complication (HCC), Hypertension, Migraines, Partial tear of rotator cuff, and Pre-diabetes. Surgical:  Brenda Simmons  has a past surgical history that includes Other surgical history (Left, ankle surgery); Salpingectomy (Bilateral); Ankle surgery (Left); Breast cyst aspiration (Right, age 16); and Tubal ligation (Bilateral). Family: family history includes Cancer in her maternal grandmother and paternal grandmother; Depression in her brother and mother; Diabetes in her mother; Heart failure in her mother; Hypertension in her father, mother, and sister.  Laboratory Chemistry Profile   Renal Lab Results  Component Value Date   BUN 10 03/16/2023   CREATININE 0.70 06/09/2023   BCR 8 (L) 10/18/2022   GFRAA >60 07/28/2016   GFRNONAA >60 03/16/2023    Hepatic Lab Results  Component Value Date   AST 14 10/18/2022   ALT 11 (L) 07/28/2016   ALBUMIN 3.8 (L) 10/18/2022   ALKPHOS 114 10/18/2022   LIPASE 103 09/17/2014    Electrolytes Lab Results  Component Value Date   NA 135 03/16/2023   K 4.1 03/16/2023   CL 104 03/16/2023   CALCIUM 8.4 (L) 03/16/2023   MG 1.7 10/18/2022    Bone Lab Results  Component Value Date   VD25OH 23.0 (L) 12/04/2015   25OHVITD1 9.2 (L) 10/18/2022   25OHVITD2 <1.0 10/18/2022   25OHVITD3 9.1 10/18/2022    Inflammation (CRP: Acute Phase) (ESR: Chronic Phase) Lab Results  Component Value Date   CRP 14 (H) 10/18/2022   ESRSEDRATE 7 10/18/2022         Note: Above Lab results reviewed.  Recent Imaging Review  MR LUMBAR SPINE WO CONTRAST CLINICAL DATA:  Provided history: Lumbar spondylosis. Spondylolisthesis of lumbar region. Lumbar radiculopathy. Lumbar radiculopathy, symptoms persist with greater than 6 weeks treatment.  EXAM: MRI LUMBAR SPINE WITHOUT CONTRAST  TECHNIQUE: Multiplanar, multisequence MR imaging of the lumbar spine was performed. No intravenous contrast was administered.  COMPARISON:  Lumbar spine radiographs 10/18/2022. CT abdomen/pelvis 03/14/2022.  FINDINGS: Segmentation: For the purposes of this dictation, the lowest well-formed  intervertebral disc space is designated L5-S1. A rudimentary disc space is present at S1-S2. 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  Alignment: Slight grade 1 anterolisthesis at L4-L5. 3 mm grade 1 anterolisthesis at L5-S1.  Vertebrae: Lumbar vertebral body height is maintained. No significant marrow edema or focal worrisome marrow lesion.  Conus medullaris and cauda equina: Conus extends to the L2 level. No signal abnormality identified within the visualized distal spinal cord.  Paraspinal and other soft tissues: No acute finding within included portions of the abdomen/retroperitoneum. No paraspinal mass or collection.  Disc levels:  Mild disc degeneration at L5-S1. Intervertebral disc height and hydration are largely preserved at the remaining lumbar levels.  T12-L1: No significant disc herniation or stenosis.  L1-L2: No significant disc herniation or stenosis.  L2-L3: No significant disc  herniation or stenosis.  L3-L4: Small disc bulge. Bilateral facet arthropathy (mild-to-moderate right, mild left). No significant spinal canal stenosis. Mild relative bilateral neural foraminal narrowing.  L4-L5: Slight grade 1 anterolisthesis. Small disc bulge. Bilateral facet arthropathy (moderate right, mild left). No significant spinal canal stenosis. Mild relative bilateral neural foraminal narrowing.  L5-S1: 3 mm grade 1 anterolisthesis. Disc uncovering with slight disc bulge. Advanced facet arthropathy bilaterally. No significant spinal canal or foraminal stenosis.  IMPRESSION: 1. For the purposes of this dictation, the lowest well-formed intervertebral disc space is designated L5-S1. A rudimentary disc space is present at S1-S2. 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 2. Lumbar spondylosis as outlined within  the body of the report. 3. No significant spinal canal stenosis. 4. No more than mild neural foraminal narrowing. 5. Multilevel facet arthropathy, greatest on the right at L3-L4 (mild-to-moderate), on the right at L4-L5 (moderate) and bilaterally at L5-S1 (advanced). 6. Grade 1 anterolisthesis at L4-L5 and L5-S1.  Electronically Signed   By: Jackey Loge D.O.   On: 08/12/2023 20:08 MR CERVICAL SPINE WO CONTRAST CLINICAL DATA:  Provided history: Cervical spondylosis. Degenerative disc disease, cervical. Cervical radiculopathy. Neck pain, chronic, degenerative changes on x-ray. Additional history provided by the scanning technologist: the patient reports bilateral neck and shoulder pain.  EXAM: MRI CERVICAL SPINE WITHOUT CONTRAST  TECHNIQUE: Multiplanar, multisequence MR imaging of the cervical spine was performed. No intravenous contrast was administered.  COMPARISON:  Cervical spine radiographs 10/18/2022.  FINDINGS: Alignment: No significant spondylolisthesis.  Vertebrae: Cervical vertebral body height is maintained. Degenerative endplate irregularity and edema at C5-C6 and C6-C7. Multilevel ventral osteophytes, most prominent at C5-C6 and C6-C7.  Cord: No signal abnormality identified within the cervical spinal cord. Mild flattening of the ventral spinal cord at multiple levels as described below.  Posterior Fossa, vertebral arteries, paraspinal tissues: No abnormality identified within included portions of the posterior fossa. Flow voids preserved within the imaged cervical vertebral arteries. No paraspinal mass or collection.  Disc levels:  Multilevel disc degeneration, greatest at C5-C6 and C6-C7 (advanced at these levels).  C2-C3: No significant disc herniation or stenosis.  C3-C4: A broad-based central disc protrusion effaces the ventral thecal sac and mildly flattens the ventral aspect of the spinal cord. However, the dorsal CSF space is maintained within  the spinal canal. No significant foraminal stenosis.  C4-C5: Disc bulge with bilateral uncovertebral hypertrophy. Superimposed broad-based central disc protrusion. The disc protrusion effaces the ventral thecal sac and mildly flattens the ventral aspect of the spinal cord (series 109, image 17). However, the dorsal CSF space is maintained within the spinal canal. Moderate bilateral neural foraminal narrowing.  C5-C6: Posterior disc osteophyte complex with bilateral disc osteophyte ridge/uncinate hypertrophy. Mild spinal canal stenosis. Severe bilateral neural foraminal narrowing.  C6-C7: Posterior disc osteophyte complex with bilateral disc osteophyte ridge/uncinate hypertrophy. Mild spinal canal stenosis. Moderate bilateral neural foraminal narrowing.  C7-T1: No significant disc herniation or stenosis.  IMPRESSION: 1. Cervical spondylosis as outlined within the body of the report. 2. At C5-C6, there is advanced disc degeneration with degenerative endplate edema. Posterior disc osteophyte complex with bilateral disc osteophyte ridge/uncinate hypertrophy. Mild spinal canal narrowing. Severe bilateral neural foraminal narrowing. 3. At C6-C7, there is advanced disc degeneration with degenerative endplate edema. Posterior disc osteophyte complex with bilateral disc osteophyte ridge/uncinate hypertrophy. Mild spinal canal narrowing. Moderate bilateral neural foraminal narrowing. 4. At C4-C5, a central disc protrusion effaces the ventral thecal sac  and mildly flattens the ventral aspect of the spinal cord. However, the dorsal CSF space is maintained within the spinal canal. Multifactorial moderate bilateral neural foraminal narrowing. 5. At C3-C4, a central disc protrusion effaces the ventral thecal sac and mildly flattens the ventral aspect of the spinal cord. However, the dorsal CSF space is maintained within the spinal canal.  Electronically Signed   By: Jackey Loge D.O.   On:  08/12/2023 19:54 MR BRAIN/IAC W WO CONTRAST CLINICAL DATA:  Provided history: Dizziness. Additional history provided by the scanning technologist: The patient reports headaches, right-sided hearing loss.  EXAM: MRI HEAD WITHOUT AND WITH CONTRAST  TECHNIQUE: Multiplanar, multiecho pulse sequences of the brain and surrounding structures were obtained without and with intravenous contrast.  CONTRAST:  10 mL Vueway intravenous contrast.  COMPARISON:  Report from head CT 05/23/2004 (images unavailable).  FINDINGS: Brain:  No age advanced or lobar predominant parenchymal atrophy.  3 mm hypoenhancing or nonenhancing focus within the posterior aspect of the pituitary gland (for instance as seen on series 21, image 9) (series 22, images 47-49).  No cortical encephalomalacia is identified. No significant cerebral white matter disease.  No cerebellopontine angle or internal auditory canal mass is demonstrated. Unremarkable appearance of the 7th and 8th cranial nerves bilaterally.  There is no acute infarct.  No chronic intracranial blood products.  No extra-axial fluid collection.  No midline shift.  Vascular: Maintained flow voids within the proximal large arterial vessels.  Skull and upper cervical spine: No focal worrisome marrow lesion. Incompletely assessed cervical spondylosis.  Sinuses/Orbits: No mass or acute finding within the imaged orbits. Minimal mucosal thickening within a posterior right ethmoid air cell.  Impression #3 will be called to the ordering clinician or representative by the Radiologist Assistant, and communication documented in the PACS or [employer].  IMPRESSION: 1. No evidence of an acute or recent subacute infarction. 2. No cerebellopontine angle or internal auditory canal mass. 3. 3 mm hypoenhancing or non-enhancing focus within the posterior aspect of the pituitary gland, which may reflect a cyst or a pituitary microadenoma. No  further imaging evaluation or imaging follow-up is necessary. Correlate for a history of pituitary hypersecretion. This follows ACR consensus guidelines: Management of Incidental Pituitary Findings on CT, MRI and F18-FDG PET: A White Paper of the ACR Incidental Findings Committee. J Am Coll Radiol 2018; 15: 966-72. 4. Otherwise unremarkable MRI appearance of the brain. 5. Minor right ethmoid sinus mucosal thickening  Electronically Signed   By: Jackey Loge D.O.   On: 08/12/2023 19:37 Note: Reviewed        Physical Exam  General appearance: Well nourished, well developed, and well hydrated. In no apparent acute distress Mental status: Alert, oriented x 3 (person, place, & time)       Respiratory: No evidence of acute respiratory distress Eyes: PERLA Vitals: There were no vitals taken for this visit. BMI: Estimated body mass index is 37.76 kg/m as calculated from the following:   Height as of 07/05/23: 5\' 4"  (1.626 m).   Weight as of 07/05/23: 220 lb (99.8 kg). Ideal: Patient weight not recorded  Assessment   Diagnosis Status  1. Chronic low back pain (1ry area of Pain) (Bilateral) (R>L) w/o sciatica   2. Chronic hip pain (3ry area of Pain) (Bilateral) (R>L)    Controlled Controlled Controlled   Updated Problems: No problems updated.  Plan of Care  Problem-specific:  No problem-specific Assessment & Plan notes found for this encounter.  Ms. Brie Gowell  Simmons has a current medication list which includes the following long-term medication(s): albuterol, depo-provera, escitalopram, fluticasone, gabapentin, lisinopril, omeprazole, propranolol er, sitagliptin-metformin, and sumatriptan.  Pharmacotherapy (Medications Ordered): No orders of the defined types were placed in this encounter.  Orders:  No orders of the defined types were placed in this encounter.  Follow-up plan:   No follow-ups on file.      Interventional Therapies  Risk Factors  Considerations:  MO   [id number]  HTN  Anxiety   Planned  Pending:   Cervical and lumbar MRI (08/05/2023) & NS follow-up evaluation   Under consideration:      Completed:   Therapeutic right cervical ESI x2 (06/16/2023) (100/100 x5 hrs/50% x days/0)  Diagnostic bilateral sacroiliac joint Blk x1 (80/0/0/0)  Diagnostic bilateral (L4-5, L5-S1) lumbar facet (L3-S1) MBB x1 (100/80/0/0)    Completed by other providers:   None at this time   Therapeutic  Palliative (PRN) options:   None established       Recent Visits Date Type Provider Dept  07/05/23 Office Visit Delano Metz, MD [facility]  06/16/23 Procedure visit Delano Metz, MD [facility]  06/02/23 Office Visit Delano Metz, MD [facility]  Showing recent visits within past 90 days and meeting all other requirements Future Appointments Date Type Provider Dept  08/17/23 Appointment Delano Metz, MD [facility]  Showing future appointments within next 90 days and meeting all other requirements  I discussed the assessment and treatment plan with the patient. The patient was provided an opportunity to ask questions and all were answered. The patient agreed with the plan and demonstrated an understanding of the instructions.  Patient advised to call back or seek an in-person evaluation if the symptoms or condition worsens.  Duration of encounter: *** minutes.  Total time on encounter, as per AMA guidelines included both the face-to-face and non-face-to-face time personally spent by the physician and/or other qualified health care professional(s) on the day of the encounter (includes time in activities that require the physician or other qualified health care professional and does not include time in activities normally performed by clinical staff). Physician's time may include the following activities when performed: Preparing to see the patient (e.g., pre-charting review of records,  searching for previously ordered imaging, lab work, and nerve conduction tests) Review of prior analgesic pharmacotherapies. Reviewing PMP Interpreting ordered tests (e.g., lab work, imaging, nerve conduction tests) Performing post-procedure evaluations, including interpretation of diagnostic procedures Obtaining and/or reviewing separately obtained history Performing a medically appropriate examination and/or evaluation Counseling and educating the patient/family/caregiver Ordering medications, tests, or procedures Referring and communicating with other health care professionals (when not separately reported) Documenting clinical information in the electronic or other health record Independently interpreting results (not separately reported) and communicating results to the patient/ family/caregiver Care coordination (not separately reported)  Note by: Oswaldo Done, MD Date: 08/17/2023; Time: 12:35 PM

## 2023-08-17 ENCOUNTER — Ambulatory Visit (HOSPITAL_BASED_OUTPATIENT_CLINIC_OR_DEPARTMENT_OTHER): Payer: MEDICAID | Admitting: Pain Medicine

## 2023-08-17 DIAGNOSIS — Z91199 Patient's noncompliance with other medical treatment and regimen due to unspecified reason: Secondary | ICD-10-CM

## 2023-08-17 DIAGNOSIS — G8929 Other chronic pain: Secondary | ICD-10-CM

## 2023-08-23 NOTE — Progress Notes (Unsigned)
PROVIDER NOTE: Information contained herein reflects review and annotations entered in association with encounter. Interpretation of such information and data should be left to medically-trained personnel. Information provided to patient can be located elsewhere in the medical record under "Patient Instructions". Document created using STT-dictation technology, any transcriptional errors that may result from process are unintentional.    Patient: Brenda Simmons  Service Category: E/M  Provider: Oswaldo Done, MD  DOB: May 07, 1976  DOS: 08/24/2023  Referring Provider: Sandrea Hughs, NP  MRN: [id number]  Specialty: Interventional Pain Management  PCP: Sandrea Hughs, NP  Type: Established Patient  Setting: Ambulatory outpatient    Location: Office  Delivery: Face-to-face     HPI  Brenda Simmons, a 47 y.o. year old female, is here today because of her Chronic hip pain, bilateral [M25.551, M25.552, G89.29]. Brenda Simmons's primary complain today is No chief complaint on file.  Pertinent problems: Brenda Simmons has Disorder of rotator cuff; Chronic neck pain (2ry area of Pain) (Bilateral) (R>L); Chronic shoulder pain (Right); Migraine headache without aura; Migrainous dizziness; Right-sided low back pain with right-sided sciatica; Bilateral lower extremity pain; Chronic tension-type headache, not intractable; Herniated cervical disc; Osteoarthritis of spine with radiculopathy, cervical region; Right rotator cuff tear; Shoulder tendinitis, right; Abnormal CT scan, cervical spine (01/24/2022); DDD (degenerative disc disease), cervical (C5-C7); Chronic pain syndrome; Chronic low back pain (1ry area of Pain) (Bilateral) (R>L) w/o sciatica; Chronic hip pain (3ry area of Pain) (Bilateral) (R>L); Chronic knee pain (4th area of Pain) (Bilateral) (R>L); Chronic lower extremity pain (5th area of Pain) (Bilateral) (L>R); History of ankle surgery old (2006) (Left); Numbness of lower extremity (Intermittent)  (Bilateral); DDD (degenerative disc disease), lumbosacral; Osteoarthritis of knee (Right); Osteoarthritis of hips (Bilateral); Osteoarthritis of sacroiliac joints (Bilateral) (HCC); Cervical facet hypertrophy (Multilevel) (Bilateral); Cervical foraminal stenosis  (Right: C3-4) (Bilateral: C4-5, C5-6) (Left: C6-7); Grade 1 (5 mm) Anterolisthesis of lumbar spine (L4/L5 & L5/S1); Lumbar facet joint arthropathy (L4-5); Lumbosacral facet joint syndrome; Spondylosis without myelopathy or radiculopathy, lumbosacral region; Lumbar facet joint pain; Chronic sacroiliac joint pain (Bilateral) (R>L); Disorder of sacroiliac joints (Bilateral); Somatic dysfunction of sacroiliac joints (Bilateral); Other spondylosis, sacral and sacrococcygeal region; Abnormal MRI, lumbar spine (08/12/2023); Abnormal MRI, cervical spine (08/12/2023); Chronic upper extremity pain (Left); Cervical radiculitis; Acute neck pain; and Chronic upper extremity pain (Right) on their pertinent problem list. Pain Assessment: Severity of   is reported as a  /10. Location:    / . Onset:  . Quality:  . Timing:  . Modifying factor(s):  Marland Kitchen Vitals:  vitals were not taken for this visit.  BMI: Estimated body mass index is 37.76 kg/m as calculated from the following:   Height as of 07/05/23: 5\' 4"  (1.626 m).   Weight as of 07/05/23: 220 lb (99.8 kg). Last encounter: 08/17/2023. Last procedure: 06/16/2023.  Reason for encounter: evaluation of worsening, or previously known (established) problem.  The patient has requested this appointment to evaluate her hip and low back pain. The patient had previously requested an appointment for evaluation on 08/17/2023 but she did not show up to that appointment.  (08/12/2023) LUMBAR MRI FINDINGS: Segmentation: For the purposes of this dictation, the lowest well-formed intervertebral disc space is designated L5-S1. A rudimentary disc space is present at S1-S2. Correlating with the prior CT abdomen/pelvis of 10/14/2021,  very hypoplastic ribs are present bilaterally at the L1 level. Alignment: Slight grade 1 anterolisthesis at L4-L5. 3 mm grade 1 anterolisthesis at L5-S1. Conus medullaris and cauda equina: Conus  extends to the L2 level.   DISC LEVELS: Mild disc degeneration at L5-S1.  L3-4: Small disc bulge. Bilateral facet arthropathy (mild-to-moderate right, mild left). Mild relative bilateral neural foraminal narrowing.   L4-5: Slight grade 1 anterolisthesis. Small disc bulge. Bilateral facet arthropathy (moderate right, mild left). Mild relative bilateral neural foraminal narrowing.   L5-S1: 3 mm grade 1 anterolisthesis. Disc uncovering with slight disc bulge. Advanced facet arthropathy bilaterally.   IMPRESSION: 1. For the purposes of this dictation, the lowest well-formed intervertebral disc space is designated L5-S1. A rudimentary disc space is present at S1-2. 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 2. Lumbar spondylosis as outlined within the body of the report. 3. No significant spinal canal stenosis. 4. No more than mild neural foraminal narrowing. 5. Multilevel facet arthropathy, greatest on the right at L3-4 (mild-to-moderate), on the right at L4-5 (moderate) and bilaterally at L5-S1 (advanced). 6. Grade 1 anterolisthesis at L4-5 and L5-S1.  (08/12/2023) CERVICAL MRI FINDINGS: Vertebrae: Degenerative endplate irregularity and edema at C5-6 and C6-7.   DISC LEVELS: Multilevel disc degeneration, greatest at C5-6 and C6-7 (advanced at these levels). C3-4: A broad-based central disc protrusion effaces the ventral thecal sac and mildly flattens the ventral aspect of the spinal cord.  C4-5: Disc bulge with bilateral uncovertebral hypertrophy. Superimposed broad-based central disc protrusion. Moderate bilateral neural foraminal narrowing. C5-6: Posterior disc osteophyte complex with bilateral disc osteophyte  ridge/uncinate hypertrophy. Mild spinal canal stenosis. Severe bilateral neural foraminal narrowing.   C6-7: Posterior disc osteophyte complex with bilateral disc osteophyte ridge/uncinate hypertrophy. Mild spinal canal stenosis. Moderate bilateral neural foraminal narrowing.    IMPRESSION: 1. Cervical spondylosis as outlined within the body of the report. 2. At C5-6, there is advanced disc degeneration with degenerative endplate edema. Posterior disc osteophyte complex with bilateral disc osteophyte ridge/uncinate hypertrophy. Mild spinal canal narrowing. Severe bilateral neural foraminal narrowing. 3. At C6-7, there is advanced disc degeneration with degenerative endplate edema. Posterior disc osteophyte complex with bilateral disc osteophyte ridge/uncinate hypertrophy. Mild spinal canal narrowing. Moderate bilateral neural foraminal narrowing. 4. At C4-5, a central disc protrusion effaces the ventral thecal sac and mildly flattens the ventral aspect of the spinal cord. Multifactorial moderate bilateral neural foraminal narrowing. 5. At C3-4, a central disc protrusion effaces the ventral thecal sac and mildly flattens the ventral aspect of the spinal cord.   Discussed the use of AI scribe software for clinical note transcription with the patient, who gave verbal consent to proceed.  History of Present Illness          Pharmacotherapy Assessment  Analgesic: None MME/day: 0 mg/day   Monitoring: [place] PMP: PDMP reviewed during this encounter.       Pharmacotherapy: No side-effects or adverse reactions reported. Compliance: No problems identified. Effectiveness: Clinically acceptable.  No notes on file  No results found for: "CBDTHCR" No results found for: "D8THCCBX" No results found for: "D9THCCBX"  UDS:  Summary  Date Value Ref Range Status  10/18/2022 Note  Final    Comment:    ==================================================================== Compliance Drug Analysis,  [place] ==================================================================== Test                             Result       Flag       Units  Drug Present and Declared for Prescription Verification   Gabapentin  PRESENT      EXPECTED   Salicylate                     PRESENT      EXPECTED   Propranolol                    PRESENT      EXPECTED  Drug Present not Declared for Prescription Verification   Naproxen                       PRESENT      UNEXPECTED  Drug Absent but Declared for Prescription Verification   Fluoxetine                     Not Detected UNEXPECTED   Quetiapine                     Not Detected UNEXPECTED   Ibuprofen                      Not Detected UNEXPECTED    Ibuprofen, as indicated in the declared medication list, is not    always detected even when used as directed.  ==================================================================== Test                      Result    Flag   Units      Ref Range   Creatinine              139              mg/dL      >=16 ==================================================================== Declared Medications:  The flagging and interpretation on this report are based on the  following declared medications.  Unexpected results may arise from  inaccuracies in the declared medications.   **Note: The testing scope of this panel includes these medications:   Fluoxetine (Prozac)  Gabapentin (Neurontin)  Propranolol  Quetiapine (Seroquel)   **Note: The testing scope of this panel does not include small to  moderate amounts of these reported medications:   Aspirin  Ibuprofen (Advil)  Salicylate (Salicylamide)   **Note: The testing scope of this panel does not include the  following reported medications:   Albuterol  Caffeine  Cetirizine (Zyrtec)  Fluticasone (Flonase)  Metformin (Janumet)  Montelukast  Multivitamin  Omeprazole  Ondansetron  Sitagliptin  (Janumet) ==================================================================== For clinical consultation, please call [phone]. ====================================================================       ROS  Constitutional: Denies any fever or chills Gastrointestinal: No reported hemesis, hematochezia, vomiting, or acute GI distress Musculoskeletal: Denies any acute onset joint swelling, redness, loss of ROM, or weakness Neurological: No reported episodes of acute onset apraxia, aphasia, dysarthria, agnosia, amnesia, paralysis, loss of coordination, or loss of consciousness  Medication Review  Aspirin-Salicylamide-Caffeine, QUEtiapine, Rightest GL300 Lancets, SUMAtriptan, Vitamin D (Ergocalciferol), albuterol, atorvastatin, cetirizine, cyclobenzaprine, escitalopram, fluticasone, gabapentin, glucose blood, ibuprofen, lisinopril, meclizine, medroxyPROGESTERone, nortriptyline, omeprazole, ondansetron, propranolol ER, sitaGLIPtin-metformin, and traZODone  History Review  Allergy: Brenda Simmons has No Known Allergies. Drug: Brenda Simmons  reports no history of drug use. Alcohol:  reports current alcohol use. Tobacco:  reports that she has been smoking cigarettes. She has a 3.8 pack-year smoking history. She has never used smokeless tobacco. Social: Brenda Simmons  reports that she has been smoking cigarettes. She has a 3.8 pack-year smoking history. She has never used smokeless tobacco. She reports current alcohol use. She reports  that she does not use drugs. Medical:  has a past medical history of Anxiety, Breast mass (x 3 mos), Bulging lumbar disc, Chronic bronchitis (HCC), Depression, Diabetes mellitus without complication (HCC), Hypertension, Migraines, Partial tear of rotator cuff, and Pre-diabetes. Surgical: Brenda Simmons  has a past surgical history that includes Other surgical history (Left, ankle surgery); Salpingectomy (Bilateral); Ankle surgery (Left); Breast cyst aspiration (Right, age  85); and Tubal ligation (Bilateral). Family: family history includes Cancer in her maternal grandmother and paternal grandmother; Depression in her brother and mother; Diabetes in her mother; Heart failure in her mother; Hypertension in her father, mother, and sister.  Laboratory Chemistry Profile   Renal Lab Results  Component Value Date   BUN 10 03/16/2023   CREATININE 0.70 06/09/2023   BCR 8 (L) 10/18/2022   GFRAA >60 07/28/2016   GFRNONAA >60 03/16/2023    Hepatic Lab Results  Component Value Date   AST 14 10/18/2022   ALT 11 (L) 07/28/2016   ALBUMIN 3.8 (L) 10/18/2022   ALKPHOS 114 10/18/2022   LIPASE 103 09/17/2014    Electrolytes Lab Results  Component Value Date   NA 135 03/16/2023   K 4.1 03/16/2023   CL 104 03/16/2023   CALCIUM 8.4 (L) 03/16/2023   MG 1.7 10/18/2022    Bone Lab Results  Component Value Date   VD25OH 23.0 (L) 12/04/2015   25OHVITD1 9.2 (L) 10/18/2022   25OHVITD2 <1.0 10/18/2022   25OHVITD3 9.1 10/18/2022    Inflammation (CRP: Acute Phase) (ESR: Chronic Phase) Lab Results  Component Value Date   CRP 14 (H) 10/18/2022   ESRSEDRATE 7 10/18/2022         Note: Above Lab results reviewed.  Recent Imaging Review  MR LUMBAR SPINE WO CONTRAST CLINICAL DATA:  Provided history: Lumbar spondylosis. Spondylolisthesis of lumbar region. Lumbar radiculopathy. Lumbar radiculopathy, symptoms persist with greater than 6 weeks treatment.  EXAM: MRI LUMBAR SPINE WITHOUT CONTRAST  TECHNIQUE: Multiplanar, multisequence MR imaging of the lumbar spine was performed. No intravenous contrast was administered.  COMPARISON:  Lumbar spine radiographs 10/18/2022. CT abdomen/pelvis 03/14/2022.  FINDINGS: Segmentation: For the purposes of this dictation, the lowest well-formed intervertebral disc space is designated L5-S1. A rudimentary disc space is present at S1-S2. Correlating with the prior CT abdomen/pelvis of 10/14/2021, very hypoplastic ribs  are present bilaterally at the L1 level.  Alignment: Slight grade 1 anterolisthesis at L4-L5. 3 mm grade 1 anterolisthesis at L5-S1.  Vertebrae: Lumbar vertebral body height is maintained. No significant marrow edema or focal worrisome marrow lesion.  Conus medullaris and cauda equina: Conus extends to the L2 level. No signal abnormality identified within the visualized distal spinal cord.  Paraspinal and other soft tissues: No acute finding within included portions of the abdomen/retroperitoneum. No paraspinal mass or collection.  Disc levels:  Mild disc degeneration at L5-S1. Intervertebral disc height and hydration are largely preserved at the remaining lumbar levels.  T12-L1: No significant disc herniation or stenosis.  L1-L2: No significant disc herniation or stenosis.  L2-L3: No significant disc herniation or stenosis.  L3-L4: Small disc bulge. Bilateral facet arthropathy (mild-to-moderate right, mild left). No significant spinal canal stenosis. Mild relative bilateral neural foraminal narrowing.  L4-L5: Slight grade 1 anterolisthesis. Small disc bulge. Bilateral facet arthropathy (moderate right, mild left). No significant spinal canal stenosis. Mild relative bilateral neural foraminal narrowing.  L5-S1: 3 mm grade 1 anterolisthesis. Disc uncovering with slight disc bulge. Advanced facet arthropathy bilaterally. No significant spinal canal or foraminal stenosis.  IMPRESSION:  1. For the purposes of this dictation, the lowest well-formed intervertebral disc space is designated L5-S1. A rudimentary disc space is present at S1-S2. 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 2. Lumbar spondylosis as outlined within the body of the report. 3. No significant spinal canal stenosis. 4. No more than mild neural foraminal narrowing. 5. Multilevel facet arthropathy, greatest on the right at  L3-L4 (mild-to-moderate), on the right at L4-L5 (moderate) and bilaterally at L5-S1 (advanced). 6. Grade 1 anterolisthesis at L4-L5 and L5-S1.  Electronically Signed   By: Jackey Loge D.O.   On: 08/12/2023 20:08 MR CERVICAL SPINE WO CONTRAST CLINICAL DATA:  Provided history: Cervical spondylosis. Degenerative disc disease, cervical. Cervical radiculopathy. Neck pain, chronic, degenerative changes on x-ray. Additional history provided by the scanning technologist: the patient reports bilateral neck and shoulder pain.  EXAM: MRI CERVICAL SPINE WITHOUT CONTRAST  TECHNIQUE: Multiplanar, multisequence MR imaging of the cervical spine was performed. No intravenous contrast was administered.  COMPARISON:  Cervical spine radiographs 10/18/2022.  FINDINGS: Alignment: No significant spondylolisthesis.  Vertebrae: Cervical vertebral body height is maintained. Degenerative endplate irregularity and edema at C5-C6 and C6-C7. Multilevel ventral osteophytes, most prominent at C5-C6 and C6-C7.  Cord: No signal abnormality identified within the cervical spinal cord. Mild flattening of the ventral spinal cord at multiple levels as described below.  Posterior Fossa, vertebral arteries, paraspinal tissues: No abnormality identified within included portions of the posterior fossa. Flow voids preserved within the imaged cervical vertebral arteries. No paraspinal mass or collection.  Disc levels:  Multilevel disc degeneration, greatest at C5-C6 and C6-C7 (advanced at these levels).  C2-C3: No significant disc herniation or stenosis.  C3-C4: A broad-based central disc protrusion effaces the ventral thecal sac and mildly flattens the ventral aspect of the spinal cord. However, the dorsal CSF space is maintained within the spinal canal. No significant foraminal stenosis.  C4-C5: Disc bulge with bilateral uncovertebral hypertrophy. Superimposed broad-based central disc protrusion. The  disc protrusion effaces the ventral thecal sac and mildly flattens the ventral aspect of the spinal cord (series 109, image 17). However, the dorsal CSF space is maintained within the spinal canal. Moderate bilateral neural foraminal narrowing.  C5-C6: Posterior disc osteophyte complex with bilateral disc osteophyte ridge/uncinate hypertrophy. Mild spinal canal stenosis. Severe bilateral neural foraminal narrowing.  C6-C7: Posterior disc osteophyte complex with bilateral disc osteophyte ridge/uncinate hypertrophy. Mild spinal canal stenosis. Moderate bilateral neural foraminal narrowing.  C7-T1: No significant disc herniation or stenosis.  IMPRESSION: 1. Cervical spondylosis as outlined within the body of the report. 2. At C5-C6, there is advanced disc degeneration with degenerative endplate edema. Posterior disc osteophyte complex with bilateral disc osteophyte ridge/uncinate hypertrophy. Mild spinal canal narrowing. Severe bilateral neural foraminal narrowing. 3. At C6-C7, there is advanced disc degeneration with degenerative endplate edema. Posterior disc osteophyte complex with bilateral disc osteophyte ridge/uncinate hypertrophy. Mild spinal canal narrowing. Moderate bilateral neural foraminal narrowing. 4. At C4-C5, a central disc protrusion effaces the ventral thecal sac and mildly flattens the ventral aspect of the spinal cord. However, the dorsal CSF space is maintained within the spinal canal. Multifactorial moderate bilateral neural foraminal narrowing. 5. At C3-C4, a central disc protrusion effaces the ventral thecal sac and mildly flattens the ventral aspect of the spinal cord. However, the dorsal CSF space is maintained within the spinal canal.  Electronically Signed   By: Jackey Loge D.O.   On: 08/12/2023 19:54 MR [employer] W WO  CONTRAST CLINICAL DATA:  Provided history: Dizziness. Additional history provided by the scanning technologist: The patient  reports headaches, right-sided hearing loss.  EXAM: MRI HEAD WITHOUT AND WITH CONTRAST  TECHNIQUE: Multiplanar, multiecho pulse sequences of the brain and surrounding structures were obtained without and with intravenous contrast.  CONTRAST:  10 mL Vueway intravenous contrast.  COMPARISON:  Report from head CT 05/23/2004 (images unavailable).  FINDINGS: Brain:  No age advanced or lobar predominant parenchymal atrophy.  3 mm hypoenhancing or nonenhancing focus within the posterior aspect of the pituitary gland (for instance as seen on series 21, image 9) (series 22, images 47-49).  No cortical encephalomalacia is identified. No significant cerebral white matter disease.  No cerebellopontine angle or internal auditory canal mass is demonstrated. Unremarkable appearance of the 7th and 8th cranial nerves bilaterally.  There is no acute infarct.  No chronic intracranial blood products.  No extra-axial fluid collection.  No midline shift.  Vascular: Maintained flow voids within the proximal large arterial vessels.  Skull and upper cervical spine: No focal worrisome marrow lesion. Incompletely assessed cervical spondylosis.  Sinuses/Orbits: No mass or acute finding within the imaged orbits. Minimal mucosal thickening within a posterior right ethmoid air cell.  Impression #3 will be called to the ordering clinician or representative by the Radiologist Assistant, and communication documented in the PACS or [employer].  IMPRESSION: 1. No evidence of an acute or recent subacute infarction. 2. No cerebellopontine angle or internal auditory canal mass. 3. 3 mm hypoenhancing or non-enhancing focus within the posterior aspect of the pituitary gland, which may reflect a cyst or a pituitary microadenoma. No further imaging evaluation or imaging follow-up is necessary. Correlate for a history of pituitary hypersecretion. This follows ACR consensus guidelines: Management  of Incidental Pituitary Findings on CT, MRI and F18-FDG PET: A White Paper of the ACR Incidental Findings Committee. J Am Coll Radiol 2018; 15: 966-72. 4. Otherwise unremarkable MRI appearance of the brain. 5. Minor right ethmoid sinus mucosal thickening  Electronically Signed   By: Jackey Loge D.O.   On: 08/12/2023 19:37 Note: Reviewed        Physical Exam  General appearance: Well nourished, well developed, and well hydrated. In no apparent acute distress Mental status: Alert, oriented x 3 (person, place, & time)       Respiratory: No evidence of acute respiratory distress Eyes: PERLA Vitals: There were no vitals taken for this visit. BMI: Estimated body mass index is 37.76 kg/m as calculated from the following:   Height as of 07/05/23: 5\' 4"  (1.626 m).   Weight as of 07/05/23: 220 lb (99.8 kg). Ideal: Patient weight not recorded  Assessment   Diagnosis Status  1. Chronic hip pain (3ry area of Pain) (Bilateral) (R>L)   2. Chronic low back pain (1ry area of Pain) (Bilateral) (R>L) w/o sciatica   3. Lumbar facet joint pain   4. Lumbar facet joint arthropathy (L4-5)   5. Lumbosacral facet joint syndrome   6. Osteoarthritis of hips (Bilateral)   7. Spondylosis without myelopathy or radiculopathy, lumbosacral region   8. Abnormal MRI, lumbar spine (08/12/2023)   9. Abnormal MRI, cervical spine (08/12/2023)   10. Grade 1 (5 mm) Anterolisthesis of lumbar spine (L4/L5 & L5/S1)    Controlled Controlled Controlled   Updated Problems: Problem  Abnormal MRI, lumbar spine (08/12/2023)   (08/12/2023) LUMBAR MRI FINDINGS: Segmentation: For the purposes of this dictation, the lowest well-formed intervertebral disc space is designated L5-S1. A rudimentary disc space is  present at S1-S2. 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 Alignment: Slight grade 1 anterolisthesis at L4-5. 3 mm grade 1 anterolisthesis  at L5-S1. Conus medullaris and cauda equina: Conus extends to the L2 level.   DISC LEVELS: Mild disc degeneration at L5-S1.  L3-4: Small disc bulge. Bilateral facet arthropathy (mild-to-moderate right, mild left). Mild relative bilateral neural foraminal narrowing.   L4-5: Slight grade 1 anterolisthesis. Small disc bulge. Bilateral facet arthropathy (moderate right, mild left). Mild relative bilateral neural foraminal narrowing.   L5-S1: 3 mm grade 1 anterolisthesis. Disc uncovering with slight disc bulge. Advanced facet arthropathy bilaterally.   IMPRESSION: 1. For the purposes of this dictation, the lowest well-formed intervertebral disc space is designated L5-S1. A rudimentary disc space is present at S1-2. 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 2. Lumbar spondylosis as outlined within the body of the report. 3. No significant spinal canal stenosis. 4. No more than mild neural foraminal narrowing. 5. Multilevel facet arthropathy, greatest on the right at L3-4 (mild-to-moderate), on the right at L4-5 (moderate) and bilaterally at L5-S1 (advanced). 6. Grade 1 anterolisthesis at L4-5 and L5-S1.   Abnormal MRI, cervical spine (08/12/2023)   (08/12/2023) CERVICAL MRI FINDINGS: Vertebrae: Degenerative endplate irregularity and edema at C5-6 and C6-7.   DISC LEVELS: Multilevel disc degeneration, greatest at C5-6 and C6-7 (advanced at these levels). C3-4: A broad-based central disc protrusion effaces the ventral thecal sac and mildly flattens the ventral aspect of the spinal cord.  C4-5: Disc bulge with bilateral uncovertebral hypertrophy. Superimposed broad-based central disc protrusion. Moderate bilateral neural foraminal narrowing. C5-6: Posterior disc osteophyte complex with bilateral disc osteophyte ridge/uncinate hypertrophy. Mild spinal canal stenosis. Severe bilateral neural foraminal narrowing.   C6-7:  Posterior disc osteophyte complex with bilateral disc osteophyte ridge/uncinate hypertrophy. Mild spinal canal stenosis. Moderate bilateral neural foraminal narrowing.    IMPRESSION: 1. Cervical spondylosis as outlined within the body of the report. 2. At C5-6, there is advanced disc degeneration with degenerative endplate edema. Posterior disc osteophyte complex with bilateral disc osteophyte ridge/uncinate hypertrophy. Mild spinal canal narrowing. Severe bilateral neural foraminal narrowing. 3. At C6-7, there is advanced disc degeneration with degenerative endplate edema. Posterior disc osteophyte complex with bilateral disc osteophyte ridge/uncinate hypertrophy. Mild spinal canal narrowing. Moderate bilateral neural foraminal narrowing. 4. At C4-5, a central disc protrusion effaces the ventral thecal sac and mildly flattens the ventral aspect of the spinal cord. Multifactorial moderate bilateral neural foraminal narrowing. 5. At C3-4, a central disc protrusion effaces the ventral thecal sac and mildly flattens the ventral aspect of the spinal cord.    Grade 1 (5 mm) Anterolisthesis of lumbar spine (L4/L5 & L5/S1)    Plan of Care  Problem-specific:  Assessment and Plan            Brenda Simmons has a current medication list which includes the following long-term medication(s): albuterol, depo-provera, escitalopram, fluticasone, gabapentin, lisinopril, omeprazole, propranolol er, sitagliptin-metformin, and sumatriptan.  Pharmacotherapy (Medications Ordered): No orders of the defined types were placed in this encounter.  Orders:  No orders of the defined types were placed in this encounter.  Follow-up plan:   No follow-ups on file.      Interventional Therapies  Risk Factors  Considerations:  MO  [id number]  HTN  Anxiety   Planned  Pending:   Cervical and lumbar MRI (11/8-15/2024) & NS follow-up  evaluation   Under consideration:   Diagnostic/therapeutic bilateral IA  hip injection #1  Diagnostic/therapeutic bilateral L3-4, L4-5 TFESI #1  Diagnostic bilateral (L3-4, L4-5, and L5-S1) lumbar facet (L2-S1) medial branch block #1    Completed:   Therapeutic right cervical ESI x2 (06/16/2023) (100/100 x5 hrs/50% x days/0)  Diagnostic bilateral sacroiliac joint Blk x1 (80/0/0/0)  Diagnostic bilateral (L4-5, L5-S1) lumbar facet (L3-S1) MBB x1 (100/80/0/0)    Completed by other providers:   None at this time   Therapeutic  Palliative (PRN) options:   None established      Recent Visits Date Type Provider Dept  07/05/23 Office Visit Delano Metz, MD [facility]  06/16/23 Procedure visit Delano Metz, MD [facility]  06/02/23 Office Visit Delano Metz, MD [facility]  Showing recent visits within past 90 days and meeting all other requirements Future Appointments Date Type Provider Dept  08/24/23 Appointment Delano Metz, MD [facility]  Showing future appointments within next 90 days and meeting all other requirements  I discussed the assessment and treatment plan with the patient. The patient was provided an opportunity to ask questions and all were answered. The patient agreed with the plan and demonstrated an understanding of the instructions.  Patient advised to call back or seek an in-person evaluation if the symptoms or condition worsens.  Duration of encounter: *** minutes.  Total time on encounter, as per AMA guidelines included both the face-to-face and non-face-to-face time personally spent by the physician and/or other qualified health care professional(s) on the day of the encounter (includes time in activities that require the physician or other qualified health care professional and does not include time in activities normally performed by clinical staff). Physician's time may include the following activities when performed: Preparing to see the patient (e.g., pre-charting  review of records, searching for previously ordered imaging, lab work, and nerve conduction tests) Review of prior analgesic pharmacotherapies. Reviewing PMP Interpreting ordered tests (e.g., lab work, imaging, nerve conduction tests) Performing post-procedure evaluations, including interpretation of diagnostic procedures Obtaining and/or reviewing separately obtained history Performing a medically appropriate examination and/or evaluation Counseling and educating the patient/family/caregiver Ordering medications, tests, or procedures Referring and communicating with other health care professionals (when not separately reported) Documenting clinical information in the electronic or other health record Independently interpreting results (not separately reported) and communicating results to the patient/ family/caregiver Care coordination (not separately reported)  Note by: Oswaldo Done, MD Date: 08/24/2023; Time: 9:41 PM

## 2023-08-24 ENCOUNTER — Ambulatory Visit (HOSPITAL_BASED_OUTPATIENT_CLINIC_OR_DEPARTMENT_OTHER): Payer: MEDICAID | Admitting: Pain Medicine

## 2023-08-24 DIAGNOSIS — M545 Low back pain, unspecified: Secondary | ICD-10-CM

## 2023-08-24 DIAGNOSIS — M160 Bilateral primary osteoarthritis of hip: Secondary | ICD-10-CM

## 2023-08-24 DIAGNOSIS — M47816 Spondylosis without myelopathy or radiculopathy, lumbar region: Secondary | ICD-10-CM

## 2023-08-24 DIAGNOSIS — M4316 Spondylolisthesis, lumbar region: Secondary | ICD-10-CM

## 2023-08-24 DIAGNOSIS — M47817 Spondylosis without myelopathy or radiculopathy, lumbosacral region: Secondary | ICD-10-CM

## 2023-08-24 DIAGNOSIS — R937 Abnormal findings on diagnostic imaging of other parts of musculoskeletal system: Secondary | ICD-10-CM

## 2023-08-24 DIAGNOSIS — G8929 Other chronic pain: Secondary | ICD-10-CM

## 2023-08-24 DIAGNOSIS — M5459 Other low back pain: Secondary | ICD-10-CM

## 2023-08-24 DIAGNOSIS — Z91199 Patient's noncompliance with other medical treatment and regimen due to unspecified reason: Secondary | ICD-10-CM

## 2023-09-01 NOTE — Progress Notes (Addendum)
Telephone Visit- Progress Note: Referring Physician:  Sandrea Hughs, NP [address] [place],  [place] [address]  Primary Physician:  Sandrea Hughs, NP  This visit was performed via telephone.  Patient location: home Provider location: office  I spent a total of 15 minutes non-face-to-face activities for this visit on the date of this encounter including review of current clinical condition and response to treatment.    Patient has given verbal consent to this telephone visits and we reviewed the limitations of a telephone visit. Patient wishes to proceed.    Chief Complaint:  review cervical and lumbar MRI scans  History of Present Illness: Brenda Simmons is a 47 y.o. female has a history of migraines, chronic pain, HTN, DM.   Last seen by me on 05/26/23 for chronic back/leg pain and neck/bilateral arm pain. She has known moderate DDD/spondylosis C4-C7 with anterior spurring. MRI from last year shows mild spinal stenosis C3-C7 with multilevel foraminal stenosis that is severe at C5-C6.    She also has a right shoulder rotator cuff tear- she had painful limited ROM of her right shoulder at her last visit.   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 MRI from last year shows diffuse lumbar spondylosis with no compression noted.   She was sent to PT at her last visit- had initial evaluation on 06/28/23 with 4 additional visits (last on 08/09/23). She does not think that it helped.   Lance Bosch had right C7-T1 IL ESI on 06/16/23 and was referred to ortho for her right shoulder (did not see them). ESI only helped short term (for a couple hours).   Phone visit scheduled to review her cervical and lumbar MRI scans.   She has constant neck pain with radiation into both shoulders and down right > left arm into her hand (mostly into pointer finger). Neck pain > arm pain. She has weakness in right > left hand. She has numbness and tingling as well. She continues with pain  in her right shoulder. No dexterity or balance issues.  She has constant back pain with bilateral leg pain posterior to her feet that is worse with prolonged standing and walking. 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 She has weakness in both legs.   She is taking flexeril, motrin, and neurontin. Not sure these help much.    She smokes 1/2 ppd x 25 years.   She's only had short term injections with both cervical and lumbar injections.    Bowel/Bladder Dysfunction: none. She has some urinary urgency.    Conservative measures:  Physical therapy: none   Multimodal medical therapy including regular antiinflammatories: flexeril, neurontin, motrin,   Injections:  Right C7-T1 IL ESI 06/16/23 Right C7-T1 IL ESI 05/17/23 ( relief 1-2 days )  Diagnostic bilateral sacroiliac joint Blk x1  (no relief)  Diagnostic bilateral (L4-5, L5-S1) lumbar facet (L3-S1) MBB x1 ( no relief)    Past Surgery: no spinal surgery    [name] has no symptoms of cervical myelopathy. She takes a medication for vertigo.    Exam: No exam done as this was a telephone encounter.     Imaging: Cervical MRI scan dated 08/05/23:  FINDINGS: Alignment: No significant spondylolisthesis.   Vertebrae: Cervical vertebral body height is maintained. Degenerative endplate irregularity and edema at C5-C6 and C6-C7. Multilevel ventral osteophytes, most prominent at C5-C6 and C6-C7.   Cord: No signal abnormality identified within the cervical spinal  cord. Mild flattening of the ventral spinal cord at multiple levels as described below.   Posterior Fossa, vertebral arteries, paraspinal tissues: No abnormality identified within included portions of the posterior fossa. Flow voids preserved within the imaged cervical vertebral arteries. No paraspinal mass or collection.   Disc levels:   Multilevel disc degeneration, greatest at C5-C6 and C6-C7 (advanced at these levels).    C2-C3: No significant disc herniation or stenosis.   C3-C4: A broad-based central disc protrusion effaces the ventral thecal sac and mildly flattens the ventral aspect of the spinal cord. However, the dorsal CSF space is maintained within the spinal canal. No significant foraminal stenosis.   C4-C5: Disc bulge with bilateral uncovertebral hypertrophy. Superimposed broad-based central disc protrusion. The disc protrusion effaces the ventral thecal sac and mildly flattens the ventral aspect of the spinal cord (series 109, image 17). However, the dorsal CSF space is maintained within the spinal canal. Moderate bilateral neural foraminal narrowing.   C5-C6: Posterior disc osteophyte complex with bilateral disc osteophyte ridge/uncinate hypertrophy. Mild spinal canal stenosis. Severe bilateral neural foraminal narrowing.   C6-C7: Posterior disc osteophyte complex with bilateral disc osteophyte ridge/uncinate hypertrophy. Mild spinal canal stenosis. Moderate bilateral neural foraminal narrowing.   C7-T1: No significant disc herniation or stenosis.   IMPRESSION: 1. Cervical spondylosis as outlined within the body of the report. 2. At C5-C6, there is advanced disc degeneration with degenerative endplate edema. Posterior disc osteophyte complex with bilateral disc osteophyte ridge/uncinate hypertrophy. Mild spinal canal narrowing. Severe bilateral neural foraminal narrowing. 3. At C6-C7, there is advanced disc degeneration with degenerative endplate edema. Posterior disc osteophyte complex with bilateral disc osteophyte ridge/uncinate hypertrophy. Mild spinal canal narrowing. Moderate bilateral neural foraminal narrowing. 4. At C4-C5, a central disc protrusion effaces the ventral thecal sac and mildly flattens the ventral aspect of the spinal cord. However, the dorsal CSF space is maintained within the spinal canal. Multifactorial moderate bilateral neural foraminal narrowing. 5. At  C3-C4, a central disc protrusion effaces the ventral thecal sac and mildly flattens the ventral aspect of the spinal cord. However, the dorsal CSF space is maintained within the spinal canal.     Electronically Signed   By: Jackey Loge D.O.   On: 08/12/2023 19:54   Lumbar MRI dated 08/05/23:  FINDINGS: Segmentation: For the purposes of this dictation, the lowest well-formed intervertebral disc space is designated L5-S1. A rudimentary disc space is present at S1-S2. 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   Alignment: Slight grade 1 anterolisthesis at L4-L5. 3 mm grade 1 anterolisthesis at L5-S1.   Vertebrae: Lumbar vertebral body height is maintained. No significant marrow edema or focal worrisome marrow lesion.   Conus medullaris and cauda equina: Conus extends to the L2 level. No signal abnormality identified within the visualized distal spinal cord.   Paraspinal and other soft tissues: No acute finding within included portions of the abdomen/retroperitoneum. No paraspinal mass or collection.   Disc levels:   Mild disc degeneration at L5-S1. Intervertebral disc height and hydration are largely preserved at the remaining lumbar levels.   T12-L1: No significant disc herniation or stenosis.   L1-L2: No significant disc herniation or stenosis.   L2-L3: No significant disc herniation or stenosis.   L3-L4: Small disc bulge. Bilateral facet arthropathy (mild-to-moderate right, mild left). No significant spinal canal stenosis. Mild relative bilateral neural foraminal narrowing.   L4-L5: Slight grade 1 anterolisthesis. Small disc bulge. Bilateral facet arthropathy (  moderate right, mild left). No significant spinal canal stenosis. Mild relative bilateral neural foraminal narrowing.   L5-S1: 3 mm grade 1 anterolisthesis. Disc uncovering with slight disc bulge. Advanced facet arthropathy  bilaterally. No significant spinal canal or foraminal stenosis.   IMPRESSION: 1. For the purposes of this dictation, the lowest well-formed intervertebral disc space is designated L5-S1. A rudimentary disc space is present at S1-S2. 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 2. Lumbar spondylosis as outlined within the body of the report. 3. No significant spinal canal stenosis. 4. No more than mild neural foraminal narrowing. 5. Multilevel facet arthropathy, greatest on the right at L3-L4 (mild-to-moderate), on the right at L4-L5 (moderate) and bilaterally at L5-S1 (advanced). 6. Grade 1 anterolisthesis at L4-L5 and L5-S1.     Electronically Signed   By: Jackey Loge D.O.   On: 08/12/2023 20:08   I have personally reviewed the images and agree with the above interpretation.  Assessment and Plan: Ms. Cartright has constant neck pain with radiation into both shoulders and down right > left arm into her hand (mostly into pointer finger). Neck pain > arm pain. She has weakness in right > left hand. She has numbness and tingling as well.   She has moderate foraminal stenosis C4-C5 along with DDD C5-C6 with mild central stenosis and severe bilateral foraminal stenosis. Also with DDD C6-C7 with mild central stenosis and moderate bilateral foraminal stenosis.   She also has a right shoulder rotator cuff tear- she had painful limited ROM of her right shoulder at her last visit.    She also has constant back pain with bilateral leg pain posterior to her feet that is worse with prolonged standing and walking. 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 She has weakness in both legs.   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 Also with slip at L5-S1 with facet hypertrophy and DDD.   LBP is likely from  slips/lumbar spondylosis. Not sure leg pain is from L4-L5 as it is more in S1 distribution.    Treatment options discussed with patient and following plan made:    - Smoking cessation discussed and encouraged. She is working on it.  - She has done about 6 weeks of PT- was not discharged.  - No improvement with cervical or lumbar injections.  - Lumbar xrays with flexion/extension ordered. Will get prior to her follow up.  - Recommend she follow up with Dr. Katrinka Blazing to discuss any possible surgery options.  - Still recommend she see ortho for her right shoulder. Will resend referral.  - Of note, her last HgbA1c on 11/19/22 was 8.1. She sees PCP on Monday and will get it rechecked.   Drake Leach PA-C Neurosurgery

## 2023-09-02 ENCOUNTER — Encounter: Payer: Self-pay | Admitting: Orthopedic Surgery

## 2023-09-02 ENCOUNTER — Ambulatory Visit (INDEPENDENT_AMBULATORY_CARE_PROVIDER_SITE_OTHER): Payer: MEDICAID | Admitting: Orthopedic Surgery

## 2023-09-02 DIAGNOSIS — M5030 Other cervical disc degeneration, unspecified cervical region: Secondary | ICD-10-CM

## 2023-09-02 DIAGNOSIS — M25511 Pain in right shoulder: Secondary | ICD-10-CM

## 2023-09-02 DIAGNOSIS — M4316 Spondylolisthesis, lumbar region: Secondary | ICD-10-CM | POA: Diagnosis not present

## 2023-09-02 DIAGNOSIS — M4726 Other spondylosis with radiculopathy, lumbar region: Secondary | ICD-10-CM | POA: Diagnosis not present

## 2023-09-02 DIAGNOSIS — M4722 Other spondylosis with radiculopathy, cervical region: Secondary | ICD-10-CM | POA: Diagnosis not present

## 2023-09-02 DIAGNOSIS — M5416 Radiculopathy, lumbar region: Secondary | ICD-10-CM

## 2023-09-02 DIAGNOSIS — M47816 Spondylosis without myelopathy or radiculopathy, lumbar region: Secondary | ICD-10-CM

## 2023-09-02 DIAGNOSIS — M47812 Spondylosis without myelopathy or radiculopathy, cervical region: Secondary | ICD-10-CM

## 2023-09-02 DIAGNOSIS — M5412 Radiculopathy, cervical region: Secondary | ICD-10-CM

## 2023-09-02 NOTE — Addendum Note (Signed)
Addended byDrake Leach on: 09/02/2023 12:35 PM   Modules accepted: Orders

## 2023-09-07 ENCOUNTER — Encounter: Payer: Self-pay | Admitting: Pain Medicine

## 2023-09-07 ENCOUNTER — Ambulatory Visit: Payer: MEDICAID | Attending: Pain Medicine | Admitting: Pain Medicine

## 2023-09-07 VITALS — BP 111/72 | HR 79 | Temp 97.3°F | Resp 16 | Ht 64.0 in | Wt 220.0 lb

## 2023-09-07 DIAGNOSIS — M5459 Other low back pain: Secondary | ICD-10-CM | POA: Insufficient documentation

## 2023-09-07 DIAGNOSIS — R937 Abnormal findings on diagnostic imaging of other parts of musculoskeletal system: Secondary | ICD-10-CM | POA: Insufficient documentation

## 2023-09-07 DIAGNOSIS — M542 Cervicalgia: Secondary | ICD-10-CM | POA: Diagnosis present

## 2023-09-07 DIAGNOSIS — M545 Low back pain, unspecified: Secondary | ICD-10-CM | POA: Diagnosis present

## 2023-09-07 DIAGNOSIS — M25551 Pain in right hip: Secondary | ICD-10-CM | POA: Diagnosis present

## 2023-09-07 DIAGNOSIS — M47817 Spondylosis without myelopathy or radiculopathy, lumbosacral region: Secondary | ICD-10-CM | POA: Diagnosis present

## 2023-09-07 DIAGNOSIS — M47816 Spondylosis without myelopathy or radiculopathy, lumbar region: Secondary | ICD-10-CM | POA: Insufficient documentation

## 2023-09-07 DIAGNOSIS — M25552 Pain in left hip: Secondary | ICD-10-CM | POA: Insufficient documentation

## 2023-09-07 DIAGNOSIS — M4316 Spondylolisthesis, lumbar region: Secondary | ICD-10-CM | POA: Diagnosis present

## 2023-09-07 DIAGNOSIS — M160 Bilateral primary osteoarthritis of hip: Secondary | ICD-10-CM | POA: Diagnosis present

## 2023-09-07 DIAGNOSIS — G8929 Other chronic pain: Secondary | ICD-10-CM | POA: Diagnosis present

## 2023-09-07 DIAGNOSIS — M461 Sacroiliitis, not elsewhere classified: Secondary | ICD-10-CM | POA: Insufficient documentation

## 2023-09-07 NOTE — Patient Instructions (Signed)
 ______________________________________________________________________    OTC Supplements:   The following is a list of over-the-counter (OTC) supplements that have been found to have [employer]) studies suggesting that they may be of some benefits when used in moderation in some chronic pain-related conditions.  NOTE:  Always consult with your primary care provider and/or pharmacist before taking any OTC medications to make sure they will not interact with your current medications. Always use manufacturer's recommended dosage.  Supplement Possible benefit May be of benefit in treatment of   Turmeric/curcumin anti-inflammatory Joint and muscle aches and pain.  Glucosamine/chondroitin (triple strength) may slow loss of articular cartilage Joint pain.  Vitamin D-3* may suppress release of chemicals associated with inflammation. Increases tolerance to pain. Joint and muscle aches and pain.   Moringa(+) anti-inflammatory with mild analgesic effects Joint and muscle aches and pain.  Melatonin(+) Helps reset sleep cycle. Insomnia.  Vitamin B-12* may help keep nerves and blood cells healthy as well as maintaining function of nervous system Nerve pain (Burning pain)  Alpha-Lipoic-Acid (ALA)* antioxidant that may help with nerve health, pain, and blocking the activation of some inflammatory chemicals Diabetic neuropathy and metabolic syndrome  superoxide dismutase (SOD)** Currently being reviewed.   Tiger Balm Currently being reviewed.   hydrolyzed collagen peptides* Currently being reviewed.  Collagen supplementation may increases bone strength, density, and mass; may improve joint stiffness/mobility, and functionality; and may reduce joint pain. Possible chondroprotective effects. May help with protection of joint health.   Methylsulfonylmethane (MSM)* Currently being reviewed.   CBD(+) Currently being reviewed.   Delta-8 THC(+) Currently being reviewed.   *  Generally  Recognized As Safe (GRAS) approved substance.-FDA ([employer]) ** "Possibly Safe", but not considered Generally Recognized As Safe (Not GRAS) by the [place] (FDA) as a food additive. (+) Not considered Generally Recognized As Safe (Not GRAS) by the [employer] (FDA) as a food additive.  ______________________________________________________________________

## 2023-09-07 NOTE — Progress Notes (Signed)
PROVIDER NOTE: Information contained herein reflects review and annotations entered in association with encounter. Interpretation of such information and data should be left to medically-trained personnel. Information provided to patient can be located elsewhere in the medical record under "Patient Instructions". Document created using STT-dictation technology, any transcriptional errors that may result from process are unintentional.    Patient: Brenda Simmons  Service Category: E/M  Provider: Oswaldo Done, MD  DOB: Feb 03, 1976  DOS: 09/07/2023  Referring Provider: Sandrea Hughs, NP  MRN: [id number]  Specialty: Interventional Pain Management  PCP: Sandrea Hughs, NP  Type: Established Patient  Setting: Ambulatory outpatient    Location: Office  Delivery: Face-to-face     HPI  Ms. KADESHIA THOMURE, a 47 y.o. year old female, is here today because of her Chronic bilateral low back pain without sciatica [M54.50, G89.29]. Ms. Kruczek's primary complain today is Back Pain (lower)  Pertinent problems: Ms. Kieper has Disorder of rotator cuff; Chronic neck pain (2ry area of Pain) (Bilateral) (R>L); Chronic shoulder pain (Right); Migraine headache without aura; Migrainous dizziness; Right-sided low back pain with right-sided sciatica; Bilateral lower extremity pain; Chronic tension-type headache, not intractable; Herniated cervical disc; Osteoarthritis of spine with radiculopathy, cervical region; Right rotator cuff tear; Shoulder tendinitis, right; Abnormal CT scan, cervical spine (01/24/2022); DDD (degenerative disc disease), cervical (C5-C7); Chronic pain syndrome; Chronic low back pain (1ry area of Pain) (Bilateral) (R>L) w/o sciatica; Chronic hip pain (3ry area of Pain) (Bilateral) (R>L); Chronic knee pain (4th area of Pain) (Bilateral) (R>L); Chronic lower extremity pain (5th area of Pain) (Bilateral) (L>R); History of ankle surgery old (2006) (Left); Numbness of lower extremity (Intermittent)  (Bilateral); DDD (degenerative disc disease), lumbosacral; Osteoarthritis of knee (Right); Osteoarthritis of hips (Bilateral); Osteoarthritis of sacroiliac joints (Bilateral) (HCC); Cervical facet hypertrophy (Multilevel) (Bilateral); Cervical foraminal stenosis  (Right: C3-4) (Bilateral: C4-5, C5-6) (Left: C6-7); Grade 1 (5 mm) Anterolisthesis of lumbar spine (L4/L5 & L5/S1); Lumbar facet joint arthropathy (L4-5); Lumbosacral facet joint syndrome; Spondylosis without myelopathy or radiculopathy, lumbosacral region; Lumbar facet joint pain; Chronic sacroiliac joint pain (Bilateral) (R>L); Disorder of sacroiliac joints (Bilateral); Somatic dysfunction of sacroiliac joints (Bilateral); Other spondylosis, sacral and sacrococcygeal region; Abnormal MRI, lumbar spine (08/12/2023); Abnormal MRI, cervical spine (08/12/2023); Chronic upper extremity pain (Left); Cervical radiculitis; Acute neck pain; and Chronic upper extremity pain (Right) on their pertinent problem list. Pain Assessment: Severity of Chronic pain is reported as a 9 /10. Location: Back Right, Left, Lower/hips/buttocks around to groin then back of legs to bottom of feet affects great toes, alternates sides, right now left is worse. Onset: More than a month ago. Quality: Aching, Constant, Burning, Tender, Stabbing, Spasm, Discomfort, Jabbing, Shooting, Tiring. Timing: Constant. Modifying factor(s): heat, showers. Vitals:  height is 5\' 4"  (1.626 m) and weight is 220 lb (99.8 kg). Her temperature is 97.3 F (36.3 C) (abnormal). Her blood pressure is 111/72 and her pulse is 79. Her respiration is 16 and oxygen saturation is 99%.  BMI: Estimated body mass index is 37.76 kg/m as calculated from the following:   Height as of this encounter: 5\' 4"  (1.626 m).   Weight as of this encounter: 220 lb (99.8 kg). Last encounter: 08/24/2023. Last procedure: 06/16/2023.  Reason for encounter: evaluation of worsening, or previously known (established) problem.  The  patient requested an appointment for evaluation of her low back and hip pain on 08/17/2023 and 08/24/2023 but she was "NO-SHOW" to both of those appointments.  On 08/24/2023 the patient was sent a warning  letter letting her know of our policy to automatically discharge patient is upon their third "NO-SHOW".  She again called and requested this appointment (09/07/2023) for evaluation of her low back and hip pain.  Discussed the use of AI scribe software for clinical note transcription with the patient, who gave verbal consent to proceed.  History of Present Illness   The patient, with a history of diabetes, presented with chronic pain in the back, hips, and neck. The pain was described as equally severe in the neck and lower back, radiating down both legs into the feet. The patient also reported experiencing headaches, which were sometimes unilateral and at other times bilateral, originating from the back of the head and radiating to the front.  The patient had previously undergone physical therapy and injections for the pain, but these interventions were unsuccessful. An MRI had been conducted, revealing multilevel lumbar facet arthropathy with anterolisthesis at the L3-4, L4-5, and L5-S1 levels.  The patient also reported experiencing pain in the hips, which had been investigated with x-rays. The x-rays revealed mild bilateral femoral acetabular osteoarthritis, which was attributed to age and weight. The patient also reported pain in the knees, with the left knee x-ray appearing normal, but the right knee showing minimal posterior patellar degeneration osteophytosis.  In addition to the lower body pain, the patient reported chronic neck pain. An MRI of the cervical spine revealed severe bilateral foraminal stenosis at the C5-6 level, as well as a disc protrusion at the C4-5 level, which appeared to be exerting pressure on the spinal cord.  The patient's pain was reported to be constant and equally  severe in the neck and lower back. The pain in the legs was described as extending down both legs into the feet, affecting the arch, top, and bottom of the feet. The patient also reported pain in the big toe. The calf muscles were described as sore.  The patient's diabetes was identified as a significant factor in their overall health and potential treatment options.     (08/12/2023) LUMBAR MRI FINDINGS: Segmentation: For the purposes of this dictation, the lowest well-formed intervertebral disc space is designated L5-S1. A rudimentary disc space is present at S1-S2. 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 Alignment: Slight grade 1 anterolisthesis at L4-5. 3 mm grade 1 anterolisthesis at L5-S1. Conus medullaris and cauda equina: Conus extends to the L2 level.    DISC LEVELS: Mild disc degeneration at L5-S1.  L3-4: Small disc bulge. Bilateral facet arthropathy (mild-to-moderate right, mild left). Mild relative bilateral neural foraminal narrowing.   L4-5: Slight grade 1 anterolisthesis. Small disc bulge. Bilateral facet arthropathy (moderate right, mild left). Mild relative bilateral neural foraminal narrowing.   L5-S1: 3 mm grade 1 anterolisthesis. Disc uncovering with slight disc bulge. Advanced facet arthropathy bilaterally.    IMPRESSION: 1. For the purposes of this dictation, the lowest well-formed intervertebral disc space is designated L5-S1. A rudimentary disc space is present at S1-2. 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 2. Lumbar spondylosis as outlined within the body of the report. 3. No significant spinal canal stenosis. 4. No more than mild neural foraminal narrowing. 5. Multilevel facet arthropathy, greatest on the right at L3-4 (mild-to-moderate), on the right at L4-5 (moderate) and bilaterally at L5-S1  (advanced). 6. Grade 1 anterolisthesis at L4-5 and L5-S1.  In addition, the patient had bilateral diagnostic x-rays  of her hips on 10/18/2022. LEFT HIP IMPRESSION: 1. Mild bilateral femoroacetabular osteoarthritis. 2. Mild bilateral sacroiliac osteoarthritis  RIGHT HIP IMPRESSION: 1. Mild bilateral femoroacetabular osteoarthritis. 2. Mild bilateral sacroiliac osteoarthritis.  Diagnostic interventional therapies have included: Diagnostic bilateral SI joint Blk x1 (03/10/2023) (80/0/0/0)  Diagnostic bilateral (L4-5, L5-S1) lumbar facet (L3-S1) MBB x1 (12/30/2022) (100/80/0/0)   Pharmacotherapy Assessment  Analgesic: None MME/day: 0 mg/day   Monitoring: [place] PMP: PDMP not reviewed this encounter.       Pharmacotherapy: No side-effects or adverse reactions reported. Compliance: No problems identified. Effectiveness: Clinically acceptable.  No notes on file  No results found for: "CBDTHCR" No results found for: "D8THCCBX" No results found for: "D9THCCBX"  UDS:  Summary  Date Value Ref Range Status  10/18/2022 Note  Final    Comment:    ==================================================================== Compliance Drug Analysis, Ur ==================================================================== Test                             Result       Flag       Units  Drug Present and Declared for Prescription Verification   Gabapentin                     PRESENT      EXPECTED   Salicylate                     PRESENT      EXPECTED   Propranolol                    PRESENT      EXPECTED  Drug Present not Declared for Prescription Verification   Naproxen                       PRESENT      UNEXPECTED  Drug Absent but Declared for Prescription Verification   Fluoxetine                     Not Detected UNEXPECTED   Quetiapine                     Not Detected UNEXPECTED   Ibuprofen                      Not Detected UNEXPECTED    Ibuprofen, as indicated in the declared medication list,  is not    always detected even when used as directed.  ==================================================================== Test                      Result    Flag   Units      Ref Range   Creatinine              139              mg/dL      >=84 ==================================================================== Declared Medications:  The flagging and interpretation on this report are based on the  following declared medications.  Unexpected results may arise from  inaccuracies in the declared medications.   **Note: The testing scope of this panel includes these medications:   Fluoxetine (Prozac)  Gabapentin (Neurontin)  Propranolol  Quetiapine (Seroquel)   **Note: The testing scope of this panel does not include small to  moderate amounts of these reported medications:   Aspirin  Ibuprofen (Advil)  Salicylate (Salicylamide)   **  Note: The testing scope of this panel does not include the  following reported medications:   Albuterol  Caffeine  Cetirizine (Zyrtec)  Fluticasone (Flonase)  Metformin (Janumet)  Montelukast  Multivitamin  Omeprazole  Ondansetron  Sitagliptin (Janumet) ==================================================================== For clinical consultation, please call [phone]. ====================================================================       ROS  Constitutional: Denies any fever or chills Gastrointestinal: No reported hemesis, hematochezia, vomiting, or acute GI distress Musculoskeletal: Denies any acute onset joint swelling, redness, loss of ROM, or weakness Neurological: No reported episodes of acute onset apraxia, aphasia, dysarthria, agnosia, amnesia, paralysis, loss of coordination, or loss of consciousness  Medication Review  Aspirin-Salicylamide-Caffeine, Capsaicin, QUEtiapine, Rightest GL300 Lancets, SUMAtriptan, Vitamin D (Ergocalciferol), albuterol, atorvastatin, cetirizine, cyclobenzaprine, empagliflozin, escitalopram,  fluticasone, gabapentin, glucose blood, ibuprofen, lisinopril, meclizine, medroxyPROGESTERone, methylPREDNISolone, nortriptyline, omeprazole, ondansetron, propranolol ER, sitaGLIPtin-metformin, and traZODone  History Review  Allergy: Ms. Baudoin has No Known Allergies. Drug: Ms. Delcarmen  reports no history of drug use. Alcohol:  reports current alcohol use. Tobacco:  reports that she has been smoking cigarettes. She has a 3.8 pack-year smoking history. She has never used smokeless tobacco. Social: Ms. Sterkel  reports that she has been smoking cigarettes. She has a 3.8 pack-year smoking history. She has never used smokeless tobacco. She reports current alcohol use. She reports that she does not use drugs. Medical:  has a past medical history of Anxiety, Breast mass (x 3 mos), Bulging lumbar disc, Chronic bronchitis (HCC), Depression, Diabetes mellitus without complication (HCC), Hypertension, Migraines, Partial tear of rotator cuff, and Pre-diabetes. Surgical: Ms. Basel  has a past surgical history that includes Other surgical history (Left, ankle surgery); Salpingectomy (Bilateral); Ankle surgery (Left); Breast cyst aspiration (Right, age 55); and Tubal ligation (Bilateral). Family: family history includes Cancer in her maternal grandmother and paternal grandmother; Depression in her brother and mother; Diabetes in her mother; Heart failure in her mother; Hypertension in her father, mother, and sister.  Laboratory Chemistry Profile   Renal Lab Results  Component Value Date   BUN 10 03/16/2023   CREATININE 0.70 06/09/2023   BCR 8 (L) 10/18/2022   GFRAA >60 07/28/2016   GFRNONAA >60 03/16/2023    Hepatic Lab Results  Component Value Date   AST 14 10/18/2022   ALT 11 (L) 07/28/2016   ALBUMIN 3.8 (L) 10/18/2022   ALKPHOS 114 10/18/2022   LIPASE 103 09/17/2014    Electrolytes Lab Results  Component Value Date   NA 135 03/16/2023   K 4.1 03/16/2023   CL 104 03/16/2023   CALCIUM 8.4  (L) 03/16/2023   MG 1.7 10/18/2022    Bone Lab Results  Component Value Date   VD25OH 23.0 (L) 12/04/2015   25OHVITD1 9.2 (L) 10/18/2022   25OHVITD2 <1.0 10/18/2022   25OHVITD3 9.1 10/18/2022    Inflammation (CRP: Acute Phase) (ESR: Chronic Phase) Lab Results  Component Value Date   CRP 14 (H) 10/18/2022   ESRSEDRATE 7 10/18/2022         Note: Above Lab results reviewed.  Recent Imaging Review  MR LUMBAR SPINE WO CONTRAST CLINICAL DATA:  Provided history: Lumbar spondylosis. Spondylolisthesis of lumbar region. Lumbar radiculopathy. Lumbar radiculopathy, symptoms persist with greater than 6 weeks treatment.  EXAM: MRI LUMBAR SPINE WITHOUT CONTRAST  TECHNIQUE: Multiplanar, multisequence MR imaging of the lumbar spine was performed. No intravenous contrast was administered.  COMPARISON:  Lumbar spine radiographs 10/18/2022. CT abdomen/pelvis 03/14/2022.  FINDINGS: Segmentation: For the purposes of this dictation, the lowest well-formed intervertebral disc space is designated  L5-S1. A rudimentary disc space is present at S1-S2. 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  Alignment: Slight grade 1 anterolisthesis at L4-L5. 3 mm grade 1 anterolisthesis at L5-S1.  Vertebrae: Lumbar vertebral body height is maintained. No significant marrow edema or focal worrisome marrow lesion.  Conus medullaris and cauda equina: Conus extends to the L2 level. No signal abnormality identified within the visualized distal spinal cord.  Paraspinal and other soft tissues: No acute finding within included portions of the abdomen/retroperitoneum. No paraspinal mass or collection.  Disc levels:  Mild disc degeneration at L5-S1. Intervertebral disc height and hydration are largely preserved at the remaining lumbar levels.  T12-L1: No significant disc herniation or stenosis.  L1-L2: No significant disc  herniation or stenosis.  L2-L3: No significant disc herniation or stenosis.  L3-L4: Small disc bulge. Bilateral facet arthropathy (mild-to-moderate right, mild left). No significant spinal canal stenosis. Mild relative bilateral neural foraminal narrowing.  L4-L5: Slight grade 1 anterolisthesis. Small disc bulge. Bilateral facet arthropathy (moderate right, mild left). No significant spinal canal stenosis. Mild relative bilateral neural foraminal narrowing.  L5-S1: 3 mm grade 1 anterolisthesis. Disc uncovering with slight disc bulge. Advanced facet arthropathy bilaterally. No significant spinal canal or foraminal stenosis.  IMPRESSION: 1. For the purposes of this dictation, the lowest well-formed intervertebral disc space is designated L5-S1. A rudimentary disc space is present at S1-S2. 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 2. Lumbar spondylosis as outlined within the body of the report. 3. No significant spinal canal stenosis. 4. No more than mild neural foraminal narrowing. 5. Multilevel facet arthropathy, greatest on the right at L3-L4 (mild-to-moderate), on the right at L4-L5 (moderate) and bilaterally at L5-S1 (advanced). 6. Grade 1 anterolisthesis at L4-L5 and L5-S1.  Electronically Signed   By: Jackey Loge D.O.   On: 08/12/2023 20:08 MR CERVICAL SPINE WO CONTRAST CLINICAL DATA:  Provided history: Cervical spondylosis. Degenerative disc disease, cervical. Cervical radiculopathy. Neck pain, chronic, degenerative changes on x-ray. Additional history provided by the scanning technologist: the patient reports bilateral neck and shoulder pain.  EXAM: MRI CERVICAL SPINE WITHOUT CONTRAST  TECHNIQUE: Multiplanar, multisequence MR imaging of the cervical spine was performed. No intravenous contrast was administered.  COMPARISON:  Cervical spine radiographs  10/18/2022.  FINDINGS: Alignment: No significant spondylolisthesis.  Vertebrae: Cervical vertebral body height is maintained. Degenerative endplate irregularity and edema at C5-C6 and C6-C7. Multilevel ventral osteophytes, most prominent at C5-C6 and C6-C7.  Cord: No signal abnormality identified within the cervical spinal cord. Mild flattening of the ventral spinal cord at multiple levels as described below.  Posterior Fossa, vertebral arteries, paraspinal tissues: No abnormality identified within included portions of the posterior fossa. Flow voids preserved within the imaged cervical vertebral arteries. No paraspinal mass or collection.  Disc levels:  Multilevel disc degeneration, greatest at C5-C6 and C6-C7 (advanced at these levels).  C2-C3: No significant disc herniation or stenosis.  C3-C4: A broad-based central disc protrusion effaces the ventral thecal sac and mildly flattens the ventral aspect of the spinal cord. However, the dorsal CSF space is maintained within the spinal canal. No significant foraminal stenosis.  C4-C5: Disc bulge with bilateral uncovertebral hypertrophy. Superimposed broad-based central disc protrusion. The disc protrusion effaces the ventral thecal sac and mildly flattens the ventral aspect of the spinal cord (series 109, image 17). However, the dorsal CSF space is maintained within the  spinal canal. Moderate bilateral neural foraminal narrowing.  C5-C6: Posterior disc osteophyte complex with bilateral disc osteophyte ridge/uncinate hypertrophy. Mild spinal canal stenosis. Severe bilateral neural foraminal narrowing.  C6-C7: Posterior disc osteophyte complex with bilateral disc osteophyte ridge/uncinate hypertrophy. Mild spinal canal stenosis. Moderate bilateral neural foraminal narrowing.  C7-T1: No significant disc herniation or stenosis.  IMPRESSION: 1. Cervical spondylosis as outlined within the body of the report. 2. At C5-C6,  there is advanced disc degeneration with degenerative endplate edema. Posterior disc osteophyte complex with bilateral disc osteophyte ridge/uncinate hypertrophy. Mild spinal canal narrowing. Severe bilateral neural foraminal narrowing. 3. At C6-C7, there is advanced disc degeneration with degenerative endplate edema. Posterior disc osteophyte complex with bilateral disc osteophyte ridge/uncinate hypertrophy. Mild spinal canal narrowing. Moderate bilateral neural foraminal narrowing. 4. At C4-C5, a central disc protrusion effaces the ventral thecal sac and mildly flattens the ventral aspect of the spinal cord. However, the dorsal CSF space is maintained within the spinal canal. Multifactorial moderate bilateral neural foraminal narrowing. 5. At C3-C4, a central disc protrusion effaces the ventral thecal sac and mildly flattens the ventral aspect of the spinal cord. However, the dorsal CSF space is maintained within the spinal canal.  Electronically Signed   By: Jackey Loge D.O.   On: 08/12/2023 19:54 MR BRAIN/IAC W WO CONTRAST CLINICAL DATA:  Provided history: Dizziness. Additional history provided by the scanning technologist: The patient reports headaches, right-sided hearing loss.  EXAM: MRI HEAD WITHOUT AND WITH CONTRAST  TECHNIQUE: Multiplanar, multiecho pulse sequences of the brain and surrounding structures were obtained without and with intravenous contrast.  CONTRAST:  10 mL Vueway intravenous contrast.  COMPARISON:  Report from head CT 05/23/2004 (images unavailable).  FINDINGS: Brain:  No age advanced or lobar predominant parenchymal atrophy.  3 mm hypoenhancing or nonenhancing focus within the posterior aspect of the pituitary gland (for instance as seen on series 21, image 9) (series 22, images 47-49).  No cortical encephalomalacia is identified. No significant cerebral white matter disease.  No cerebellopontine angle or internal auditory canal mass  is demonstrated. Unremarkable appearance of the 7th and 8th cranial nerves bilaterally.  There is no acute infarct.  No chronic intracranial blood products.  No extra-axial fluid collection.  No midline shift.  Vascular: Maintained flow voids within the proximal large arterial vessels.  Skull and upper cervical spine: No focal worrisome marrow lesion. Incompletely assessed cervical spondylosis.  Sinuses/Orbits: No mass or acute finding within the imaged orbits. Minimal mucosal thickening within a posterior right ethmoid air cell.  Impression #3 will be called to the ordering clinician or representative by the Radiologist Assistant, and communication documented in the PACS or [employer].  IMPRESSION: 1. No evidence of an acute or recent subacute infarction. 2. No cerebellopontine angle or internal auditory canal mass. 3. 3 mm hypoenhancing or non-enhancing focus within the posterior aspect of the pituitary gland, which may reflect a cyst or a pituitary microadenoma. No further imaging evaluation or imaging follow-up is necessary. Correlate for a history of pituitary hypersecretion. This follows ACR consensus guidelines: Management of Incidental Pituitary Findings on CT, MRI and F18-FDG PET: A White Paper of the ACR Incidental Findings Committee. J Am Coll Radiol 2018; 15: 966-72. 4. Otherwise unremarkable MRI appearance of the brain. 5. Minor right ethmoid sinus mucosal thickening  Electronically Signed   By: Jackey Loge D.O.   On: 08/12/2023 19:37 Note: Reviewed        Physical Exam  General appearance: Well nourished, well developed, and well  hydrated. In no apparent acute distress Mental status: Alert, oriented x 3 (person, place, & time)       Respiratory: No evidence of acute respiratory distress Eyes: PERLA Vitals: BP 111/72   Pulse 79   Temp (!) 97.3 F (36.3 C)   Resp 16   Ht 5\' 4"  (1.626 m)   Wt 220 lb (99.8 kg)   SpO2 99%   BMI 37.76 kg/m   BMI: Estimated body mass index is 37.76 kg/m as calculated from the following:   Height as of this encounter: 5\' 4"  (1.626 m).   Weight as of this encounter: 220 lb (99.8 kg). Ideal: Ideal body weight: 54.7 kg (120 lb 9.5 oz) Adjusted ideal body weight: 72.7 kg (160 lb 5.7 oz)  Assessment   Diagnosis Status  1. Chronic low back pain (1ry area of Pain) (Bilateral) (R>L) w/o sciatica   2. Chronic neck pain (2ry area of Pain) (Bilateral) (R>L)   3. Chronic hip pain (3ry area of Pain) (Bilateral) (R>L)   4. Grade 1 (5 mm) Anterolisthesis of lumbar spine (L4/L5 & L5/S1)   5. Lumbar facet joint arthropathy (L4-5)   6. Lumbar facet joint pain   7. Lumbosacral facet joint syndrome   8. Osteoarthritis of sacroiliac joints (Bilateral) (HCC)   9. Osteoarthritis of hips (Bilateral)   10. Abnormal MRI, lumbar spine (08/12/2023)   11. Abnormal MRI, cervical spine (08/12/2023)    Persistent Persistent Persistent   Updated Problems: No problems updated.  Plan of Care  Problem-specific:  Assessment and Plan    Chronic Back and Neck Pain Chronic cervical and lumbar pain with radiculopathy is evident, with MRI showing multilevel lumbar facet arthropathy and anterolisthesis at L3-4, L4-5, and L5-S1, alongside severe bilateral foraminal stenosis at C5-6. The pain likely stems from facet joint arthritis and nerve compression, with cervical issues potentially contributing to both neck and lower back pain. We will address cervical spine issues first to potentially alleviate both neck and lower back pain, avoiding steroid injections due to their impact on blood sugar levels. Consideration for surgical intervention for the cervical spine will follow once A1c is controlled.  Osteoarthritis Mild bilateral femoral acetabular osteoarthritis, mild sacroiliac joint osteoarthritis, and minimal posterior patellar degeneration osteophytosis in the right knee have been identified, consistent with age-related  degenerative changes contributing to hip and knee pain. We recommend non-surgical management for osteoarthritis and will provide copies of imaging results for further consultation.  Diabetes Mellitus Type 2 With an elevated A1c of 8.6, reduction to 7.5 is required for surgical eligibility. The poorly controlled diabetes complicates overall health and potential surgical interventions. We will emphasize dietary changes, weight loss, and regular monitoring, incorporating anti-inflammatory supplements that do not affect blood sugar, including cinnamon and inulin for blood sugar management. Sugars and carbohydrates will be eliminated from the diet.  General Health Maintenance Education on lifestyle modifications to manage diabetes and reduce pain has been provided, highlighting the dietary impact on pain and overall health. We advise avoiding carbohydrates and sugars to manage diabetes and reduce pain, adopting a diet rich in proteins and vegetables, and monitoring for pain exacerbation related to dietary choices. A list of over-the-counter anti-inflammatory supplements will be provided.  Follow-up Efforts will focus on reducing A1c to below 7.5. They should contact us if pain becomes unmanageable for potential steroid injections, with reassessment for surgical intervention once A1c is controlled.    Ms. SKYELYNN REHMAN has a current medication list which includes the following long-term medication(s): albuterol,  depo-provera, escitalopram, fluticasone, lisinopril, omeprazole, propranolol er, sitagliptin-metformin, sumatriptan, and gabapentin.  Pharmacotherapy (Medications Ordered): No orders of the defined types were placed in this encounter.  Orders:  No orders of the defined types were placed in this encounter.  Follow-up plan:   Return if symptoms worsen or fail to improve.      Interventional Therapies  Risk Factors  Considerations:  MO  [id number]  HTN  Anxiety   Planned  Pending:       Under consideration:   Therapeutic cervical spine decompressive surgery by neurosurgery   Completed:   Therapeutic right cervical ESI x2 (06/16/2023) (100/100 x5 hrs/50% x days/0)  Diagnostic bilateral sacroiliac joint Blk x1 (03/10/2023) (80/0/0/0)  Diagnostic bilateral (L4-5, L5-S1) lumbar facet (L3-S1) MBB x1 (12/30/2022) (100/80/0/0)    Completed by other providers:   None at this time   Therapeutic  Palliative (PRN) options:   None established      Recent Visits Date Type Provider Dept  07/05/23 Office Visit Delano Metz, MD [facility]  06/16/23 Procedure visit Delano Metz, MD [facility]  Showing recent visits within past 90 days and meeting all other requirements Today's Visits Date Type Provider Dept  09/07/23 Office Visit Delano Metz, MD [facility]  Showing today's visits and meeting all other requirements Future Appointments No visits were found meeting these conditions. Showing future appointments within next 90 days and meeting all other requirements  I discussed the assessment and treatment plan with the patient. The patient was provided an opportunity to ask questions and all were answered. The patient agreed with the plan and demonstrated an understanding of the instructions.  Patient advised to call back or seek an in-person evaluation if the symptoms or condition worsens.  Duration of encounter: 47 minutes.  Total time on encounter, as per AMA guidelines included both the face-to-face and non-face-to-face time personally spent by the physician and/or other qualified health care professional(s) on the day of the encounter (includes time in activities that require the physician or other qualified health care professional and does not include time in activities normally performed by clinical staff). Physician's time may include the following activities when performed: Preparing to see the patient (e.g.,  pre-charting review of records, searching for previously ordered imaging, lab work, and nerve conduction tests) Review of prior analgesic pharmacotherapies. Reviewing PMP Interpreting ordered tests (e.g., lab work, imaging, nerve conduction tests) Performing post-procedure evaluations, including interpretation of diagnostic procedures Obtaining and/or reviewing separately obtained history Performing a medically appropriate examination and/or evaluation Counseling and educating the patient/family/caregiver Ordering medications, tests, or procedures Referring and communicating with other health care professionals (when not separately reported) Documenting clinical information in the electronic or other health record Independently interpreting results (not separately reported) and communicating results to the patient/ family/caregiver Care coordination (not separately reported)  Note by: Oswaldo Done, MD Date: 09/07/2023; Time: 1:27 PM

## 2023-09-09 NOTE — Progress Notes (Deleted)
Referring Physician:  Sandrea Hughs, NP [address] [place],  [place] [address]  Primary Physician:  Sandrea Hughs, NP  History of Present Illness: 09/09/2023 Brenda Simmons is here today with a chief complaint of ***   History of Present Illness: 09/02/2023 note from Drake Leach, PA-C ADILYNNE Simmons is a 47 y.o. female has a history of migraines, chronic pain, HTN, DM.    Last seen by me on 05/26/23 for chronic back/leg pain and neck/bilateral arm pain. She has known moderate DDD/spondylosis C4-C7 with anterior spurring. MRI from last year shows mild spinal stenosis C3-C7 with multilevel foraminal stenosis that is severe at C5-C6.    She also has a right shoulder rotator cuff tear- she had painful limited ROM of her right shoulder at her last visit.    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 MRI from last year shows diffuse lumbar spondylosis with no compression noted.    She was sent to PT at her last visit- had initial evaluation on 06/28/23 with 4 additional visits (last on 08/09/23). She does not think that it helped.    Lance Bosch had right C7-T1 IL ESI on 06/16/23 and was referred to ortho for her right shoulder (did not see them). ESI only helped short term (for a couple hours).    Phone visit scheduled to review her cervical and lumbar MRI scans.    She has constant neck pain with radiation into both shoulders and down right > left arm into her hand (mostly into pointer finger). Neck pain > arm pain. She has weakness in right > left hand. She has numbness and tingling as well. She continues with pain in her right shoulder. No dexterity or balance issues.   She has constant back pain with bilateral leg pain posterior to her feet that is worse with prolonged standing and walking. 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 She has weakness in both legs.    She is taking flexeril, motrin, and neurontin. Not sure these  help much.    She smokes 1/2 ppd x 25 years.    She's only had short term injections with both cervical and lumbar injections.    Bowel/Bladder Dysfunction: none. She has some urinary urgency.    Conservative measures:  Physical therapy: none   Multimodal medical therapy including regular antiinflammatories: flexeril, neurontin, motrin,   Injections:  Right C7-T1 IL ESI 06/16/23 Right C7-T1 IL ESI 05/17/23 ( relief 1-2 days )  Diagnostic bilateral sacroiliac joint Blk x1  (no relief)  Diagnostic bilateral (L4-5, L5-S1) lumbar facet (L3-S1) MBB x1 ( no relief)    Past Surgery: no spinal surgery    Brenda Simmons has no symptoms of cervical myelopathy. She takes a medication for vertigo.      KORRAH Simmons has ***no symptoms of cervical myelopathy.  The symptoms are causing a significant impact on the patient's life.   I have utilized the care everywhere function in epic to review the outside records available from external health systems.  Review of Systems:  A 10 point review of systems is negative, except for the pertinent positives and negatives detailed in the HPI.  Past Medical History: Past Medical History:  Diagnosis Date   Anxiety    Breast mass x 3 mos   felt by MD, pt does not feel it   Bulging lumbar disc    Seen Pain Clinic at [facility]  Chronic bronchitis (HCC)    Depression    Seeing Mental Health Professional   Diabetes mellitus without complication (HCC)    Hypertension    Migraines    Partial tear of rotator cuff    Injections did not help   Pre-diabetes    On Metformin.    Past Surgical History: Past Surgical History:  Procedure Laterality Date   ANKLE SURGERY Left    BREAST CYST ASPIRATION Right age 32   OTHER SURGICAL HISTORY Left ankle surgery   SALPINGECTOMY Bilateral    TUBAL LIGATION Bilateral     Allergies: Allergies as of 09/14/2023   (No Known Allergies)    Medications:  Current Outpatient Medications:    albuterol  (PROAIR HFA) 108 (90 Base) MCG/ACT inhaler, INHALE 2 PUFFS INTO THE LUNGS EVERY FOUR TO SIX HOURS AS NEEDED FOR WHEEZING., Disp: 8.5 g, Rfl: 11   Aspirin-Salicylamide-Caffeine (BC HEADACHE POWDER PO), Take 1 packet by mouth as needed., Disp: , Rfl:    atorvastatin (LIPITOR) 20 MG tablet, Take 20 mg by mouth daily., Disp: , Rfl:    Capsaicin 0.1 % CREA, Apply topically., Disp: , Rfl:    cetirizine (ZYRTEC) 10 MG tablet, Take 10 mg by mouth daily., Disp: , Rfl:    cyclobenzaprine (FLEXERIL) 10 MG tablet, Take 10 mg by mouth 3 (three) times daily., Disp: , Rfl:    DEPO-PROVERA 150 MG/ML injection, Inject 150 mg into the muscle every 3 (three) months., Disp: , Rfl:    escitalopram (LEXAPRO) 20 MG tablet, Take 20 mg by mouth daily., Disp: , Rfl:    fluticasone (FLONASE) 50 MCG/ACT nasal spray, Place 2 sprays into both nostrils once daily for allergies., Disp: 16 g, Rfl: 11   gabapentin (NEURONTIN) 300 MG capsule, Take 1 capsule (300 mg total) by mouth 2 (two) times daily for 30 days. (Patient taking differently: Take 300 mg by mouth daily.), Disp: 60 capsule, Rfl: 0   glucose blood (RIGHTEST GS550 BLOOD GLUCOSE) test strip, Use as directed, Disp: 100 each, Rfl: 11   ibuprofen (ADVIL,MOTRIN) 600 MG tablet, Take 600 mg by mouth every 6 (six) hours as needed for cramping., Disp: , Rfl:    JARDIANCE 10 MG TABS tablet, Take 10 mg by mouth every morning., Disp: , Rfl:    lisinopril (ZESTRIL) 10 MG tablet, Take 10 mg by mouth daily., Disp: , Rfl:    meclizine (ANTIVERT) 25 MG tablet, Take by mouth., Disp: , Rfl:    methylPREDNISolone (MEDROL DOSEPAK) 4 MG TBPK tablet, Take by mouth., Disp: , Rfl:    nortriptyline (PAMELOR) 10 MG capsule, Take 10 mg (1 capsule) at bedtime for 1 week, then increase to 20 mg (2 capsules) at bedtime and continue this dose, Disp: , Rfl:    omeprazole (PRILOSEC) 20 MG capsule, TAKE 1 CAPSULE BY MOUTH ONCE A DAY FOR REFLUX AND COUGH., Disp: 90 capsule, Rfl: 3   ondansetron  (ZOFRAN-ODT) 4 MG disintegrating tablet, DISSOLVE 2 TABLETS (8 MG) BY MOUTH ONCE EVERY 8 HOURS AS NEEDED FOR NAUSEA, Disp: 20 tablet, Rfl: 11   propranolol ER (INDERAL LA) 120 MG 24 hr capsule, TAKE 1 CAPSULE BY MOUTH EVERY DAY AT BEDTIME., Disp: 90 capsule, Rfl: 3   QUEtiapine (SEROQUEL) 25 MG tablet, Take 25 mg by mouth at bedtime., Disp: , Rfl:    Rightest GL300 Lancets MISC, USE AS DIRECTED, Disp: 100 each, Rfl: 0   sitaGLIPtin-metformin (JANUMET) 50-500 MG tablet, Take 1 tablet by mouth 2 (two) times daily with a  meal., Disp: , Rfl:    SUMAtriptan (IMITREX) 50 MG tablet, SMARTSIG:1 Tablet(s) By Mouth, Disp: , Rfl:    traZODone (DESYREL) 50 MG tablet, Take 25-50 mg by mouth at bedtime as needed., Disp: , Rfl:    Vitamin D, Ergocalciferol, (DRISDOL) 1.25 MG (50000 UNIT) CAPS capsule, Take 50,000 Units by mouth once a week., Disp: , Rfl:   Current Facility-Administered Medications:    ivabradine (CORLANOR) tablet 15 mg, 15 mg, Oral, Once,   Social History: Social History   Tobacco Use   Smoking status: Every Day    Current packs/day: 0.25    Average packs/day: 0.3 packs/day for 15.0 years (3.8 ttl pk-yrs)    Types: Cigarettes   Smokeless tobacco: Never   Tobacco comments:    cutting back  Vaping Use   Vaping status: Never Used  Substance Use Topics   Alcohol use: Yes    Comment: Occasionally during Holidays   Drug use: No    Family Medical History: Family History  Problem Relation Age of Onset   Depression Mother    Hypertension Mother    Diabetes Mother    Heart failure Mother    Hypertension Father    Hypertension Sister    Depression Brother    Cancer Maternal Grandmother    Cancer Paternal Grandmother    Breast cancer Neg Hx     Physical Examination: There were no vitals filed for this visit.  General: Patient is in no apparent distress. Attention to examination is appropriate.  Neck:   Supple.  Full range of motion.  Respiratory: Patient is breathing  without any difficulty.   NEUROLOGICAL:     Awake, alert, oriented to person, place, and time.  Speech is clear and fluent.   Cranial Nerves: Pupils equal round and reactive to light.  Facial tone is symmetric.  Facial sensation is symmetric. Shoulder shrug is symmetric. Tongue protrusion is midline.    Strength: Side Biceps Triceps Deltoid Interossei Grip Wrist Ext. Wrist Flex.  R 5 5 5 5 5 5 5   L 5 5 5 5 5 5 5    Side Iliopsoas Quads Hamstring PF DF EHL  R 5 5 5 5 5 5   L 5 5 5 5 5 5    Reflexes are ***2+ and symmetric at the biceps, triceps, brachioradialis, patella and achilles.   Hoffman's is absent. Clonus is absent  Bilateral upper and lower extremity sensation is intact to light touch ***.     No evidence of dysmetria noted.  Gait is normal.    Imaging: *** I have personally reviewed the images and agree with the above interpretation.  Medical Decision Making/Assessment and Plan: Ms. Decoteau is a pleasant 47 y.o. female with ***  There are no diagnoses linked to this encounter.   Thank you for involving me in the care of this patient.    Lovenia Kim MD/MSCR Neurosurgery

## 2023-09-14 ENCOUNTER — Ambulatory Visit: Payer: MEDICAID | Admitting: Neurosurgery

## 2023-09-23 NOTE — Progress Notes (Deleted)
Referring Physician:  Sandrea Hughs, NP [address] [place],  [place] [address]  Primary Physician:  Sandrea Hughs, NP  History of Present Illness: 09/23/2023 [name] is here today with a chief complaint of ***  Neck and bilateral arm pain that goes into her hands.  Numbness and tingling?   History of Present Illness: 09/02/2023 note from Drake Leach, PA-C Brenda Simmons is a 47 y.o. female has a history of migraines, chronic pain, HTN, DM.    Last seen by me on 05/26/23 for chronic back/leg pain and neck/bilateral arm pain. She has known moderate DDD/spondylosis C4-C7 with anterior spurring. MRI from last year shows mild spinal stenosis C3-C7 with multilevel foraminal stenosis that is severe at C5-C6.    She also has a right shoulder rotator cuff tear- she had painful limited ROM of her right shoulder at her last visit.    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 MRI from last year shows diffuse lumbar spondylosis with no compression noted.    She was sent to PT at her last visit- had initial evaluation on 06/28/23 with 4 additional visits (last on 08/09/23). She does not think that it helped.    Lance Bosch had right C7-T1 IL ESI on 06/16/23 and was referred to ortho for her right shoulder (did not see them). ESI only helped short term (for a couple hours).    Phone visit scheduled to review her cervical and lumbar MRI scans.    She has constant neck pain with radiation into both shoulders and down right > left arm into her hand (mostly into pointer finger). Neck pain > arm pain. She has weakness in right > left hand. She has numbness and tingling as well. She continues with pain in her right shoulder. No dexterity or balance issues.   She has constant back pain with bilateral leg pain posterior to her feet that is worse with prolonged standing and walking. 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 She has weakness  in both legs.    She is taking flexeril, motrin, and neurontin. Not sure these help much.    She smokes 1/2 ppd x 25 years.    She's only had short term injections with both cervical and lumbar injections.    Bowel/Bladder Dysfunction: none. She has some urinary urgency.    Conservative measures:  Physical therapy: none   Multimodal medical therapy including regular antiinflammatories: flexeril, neurontin, motrin,   Injections:  Right C7-T1 IL ESI 06/16/23 Right C7-T1 IL ESI 05/17/23 ( relief 1-2 days )  Diagnostic bilateral sacroiliac joint Blk x1  (no relief)  Diagnostic bilateral (L4-5, L5-S1) lumbar facet (L3-S1) MBB x1 ( no relief)    Past Surgery: no spinal surgery    Brenda Simmons has no symptoms of cervical myelopathy. She takes a medication for vertigo.      Brenda Simmons has ***no symptoms of cervical myelopathy.  The symptoms are causing a significant impact on the patient's life.   I have utilized the care everywhere function in epic to review the outside records available from external health systems.  Review of Systems:  A 10 point review of systems is negative, except for the pertinent positives and negatives detailed in the HPI.  Past Medical History: Past Medical History:  Diagnosis Date   Anxiety    Breast mass x 3 mos   felt by MD, pt does not feel  it   Bulging lumbar disc    Seen [facility] Clinic at [facility]   Chronic bronchitis [facility])    Depression    Seeing Mental Health Professional   Diabetes mellitus without complication (HCC)    Hypertension    Migraines    Partial tear of rotator cuff    Injections did not help   Pre-diabetes    On Metformin.    Past Surgical History: Past Surgical History:  Procedure Laterality Date   ANKLE SURGERY Left    BREAST CYST ASPIRATION Right age 56   OTHER SURGICAL HISTORY Left ankle surgery   SALPINGECTOMY Bilateral    TUBAL LIGATION Bilateral     Allergies: Allergies as of 09/26/2023   (No Known  Allergies)    Medications:  Current Outpatient Medications:    albuterol (PROAIR HFA) 108 (90 Base) MCG/ACT inhaler, INHALE 2 PUFFS INTO THE LUNGS EVERY FOUR TO SIX HOURS AS NEEDED FOR WHEEZING., Disp: 8.5 g, Rfl: 11   Aspirin-Salicylamide-Caffeine (BC HEADACHE POWDER PO), Take 1 packet by mouth as needed., Disp: , Rfl:    atorvastatin (LIPITOR) 20 MG tablet, Take 20 mg by mouth daily., Disp: , Rfl:    Capsaicin 0.1 % CREA, Apply topically., Disp: , Rfl:    cetirizine (ZYRTEC) 10 MG tablet, Take 10 mg by mouth daily., Disp: , Rfl:    cyclobenzaprine (FLEXERIL) 10 MG tablet, Take 10 mg by mouth 3 (three) times daily., Disp: , Rfl:    DEPO-PROVERA 150 MG/ML injection, Inject 150 mg into the muscle every 3 (three) months., Disp: , Rfl:    escitalopram (LEXAPRO) 20 MG tablet, Take 20 mg by mouth daily., Disp: , Rfl:    fluticasone (FLONASE) 50 MCG/ACT nasal spray, Place 2 sprays into both nostrils once daily for allergies., Disp: 16 g, Rfl: 11   gabapentin (NEURONTIN) 300 MG capsule, Take 1 capsule (300 mg total) by mouth 2 (two) times daily for 30 days. (Patient taking differently: Take 300 mg by mouth daily.), Disp: 60 capsule, Rfl: 0   glucose blood (RIGHTEST GS550 BLOOD GLUCOSE) test strip, Use as directed, Disp: 100 each, Rfl: 11   ibuprofen (ADVIL,MOTRIN) 600 MG tablet, Take 600 mg by mouth every 6 (six) hours as needed for cramping., Disp: , Rfl:    JARDIANCE 10 MG TABS tablet, Take 10 mg by mouth every morning., Disp: , Rfl:    lisinopril (ZESTRIL) 10 MG tablet, Take 10 mg by mouth daily., Disp: , Rfl:    meclizine (ANTIVERT) 25 MG tablet, Take by mouth., Disp: , Rfl:    methylPREDNISolone (MEDROL DOSEPAK) 4 MG TBPK tablet, Take by mouth., Disp: , Rfl:    nortriptyline (PAMELOR) 10 MG capsule, Take 10 mg (1 capsule) at bedtime for 1 week, then increase to 20 mg (2 capsules) at bedtime and continue this dose, Disp: , Rfl:    omeprazole (PRILOSEC) 20 MG capsule, TAKE 1 CAPSULE BY MOUTH ONCE  A DAY FOR REFLUX AND COUGH., Disp: 90 capsule, Rfl: 3   ondansetron (ZOFRAN-ODT) 4 MG disintegrating tablet, DISSOLVE 2 TABLETS (8 MG) BY MOUTH ONCE EVERY 8 HOURS AS NEEDED FOR NAUSEA, Disp: 20 tablet, Rfl: 11   propranolol ER (INDERAL LA) 120 MG 24 hr capsule, TAKE 1 CAPSULE BY MOUTH EVERY DAY AT BEDTIME., Disp: 90 capsule, Rfl: 3   QUEtiapine (SEROQUEL) 25 MG tablet, Take 25 mg by mouth at bedtime., Disp: , Rfl:    Rightest GL300 Lancets MISC, USE AS DIRECTED, Disp: 100 each, Rfl: 0  sitaGLIPtin-metformin (JANUMET) 50-500 MG tablet, Take 1 tablet by mouth 2 (two) times daily with a meal., Disp: , Rfl:    SUMAtriptan (IMITREX) 50 MG tablet, SMARTSIG:1 Tablet(s) By Mouth, Disp: , Rfl:    traZODone (DESYREL) 50 MG tablet, Take 25-50 mg by mouth at bedtime as needed., Disp: , Rfl:    Vitamin D, Ergocalciferol, (DRISDOL) 1.25 MG (50000 UNIT) CAPS capsule, Take 50,000 Units by mouth once a week., Disp: , Rfl:   Current Facility-Administered Medications:    ivabradine (CORLANOR) tablet 15 mg, 15 mg, Oral, Once,   Social History: Social History   Tobacco Use   Smoking status: Every Day    Current packs/day: 0.25    Average packs/day: 0.3 packs/day for 15.0 years (3.8 ttl pk-yrs)    Types: Cigarettes   Smokeless tobacco: Never   Tobacco comments:    cutting back  Vaping Use   Vaping status: Never Used  Substance Use Topics   Alcohol use: Yes    Comment: Occasionally during Holidays   Drug use: No    Family Medical History: Family History  Problem Relation Age of Onset   Depression Mother    Hypertension Mother    Diabetes Mother    Heart failure Mother    Hypertension Father    Hypertension Sister    Depression Brother    Cancer Maternal Grandmother    Cancer Paternal Grandmother    Breast cancer Neg Hx     Physical Examination: There were no vitals filed for this visit.  General: Patient is in no apparent distress. Attention to examination is appropriate.  Neck:    Supple.  Full range of motion.  Respiratory: Patient is breathing without any difficulty.   NEUROLOGICAL:     Awake, alert, oriented to person, place, and time.  Speech is clear and fluent.   Cranial Nerves: Pupils equal round and reactive to light.  Facial tone is symmetric.  Facial sensation is symmetric. Shoulder shrug is symmetric. Tongue protrusion is midline.    Strength: Side Biceps Triceps Deltoid Interossei Grip Wrist Ext. Wrist Flex.  R 5 5 5 5 5 5 5   L 5 5 5 5 5 5 5    Side Iliopsoas Quads Hamstring PF DF EHL  R 5 5 5 5 5 5   L 5 5 5 5 5 5    Reflexes are ***2+ and symmetric at the biceps, triceps, brachioradialis, patella and achilles.   Hoffman's is absent. Clonus is absent  Bilateral upper and lower extremity sensation is intact to light touch ***.     No evidence of dysmetria noted.  Gait is normal.    Imaging: *** I have personally reviewed the images and agree with the above interpretation.  Medical Decision Making/Assessment and Plan: Brenda Simmons is a pleasant 47 y.o. female with ***  There are no diagnoses linked to this encounter.   Thank you for involving me in the care of this patient.    Lovenia Kim MD/MSCR Neurosurgery

## 2023-09-26 ENCOUNTER — Ambulatory Visit: Payer: MEDICAID | Admitting: Neurosurgery

## 2024-02-13 ENCOUNTER — Other Ambulatory Visit: Payer: Self-pay | Admitting: Physician Assistant

## 2024-02-13 DIAGNOSIS — R1011 Right upper quadrant pain: Secondary | ICD-10-CM

## 2024-02-13 DIAGNOSIS — R748 Abnormal levels of other serum enzymes: Secondary | ICD-10-CM

## 2024-02-16 ENCOUNTER — Ambulatory Visit
Admission: RE | Admit: 2024-02-16 | Discharge: 2024-02-16 | Disposition: A | Discharge status: Home / Self Care | Payer: MEDICAID | Source: Ambulatory Visit | Attending: Physician Assistant | Admitting: Physician Assistant

## 2024-02-16 ENCOUNTER — Other Ambulatory Visit: Payer: Self-pay | Admitting: Physician Assistant

## 2024-02-16 DIAGNOSIS — Z1231 Encounter for screening mammogram for malignant neoplasm of breast: Secondary | ICD-10-CM

## 2024-02-16 DIAGNOSIS — R748 Abnormal levels of other serum enzymes: Secondary | ICD-10-CM | POA: Diagnosis present

## 2024-02-16 DIAGNOSIS — R1011 Right upper quadrant pain: Secondary | ICD-10-CM | POA: Diagnosis present

## 2024-02-16 DIAGNOSIS — N644 Mastodynia: Secondary | ICD-10-CM

## 2024-02-22 ENCOUNTER — Ambulatory Visit
Admission: RE | Admit: 2024-02-22 | Discharge: 2024-02-22 | Disposition: A | Discharge status: Home / Self Care | Payer: MEDICAID | Source: Ambulatory Visit | Attending: Physician Assistant | Admitting: Physician Assistant

## 2024-02-22 DIAGNOSIS — N644 Mastodynia: Secondary | ICD-10-CM | POA: Diagnosis present

## 2024-02-22 DIAGNOSIS — Z1231 Encounter for screening mammogram for malignant neoplasm of breast: Secondary | ICD-10-CM | POA: Insufficient documentation

## 2024-04-11 ENCOUNTER — Other Ambulatory Visit: Payer: Self-pay | Admitting: Physician Assistant

## 2024-04-11 DIAGNOSIS — R109 Unspecified abdominal pain: Secondary | ICD-10-CM

## 2024-04-30 ENCOUNTER — Ambulatory Visit
Admission: RE | Admit: 2024-04-30 | Discharge: 2024-04-30 | Disposition: A | Discharge status: Home / Self Care | Payer: MEDICAID | Source: Ambulatory Visit | Attending: Physician Assistant | Admitting: Physician Assistant

## 2024-04-30 DIAGNOSIS — R109 Unspecified abdominal pain: Secondary | ICD-10-CM | POA: Insufficient documentation

## 2024-04-30 LAB — POCT I-STAT CREATININE: Creatinine, Ser: 0.8 mg/dL (ref 0.44–1.00)

## 2024-04-30 MED ORDER — IOHEXOL 300 MG/ML  SOLN
100.0000 mL | Freq: Once | INTRAMUSCULAR | Status: AC | PRN
  Administered 2024-04-30: 100 mL via INTRAVENOUS

## 2024-05-29 ENCOUNTER — Other Ambulatory Visit: Payer: Self-pay

## 2024-05-29 ENCOUNTER — Emergency Department
Admission: EM | Admit: 2024-05-29 | Discharge: 2024-05-29 | Disposition: A | Discharge status: Home / Self Care | Payer: MEDICAID | Attending: Emergency Medicine | Admitting: Emergency Medicine

## 2024-05-29 DIAGNOSIS — E871 Hypo-osmolality and hyponatremia: Secondary | ICD-10-CM | POA: Diagnosis not present

## 2024-05-29 DIAGNOSIS — I10 Essential (primary) hypertension: Secondary | ICD-10-CM | POA: Insufficient documentation

## 2024-05-29 DIAGNOSIS — D72829 Elevated white blood cell count, unspecified: Secondary | ICD-10-CM | POA: Insufficient documentation

## 2024-05-29 DIAGNOSIS — M5441 Lumbago with sciatica, right side: Secondary | ICD-10-CM | POA: Insufficient documentation

## 2024-05-29 DIAGNOSIS — M545 Low back pain, unspecified: Secondary | ICD-10-CM | POA: Diagnosis present

## 2024-05-29 DIAGNOSIS — E119 Type 2 diabetes mellitus without complications: Secondary | ICD-10-CM | POA: Diagnosis not present

## 2024-05-29 LAB — URINALYSIS, ROUTINE W REFLEX MICROSCOPIC
Bacteria, UA: NONE SEEN
Bilirubin Urine: NEGATIVE
Glucose, UA: NEGATIVE mg/dL
Hgb urine dipstick: NEGATIVE
Ketones, ur: NEGATIVE mg/dL
Nitrite: NEGATIVE
Protein, ur: NEGATIVE mg/dL
Specific Gravity, Urine: 1.021 (ref 1.005–1.030)
pH: 5 (ref 5.0–8.0)

## 2024-05-29 LAB — COMPREHENSIVE METABOLIC PANEL WITH GFR
ALT: 13 U/L (ref 0–44)
AST: 13 U/L — ABNORMAL LOW (ref 15–41)
Albumin: 3.2 g/dL — ABNORMAL LOW (ref 3.5–5.0)
Alkaline Phosphatase: 101 U/L (ref 38–126)
Anion gap: 10 (ref 5–15)
BUN: 9 mg/dL (ref 6–20)
CO2: 22 mmol/L (ref 22–32)
Calcium: 8.7 mg/dL — ABNORMAL LOW (ref 8.9–10.3)
Chloride: 102 mmol/L (ref 98–111)
Creatinine, Ser: 0.62 mg/dL (ref 0.44–1.00)
GFR, Estimated: >60 mL/min (ref >60)
Glucose, Bld: 205 mg/dL — ABNORMAL HIGH (ref 70–99)
Potassium: 3.7 mmol/L (ref 3.5–5.1)
Sodium: 134 mmol/L — ABNORMAL LOW (ref 135–145)
Total Bilirubin: 0.6 mg/dL (ref 0.0–1.2)
Total Protein: 6.5 g/dL (ref 6.5–8.1)

## 2024-05-29 LAB — POC URINE PREG, ED: Preg Test, Ur: NEGATIVE

## 2024-05-29 LAB — CBC
HCT: 37.6 % (ref 36.0–46.0)
Hemoglobin: 12.4 g/dL (ref 12.0–15.0)
MCH: 29.1 pg (ref 26.0–34.0)
MCHC: 33 g/dL (ref 30.0–36.0)
MCV: 88.3 fL (ref 80.0–100.0)
Platelets: 438 K/uL — ABNORMAL HIGH (ref 150–400)
RBC: 4.26 MIL/uL (ref 3.87–5.11)
RDW: 12.4 % (ref 11.5–15.5)
WBC: 11.9 K/uL — ABNORMAL HIGH (ref 4.0–10.5)
nRBC: 0 % (ref 0.0–0.2)

## 2024-05-29 LAB — LIPASE, BLOOD: Lipase: 40 U/L (ref 11–51)

## 2024-05-29 MED ORDER — LIDOCAINE 5 % EX PTCH: 1.0000 | MEDICATED_PATCH | CUTANEOUS | 0 refills | Status: AC

## 2024-05-29 MED ORDER — KETOROLAC TROMETHAMINE 30 MG/ML IJ SOLN
15.0000 mg | Freq: Once | INTRAMUSCULAR | Status: AC
  Administered 2024-05-29: 15 mg via INTRAMUSCULAR
  Filled 2024-05-29: qty 1

## 2024-05-29 MED ORDER — ACETAMINOPHEN 500 MG PO TABS
1000.0000 mg | ORAL_TABLET | Freq: Once | ORAL | Status: AC
  Administered 2024-05-29: 1000 mg via ORAL
  Filled 2024-05-29: qty 2

## 2024-05-29 MED ORDER — LIDOCAINE 5 % EX PTCH
1.0000 | MEDICATED_PATCH | CUTANEOUS | Status: DC
  Administered 2024-05-29: 1 via TRANSDERMAL
  Filled 2024-05-29: qty 1

## 2024-05-29 NOTE — ED Triage Notes (Signed)
 Pt comes in via pov with complaints of right side pain the past 5 months. Pt states that the last two days the pain has gotten worse. The pain in sharp and comes and goes, and complains of pain 9/10.

## 2024-05-29 NOTE — Discharge Instructions (Addendum)
 You can take 650 mg of Tylenol  or 400 mg ibuprofen  every 6 hours as needed for pain.  I have also prescribed you Lidoderm  patches.

## 2024-05-29 NOTE — ED Provider Notes (Signed)
 Brenda Simmons Provider Note    Event Date/Time   First MD Initiated Contact with Patient 05/29/24 2130     (approximate)   History   Flank Pain   HPI  Brenda Simmons is a 48 y.o. female with history of chronic back pain, chronic headache, chronic pain syndrome, depression, diabetes, hypertension, presenting with right lower back pain that radiates down her leg.  States that symptoms have been intermittent for a while.  States that in the past she has had flank pain and abdominal pain, has had CAT scans and ultrasounds in the past that showed fatty liver and diverticulosis.  States that the pain that she feels now is different from that.  She denies any weakness or numbness, no saddle anesthesia or incontinence, no fever or urinary symptoms.  States no prior history of kidney stones.  On independent chart review, she had a CT done in early August that did not show any acute process, showed diverticulosis.     Physical Exam   Triage Vital Signs: ED Triage Vitals  Encounter Vitals Group     BP 05/29/24 1858 (!) 156/94     Girls Systolic BP Percentile --      Girls Diastolic BP Percentile --      Boys Systolic BP Percentile --      Boys Diastolic BP Percentile --      Pulse Rate 05/29/24 1858 84     Resp 05/29/24 1858 18     Temp 05/29/24 1858 98.7 F (37.1 C)     Temp src --      SpO2 05/29/24 1858 96 %     Weight 05/29/24 1859 214 lb (97.1 kg)     Height 05/29/24 1859 5' 4 (1.626 m)     Head Circumference --      Peak Flow --      Pain Score 05/29/24 1859 9     Pain Loc --      Pain Education --      Exclude from Growth Chart --     Most recent vital signs: Vitals:   05/29/24 1858 05/29/24 2210  BP: (!) 156/94   Pulse: 84 80  Resp: 18   Temp: 98.7 F (37.1 C)   SpO2: 96% 95%     General: Awake, no distress.  CV:  Good peripheral perfusion.  Resp:  Normal effort.  Abd:  No distention.  Soft nontender Other:  No flank or CVA  tenderness, she has mild right lower paralumbar tenderness, straight leg test is positive.  She has no focal weakness or numbness, no midline spinal tenderness, no saddle anesthesia.   ED Results / Procedures / Treatments   Labs (all labs ordered are listed, but only abnormal results are displayed) Labs Reviewed  COMPREHENSIVE METABOLIC PANEL WITH GFR - Abnormal; Notable for the following components:      Result Value   Sodium 134 (*)    Glucose, Bld 205 (*)    Calcium  8.7 (*)    Albumin 3.2 (*)    AST 13 (*)    All other components within normal limits  CBC - Abnormal; Notable for the following components:   WBC 11.9 (*)    Platelets 438 (*)    All other components within normal limits  URINALYSIS, ROUTINE W REFLEX MICROSCOPIC - Abnormal; Notable for the following components:   Color, Urine YELLOW (*)    APPearance HAZY (*)    Leukocytes,Ua TRACE (*)  All other components within normal limits  LIPASE, BLOOD  POC URINE PREG, ED      PROCEDURES:  Critical Care performed: No  Procedures   MEDICATIONS ORDERED IN ED: Medications  lidocaine  (LIDODERM ) 5 % 1 patch (1 patch Transdermal Patch Applied 05/29/24 2210)  acetaminophen  (TYLENOL ) tablet 1,000 mg (1,000 mg Oral Given 05/29/24 2209)  ketorolac  (TORADOL ) 30 MG/ML injection 15 mg (15 mg Intramuscular Given 05/29/24 2209)     IMPRESSION / MDM / ASSESSMENT AND PLAN / ED COURSE  I reviewed the triage vital signs and the nursing notes.                              Differential diagnosis includes, but is not limited to, sciatica, consider cauda equina but she has no saddle anesthesia or incontinence, no focal weakness or numbness.  Musculoskeletal pain, strain, consider kidney stone but patient states that her pain feels musculoskeletal and radiates down her leg.  No hematuria, no prior history of kidney stones.  Also considered UTI but she has no urinary symptoms or CVA tenderness at this time.  Discussed with her about  imaging and she would like to hold off at this time.  Will symptomatically treat her with Tylenol , ibuprofen , Lidoderm  patch.  Labs were obtained at triage including a UA and pregnancy test.  Patient's presentation is most consistent with acute presentation with potential threat to life or bodily function.  Independent interpretation of labs below.  Labs are reassuring.  Will discharge patient with outpatient primary care follow-up, will also put in number for her to call for neurosurgery because she wants to follow-up with them.  Otherwise considered but no indication for inpatient admission at this time, she safe for outpatient management.  Will discharge with Lidoderm  patches as well.  Shared decision making outpatient and she is agreeable with this plan.  Strict return precautions given.  Discharge.    Clinical Course as of 05/29/24 2211  Tue May 29, 2024  2206 Independent review of labs, pregnancy test is negative, electrolytes are severely deranged, LFTs are not elevated, UA is not consistent with a UTI. [TT]    Clinical Course User Index [TT] Waymond Lorelle Cummins, MD     FINAL CLINICAL IMPRESSION(S) / ED DIAGNOSES   Final diagnoses:  Acute right-sided low back pain with right-sided sciatica     Rx / DC Orders   ED Discharge Orders          Ordered    lidocaine  (LIDODERM ) 5 %  Every 24 hours        05/29/24 2210             Note:  This document was prepared using Dragon voice recognition software and may include unintentional dictation errors.    Waymond Lorelle Cummins, MD 05/29/24 [phone]
